# Patient Record
Sex: Male | Born: 1960
Health system: Southern US, Community
[De-identification: ages and names within clinical notes are randomized; demographics above are authoritative.]

## PROBLEM LIST (undated history)

## (undated) DIAGNOSIS — T883XXA Malignant hyperthermia due to anesthesia, initial encounter: Secondary | ICD-10-CM

## (undated) DIAGNOSIS — Z21 Asymptomatic human immunodeficiency virus [HIV] infection status: Secondary | ICD-10-CM

## (undated) DIAGNOSIS — F5104 Psychophysiologic insomnia: Secondary | ICD-10-CM

## (undated) DIAGNOSIS — I10 Essential (primary) hypertension: Secondary | ICD-10-CM

## (undated) DIAGNOSIS — B1920 Unspecified viral hepatitis C without hepatic coma: Secondary | ICD-10-CM

## (undated) DIAGNOSIS — Z5189 Encounter for other specified aftercare: Secondary | ICD-10-CM

## (undated) DIAGNOSIS — R569 Unspecified convulsions: Secondary | ICD-10-CM

## (undated) DIAGNOSIS — B20 Human immunodeficiency virus [HIV] disease: Secondary | ICD-10-CM

## (undated) DIAGNOSIS — N289 Disorder of kidney and ureter, unspecified: Secondary | ICD-10-CM

## (undated) HISTORY — DX: Unspecified convulsions: R56.9

## (undated) HISTORY — DX: Human immunodeficiency virus (HIV) disease: B20

## (undated) HISTORY — DX: Unspecified viral hepatitis C without hepatic coma: B19.20

## (undated) HISTORY — DX: Encounter for other specified aftercare: Z51.89

## (undated) HISTORY — DX: Psychophysiologic insomnia: F51.04

## (undated) HISTORY — PX: AV FISTULA PLACEMENT: SHX1204

## (undated) HISTORY — DX: Essential (primary) hypertension: I10

## (undated) HISTORY — PX: NEPHRECTOMY TRANSPLANTED ORGAN: SUR880

## (undated) HISTORY — DX: Asymptomatic human immunodeficiency virus (hiv) infection status: Z21

---

## 1998-02-06 ENCOUNTER — Emergency Department (HOSPITAL_COMMUNITY): Admission: EM | Admit: 1998-02-06 | Discharge: 1998-02-06 | Payer: Self-pay | Admitting: Emergency Medicine

## 1998-04-17 ENCOUNTER — Encounter: Payer: Self-pay | Admitting: Internal Medicine

## 1998-04-17 ENCOUNTER — Ambulatory Visit (HOSPITAL_COMMUNITY): Admission: RE | Admit: 1998-04-17 | Discharge: 1998-04-17 | Payer: Self-pay | Admitting: Internal Medicine

## 1999-05-10 DIAGNOSIS — B20 Human immunodeficiency virus [HIV] disease: Secondary | ICD-10-CM | POA: Insufficient documentation

## 1999-08-18 ENCOUNTER — Encounter: Admission: RE | Admit: 1999-08-18 | Discharge: 1999-08-18 | Payer: Self-pay | Admitting: Infectious Diseases

## 1999-08-18 ENCOUNTER — Ambulatory Visit (HOSPITAL_COMMUNITY): Admission: RE | Admit: 1999-08-18 | Discharge: 1999-08-18 | Payer: Self-pay | Admitting: Infectious Diseases

## 1999-08-18 ENCOUNTER — Encounter (INDEPENDENT_AMBULATORY_CARE_PROVIDER_SITE_OTHER): Payer: Self-pay | Admitting: *Deleted

## 1999-08-25 ENCOUNTER — Encounter: Admission: RE | Admit: 1999-08-25 | Discharge: 1999-08-25 | Payer: Self-pay | Admitting: Internal Medicine

## 1999-10-05 ENCOUNTER — Encounter: Admission: RE | Admit: 1999-10-05 | Discharge: 1999-10-05 | Payer: Self-pay | Admitting: Internal Medicine

## 1999-10-05 ENCOUNTER — Ambulatory Visit (HOSPITAL_COMMUNITY): Admission: RE | Admit: 1999-10-05 | Discharge: 1999-10-05 | Payer: Self-pay | Admitting: Internal Medicine

## 1999-10-08 DIAGNOSIS — I6789 Other cerebrovascular disease: Secondary | ICD-10-CM | POA: Insufficient documentation

## 1999-10-11 ENCOUNTER — Ambulatory Visit (HOSPITAL_COMMUNITY): Admission: RE | Admit: 1999-10-11 | Discharge: 1999-10-11 | Payer: Self-pay | Admitting: Vascular Surgery

## 1999-10-11 ENCOUNTER — Encounter: Payer: Self-pay | Admitting: Vascular Surgery

## 1999-10-14 ENCOUNTER — Encounter: Payer: Self-pay | Admitting: Internal Medicine

## 1999-10-14 ENCOUNTER — Inpatient Hospital Stay (HOSPITAL_COMMUNITY): Admission: EM | Admit: 1999-10-14 | Discharge: 1999-10-23 | Payer: Self-pay | Admitting: Emergency Medicine

## 1999-10-14 ENCOUNTER — Encounter: Payer: Self-pay | Admitting: Emergency Medicine

## 1999-10-15 ENCOUNTER — Encounter: Payer: Self-pay | Admitting: Internal Medicine

## 1999-10-16 ENCOUNTER — Encounter: Payer: Self-pay | Admitting: Internal Medicine

## 1999-10-19 ENCOUNTER — Encounter: Payer: Self-pay | Admitting: Internal Medicine

## 1999-10-19 ENCOUNTER — Encounter: Payer: Self-pay | Admitting: Thoracic Surgery

## 1999-10-21 ENCOUNTER — Encounter: Payer: Self-pay | Admitting: Internal Medicine

## 1999-10-25 ENCOUNTER — Encounter: Admission: RE | Admit: 1999-10-25 | Discharge: 1999-10-25 | Payer: Self-pay | Admitting: Internal Medicine

## 1999-10-25 ENCOUNTER — Ambulatory Visit (HOSPITAL_COMMUNITY): Admission: RE | Admit: 1999-10-25 | Discharge: 1999-10-25 | Payer: Self-pay | Admitting: Internal Medicine

## 1999-11-08 ENCOUNTER — Encounter: Admission: RE | Admit: 1999-11-08 | Discharge: 1999-11-08 | Payer: Self-pay | Admitting: Internal Medicine

## 1999-11-15 ENCOUNTER — Encounter: Admission: RE | Admit: 1999-11-15 | Discharge: 2000-02-13 | Payer: Self-pay | Admitting: Internal Medicine

## 1999-12-03 ENCOUNTER — Ambulatory Visit (HOSPITAL_COMMUNITY): Admission: RE | Admit: 1999-12-03 | Discharge: 1999-12-03 | Payer: Self-pay | Admitting: Vascular Surgery

## 1999-12-10 ENCOUNTER — Encounter: Admission: RE | Admit: 1999-12-10 | Discharge: 1999-12-10 | Payer: Self-pay | Admitting: Internal Medicine

## 1999-12-29 ENCOUNTER — Encounter: Admission: RE | Admit: 1999-12-29 | Discharge: 1999-12-29 | Payer: Self-pay | Admitting: Hematology and Oncology

## 2000-01-04 ENCOUNTER — Ambulatory Visit (HOSPITAL_COMMUNITY): Admission: RE | Admit: 2000-01-04 | Discharge: 2000-01-04 | Payer: Self-pay | Admitting: Infectious Diseases

## 2000-01-04 ENCOUNTER — Encounter: Admission: RE | Admit: 2000-01-04 | Discharge: 2000-01-04 | Payer: Self-pay | Admitting: Internal Medicine

## 2000-01-25 ENCOUNTER — Encounter: Admission: RE | Admit: 2000-01-25 | Discharge: 2000-01-25 | Payer: Self-pay | Admitting: Internal Medicine

## 2000-02-10 ENCOUNTER — Emergency Department (HOSPITAL_COMMUNITY): Admission: EM | Admit: 2000-02-10 | Discharge: 2000-02-10 | Payer: Self-pay | Admitting: Emergency Medicine

## 2000-03-17 ENCOUNTER — Encounter: Admission: RE | Admit: 2000-03-17 | Discharge: 2000-03-17 | Payer: Self-pay | Admitting: Internal Medicine

## 2000-07-24 ENCOUNTER — Encounter: Admission: RE | Admit: 2000-07-24 | Discharge: 2000-07-24 | Payer: Self-pay | Admitting: Internal Medicine

## 2000-07-24 ENCOUNTER — Ambulatory Visit (HOSPITAL_COMMUNITY): Admission: RE | Admit: 2000-07-24 | Discharge: 2000-07-24 | Payer: Self-pay | Admitting: Internal Medicine

## 2000-10-17 ENCOUNTER — Ambulatory Visit (HOSPITAL_COMMUNITY): Admission: RE | Admit: 2000-10-17 | Discharge: 2000-10-17 | Payer: Self-pay | Admitting: Internal Medicine

## 2000-11-08 ENCOUNTER — Encounter: Admission: RE | Admit: 2000-11-08 | Discharge: 2000-11-08 | Payer: Self-pay | Admitting: Internal Medicine

## 2001-05-30 ENCOUNTER — Ambulatory Visit (HOSPITAL_COMMUNITY): Admission: RE | Admit: 2001-05-30 | Discharge: 2001-05-30 | Payer: Self-pay | Admitting: Internal Medicine

## 2001-05-30 ENCOUNTER — Encounter: Admission: RE | Admit: 2001-05-30 | Discharge: 2001-05-30 | Payer: Self-pay | Admitting: Internal Medicine

## 2001-08-06 ENCOUNTER — Encounter: Admission: RE | Admit: 2001-08-06 | Discharge: 2001-08-06 | Payer: Self-pay | Admitting: Infectious Diseases

## 2001-08-06 ENCOUNTER — Ambulatory Visit (HOSPITAL_COMMUNITY): Admission: RE | Admit: 2001-08-06 | Discharge: 2001-08-06 | Payer: Self-pay | Admitting: Internal Medicine

## 2001-10-23 ENCOUNTER — Encounter: Admission: RE | Admit: 2001-10-23 | Discharge: 2001-10-23 | Payer: Self-pay | Admitting: Internal Medicine

## 2001-10-23 ENCOUNTER — Ambulatory Visit (HOSPITAL_COMMUNITY): Admission: RE | Admit: 2001-10-23 | Discharge: 2001-10-23 | Payer: Self-pay | Admitting: Internal Medicine

## 2002-04-16 ENCOUNTER — Ambulatory Visit (HOSPITAL_COMMUNITY): Admission: RE | Admit: 2002-04-16 | Discharge: 2002-04-16 | Payer: Self-pay | Admitting: Internal Medicine

## 2002-04-16 ENCOUNTER — Encounter: Admission: RE | Admit: 2002-04-16 | Discharge: 2002-04-16 | Payer: Self-pay | Admitting: Internal Medicine

## 2002-05-09 HISTORY — PX: COLONOSCOPY: SHX174

## 2002-05-22 ENCOUNTER — Ambulatory Visit (HOSPITAL_COMMUNITY): Admission: RE | Admit: 2002-05-22 | Discharge: 2002-05-22 | Payer: Self-pay | Admitting: Gastroenterology

## 2002-06-14 ENCOUNTER — Encounter (INDEPENDENT_AMBULATORY_CARE_PROVIDER_SITE_OTHER): Payer: Self-pay | Admitting: Specialist

## 2002-06-14 ENCOUNTER — Encounter: Payer: Self-pay | Admitting: Surgery

## 2002-06-14 ENCOUNTER — Inpatient Hospital Stay (HOSPITAL_COMMUNITY): Admission: RE | Admit: 2002-06-14 | Discharge: 2002-06-18 | Payer: Self-pay | Admitting: Surgery

## 2002-08-08 DIAGNOSIS — Z9089 Acquired absence of other organs: Secondary | ICD-10-CM | POA: Insufficient documentation

## 2002-11-27 ENCOUNTER — Encounter: Payer: Self-pay | Admitting: Internal Medicine

## 2002-11-27 ENCOUNTER — Ambulatory Visit (HOSPITAL_COMMUNITY): Admission: RE | Admit: 2002-11-27 | Discharge: 2002-11-27 | Payer: Self-pay | Admitting: Internal Medicine

## 2002-11-27 ENCOUNTER — Encounter: Admission: RE | Admit: 2002-11-27 | Discharge: 2002-11-27 | Payer: Self-pay | Admitting: Internal Medicine

## 2002-12-17 ENCOUNTER — Encounter: Admission: RE | Admit: 2002-12-17 | Discharge: 2002-12-17 | Payer: Self-pay | Admitting: Internal Medicine

## 2002-12-23 ENCOUNTER — Emergency Department (HOSPITAL_COMMUNITY): Admission: EM | Admit: 2002-12-23 | Discharge: 2002-12-23 | Payer: Self-pay | Admitting: Emergency Medicine

## 2002-12-31 ENCOUNTER — Encounter: Admission: RE | Admit: 2002-12-31 | Discharge: 2002-12-31 | Payer: Self-pay | Admitting: Nephrology

## 2002-12-31 ENCOUNTER — Encounter: Payer: Self-pay | Admitting: Nephrology

## 2003-01-03 ENCOUNTER — Inpatient Hospital Stay (HOSPITAL_COMMUNITY): Admission: EM | Admit: 2003-01-03 | Discharge: 2003-01-04 | Payer: Self-pay | Admitting: Emergency Medicine

## 2003-01-07 ENCOUNTER — Encounter: Admission: RE | Admit: 2003-01-07 | Discharge: 2003-01-07 | Payer: Self-pay | Admitting: Nephrology

## 2003-01-07 ENCOUNTER — Encounter: Payer: Self-pay | Admitting: Nephrology

## 2003-01-08 ENCOUNTER — Encounter: Payer: Self-pay | Admitting: Nephrology

## 2003-01-08 ENCOUNTER — Encounter: Admission: RE | Admit: 2003-01-08 | Discharge: 2003-01-08 | Payer: Self-pay | Admitting: Nephrology

## 2003-07-31 ENCOUNTER — Ambulatory Visit (HOSPITAL_COMMUNITY): Admission: RE | Admit: 2003-07-31 | Discharge: 2003-07-31 | Payer: Self-pay | Admitting: Internal Medicine

## 2003-07-31 ENCOUNTER — Encounter: Admission: RE | Admit: 2003-07-31 | Discharge: 2003-07-31 | Payer: Self-pay | Admitting: Internal Medicine

## 2003-08-29 ENCOUNTER — Ambulatory Visit (HOSPITAL_COMMUNITY): Admission: RE | Admit: 2003-08-29 | Discharge: 2003-08-29 | Payer: Self-pay | Admitting: Nephrology

## 2004-03-30 ENCOUNTER — Ambulatory Visit: Payer: Self-pay | Admitting: Internal Medicine

## 2004-03-30 ENCOUNTER — Ambulatory Visit (HOSPITAL_COMMUNITY): Admission: RE | Admit: 2004-03-30 | Discharge: 2004-03-30 | Payer: Self-pay | Admitting: Internal Medicine

## 2004-03-30 ENCOUNTER — Encounter (INDEPENDENT_AMBULATORY_CARE_PROVIDER_SITE_OTHER): Payer: Self-pay | Admitting: *Deleted

## 2004-12-09 ENCOUNTER — Encounter (HOSPITAL_COMMUNITY): Admission: RE | Admit: 2004-12-09 | Discharge: 2005-03-09 | Payer: Self-pay | Admitting: Nephrology

## 2005-03-22 ENCOUNTER — Ambulatory Visit: Payer: Self-pay | Admitting: Internal Medicine

## 2005-03-22 ENCOUNTER — Encounter (INDEPENDENT_AMBULATORY_CARE_PROVIDER_SITE_OTHER): Payer: Self-pay | Admitting: *Deleted

## 2005-03-22 ENCOUNTER — Ambulatory Visit (HOSPITAL_COMMUNITY): Admission: RE | Admit: 2005-03-22 | Discharge: 2005-03-22 | Payer: Self-pay | Admitting: Internal Medicine

## 2005-03-22 LAB — CONVERTED CEMR LAB: HIV 1 RNA Quant: 399 copies/mL

## 2005-03-28 ENCOUNTER — Ambulatory Visit (HOSPITAL_COMMUNITY): Admission: RE | Admit: 2005-03-28 | Discharge: 2005-03-28 | Payer: Self-pay | Admitting: Nephrology

## 2005-09-15 ENCOUNTER — Encounter (INDEPENDENT_AMBULATORY_CARE_PROVIDER_SITE_OTHER): Payer: Self-pay | Admitting: *Deleted

## 2005-09-15 LAB — CONVERTED CEMR LAB
CD4 Count: 450 microliters
HIV 1 RNA Quant: 49 copies/mL

## 2005-10-18 ENCOUNTER — Ambulatory Visit: Payer: Self-pay | Admitting: Internal Medicine

## 2006-03-15 DIAGNOSIS — I10 Essential (primary) hypertension: Secondary | ICD-10-CM | POA: Insufficient documentation

## 2006-03-15 DIAGNOSIS — B1920 Unspecified viral hepatitis C without hepatic coma: Secondary | ICD-10-CM | POA: Insufficient documentation

## 2006-03-15 DIAGNOSIS — Z8619 Personal history of other infectious and parasitic diseases: Secondary | ICD-10-CM | POA: Insufficient documentation

## 2006-03-15 DIAGNOSIS — F1021 Alcohol dependence, in remission: Secondary | ICD-10-CM | POA: Insufficient documentation

## 2006-03-15 DIAGNOSIS — Z87891 Personal history of nicotine dependence: Secondary | ICD-10-CM | POA: Insufficient documentation

## 2006-03-15 DIAGNOSIS — R569 Unspecified convulsions: Secondary | ICD-10-CM | POA: Insufficient documentation

## 2006-03-15 DIAGNOSIS — N186 End stage renal disease: Secondary | ICD-10-CM | POA: Insufficient documentation

## 2006-03-15 DIAGNOSIS — N433 Hydrocele, unspecified: Secondary | ICD-10-CM | POA: Insufficient documentation

## 2006-03-15 DIAGNOSIS — A539 Syphilis, unspecified: Secondary | ICD-10-CM | POA: Insufficient documentation

## 2006-03-15 HISTORY — DX: Unspecified convulsions: R56.9

## 2006-03-25 ENCOUNTER — Emergency Department (HOSPITAL_COMMUNITY): Admission: EM | Admit: 2006-03-25 | Discharge: 2006-03-25 | Payer: Self-pay | Admitting: Emergency Medicine

## 2006-04-27 ENCOUNTER — Emergency Department (HOSPITAL_COMMUNITY): Admission: EM | Admit: 2006-04-27 | Discharge: 2006-04-27 | Payer: Self-pay | Admitting: Emergency Medicine

## 2006-05-22 ENCOUNTER — Encounter (HOSPITAL_COMMUNITY): Admission: RE | Admit: 2006-05-22 | Discharge: 2006-07-19 | Payer: Self-pay | Admitting: Nephrology

## 2006-07-03 ENCOUNTER — Encounter (INDEPENDENT_AMBULATORY_CARE_PROVIDER_SITE_OTHER): Payer: Self-pay | Admitting: *Deleted

## 2006-07-03 LAB — CONVERTED CEMR LAB: HCV Quantitative: 258200 intl units/mL

## 2006-07-11 ENCOUNTER — Telehealth (INDEPENDENT_AMBULATORY_CARE_PROVIDER_SITE_OTHER): Payer: Self-pay | Admitting: *Deleted

## 2006-07-16 ENCOUNTER — Encounter (INDEPENDENT_AMBULATORY_CARE_PROVIDER_SITE_OTHER): Payer: Self-pay | Admitting: *Deleted

## 2006-08-22 ENCOUNTER — Ambulatory Visit: Payer: Self-pay | Admitting: Internal Medicine

## 2006-08-22 ENCOUNTER — Encounter: Admission: RE | Admit: 2006-08-22 | Discharge: 2006-08-22 | Payer: Self-pay | Admitting: Internal Medicine

## 2006-08-22 LAB — CONVERTED CEMR LAB
AST: 11 units/L (ref 0–37)
Alkaline Phosphatase: 91 units/L (ref 39–117)
BUN: 21 mg/dL (ref 6–23)
Basophils Relative: 0 % (ref 0–1)
Calcium: 9.7 mg/dL (ref 8.4–10.5)
Chloride: 101 meq/L (ref 96–112)
Creatinine, Ser: 6.62 mg/dL — ABNORMAL HIGH (ref 0.40–1.50)
Eosinophils Absolute: 0 10*3/uL (ref 0.0–0.7)
Eosinophils Relative: 1 % (ref 0–5)
HCT: 40.9 % (ref 39.0–52.0)
HDL: 67 mg/dL (ref >39)
Hemoglobin: 12.4 g/dL — ABNORMAL LOW (ref 13.0–17.0)
MCHC: 30.3 g/dL (ref 30.0–36.0)
MCV: 127 fL — ABNORMAL HIGH (ref 78.0–100.0)
Monocytes Absolute: 0.5 10*3/uL (ref 0.2–0.7)
Monocytes Relative: 11 % (ref 3–11)
RBC: 3.22 M/uL — ABNORMAL LOW (ref 4.22–5.81)
RDW: 14.6 % — ABNORMAL HIGH (ref 11.5–14.0)
Total Bilirubin: 0.4 mg/dL (ref 0.3–1.2)
Total CHOL/HDL Ratio: 2
VLDL: 24 mg/dL (ref 0–40)

## 2006-10-06 ENCOUNTER — Telehealth: Payer: Self-pay | Admitting: Internal Medicine

## 2006-11-06 ENCOUNTER — Telehealth: Payer: Self-pay | Admitting: Internal Medicine

## 2006-11-24 ENCOUNTER — Ambulatory Visit: Payer: Self-pay | Admitting: Vascular Surgery

## 2006-12-18 ENCOUNTER — Ambulatory Visit: Payer: Self-pay | Admitting: Infectious Diseases

## 2006-12-19 ENCOUNTER — Inpatient Hospital Stay (HOSPITAL_COMMUNITY): Admission: AD | Admit: 2006-12-19 | Discharge: 2006-12-25 | Payer: Self-pay | Admitting: Vascular Surgery

## 2006-12-19 ENCOUNTER — Encounter: Payer: Self-pay | Admitting: Vascular Surgery

## 2006-12-20 IMAGING — CR DG CHEST 2V
2 series · 2 of 2 positions shown · non-contrast
Comparison: [DATE].

CLINICAL DATA: End stage renal disease.  Question early pneumonia right lung base on prior film. 
 CHEST - 2 VIEW:

[w chest pa]
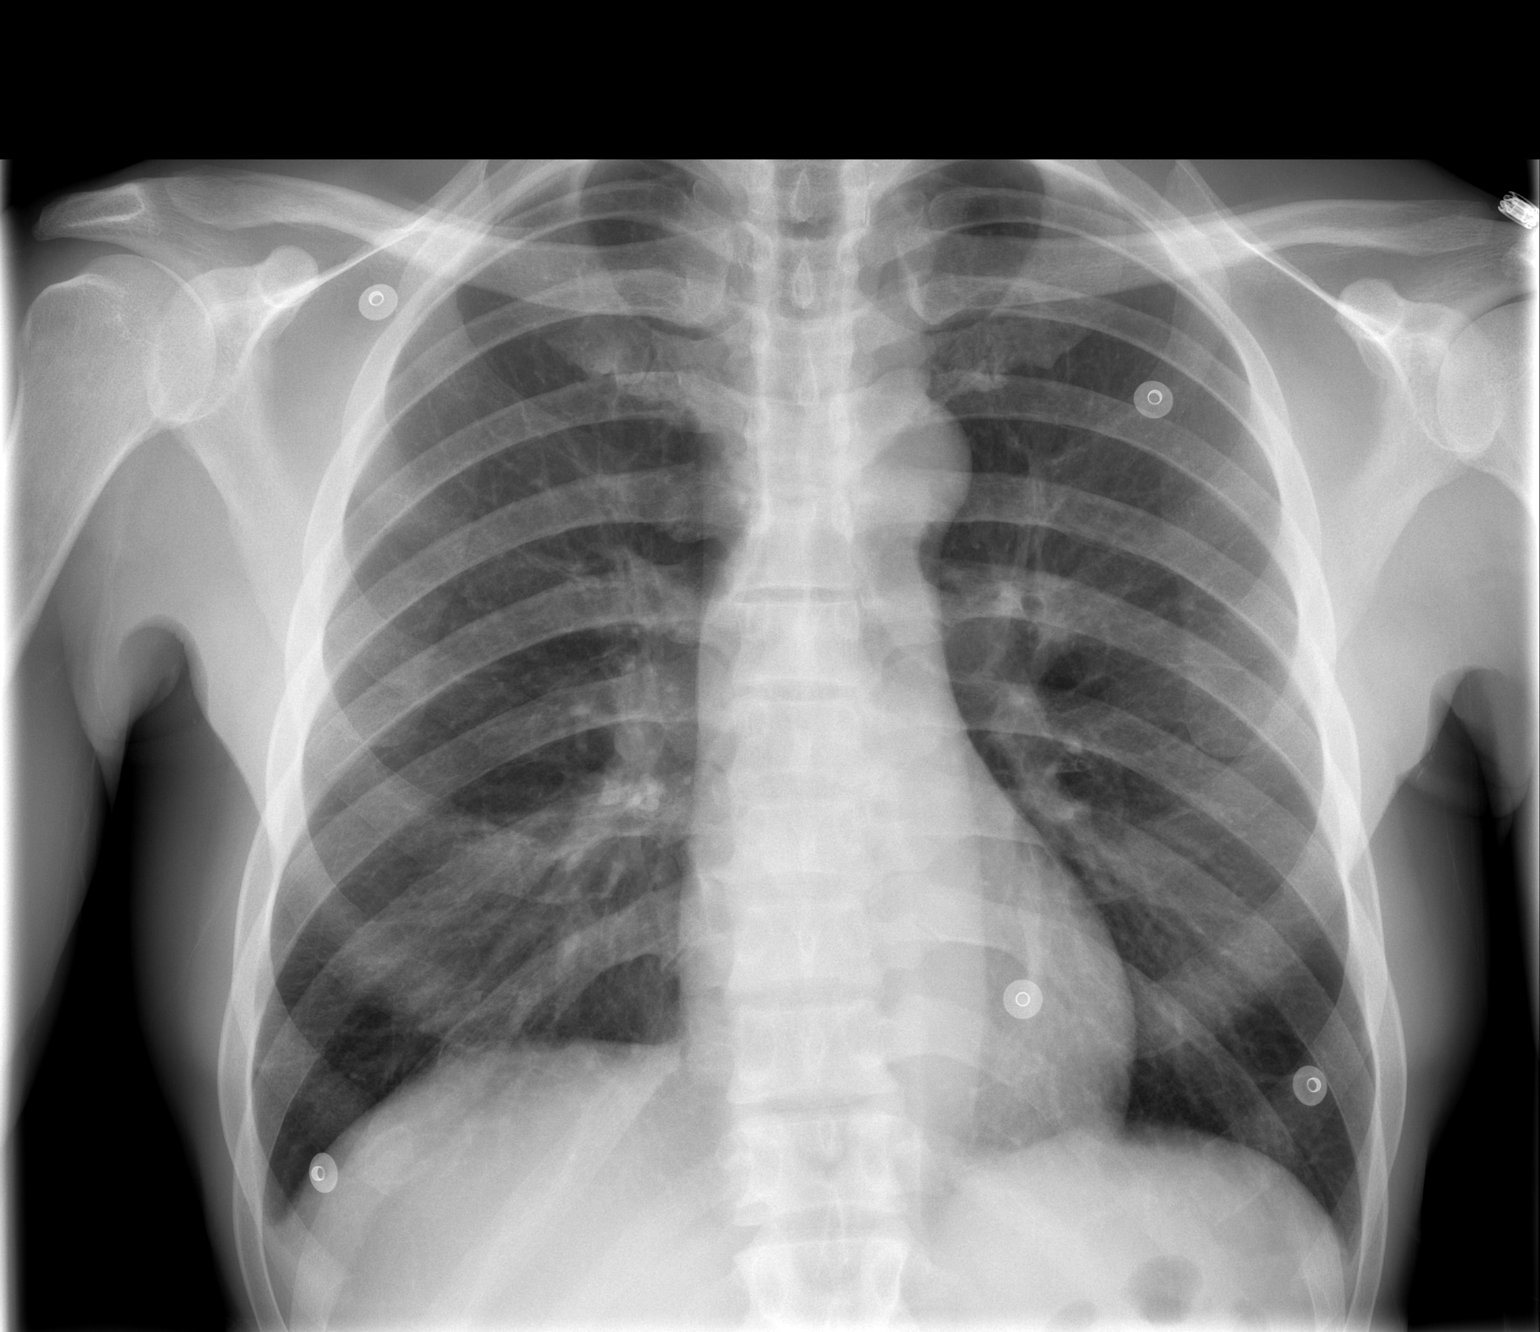

[w chest lat]
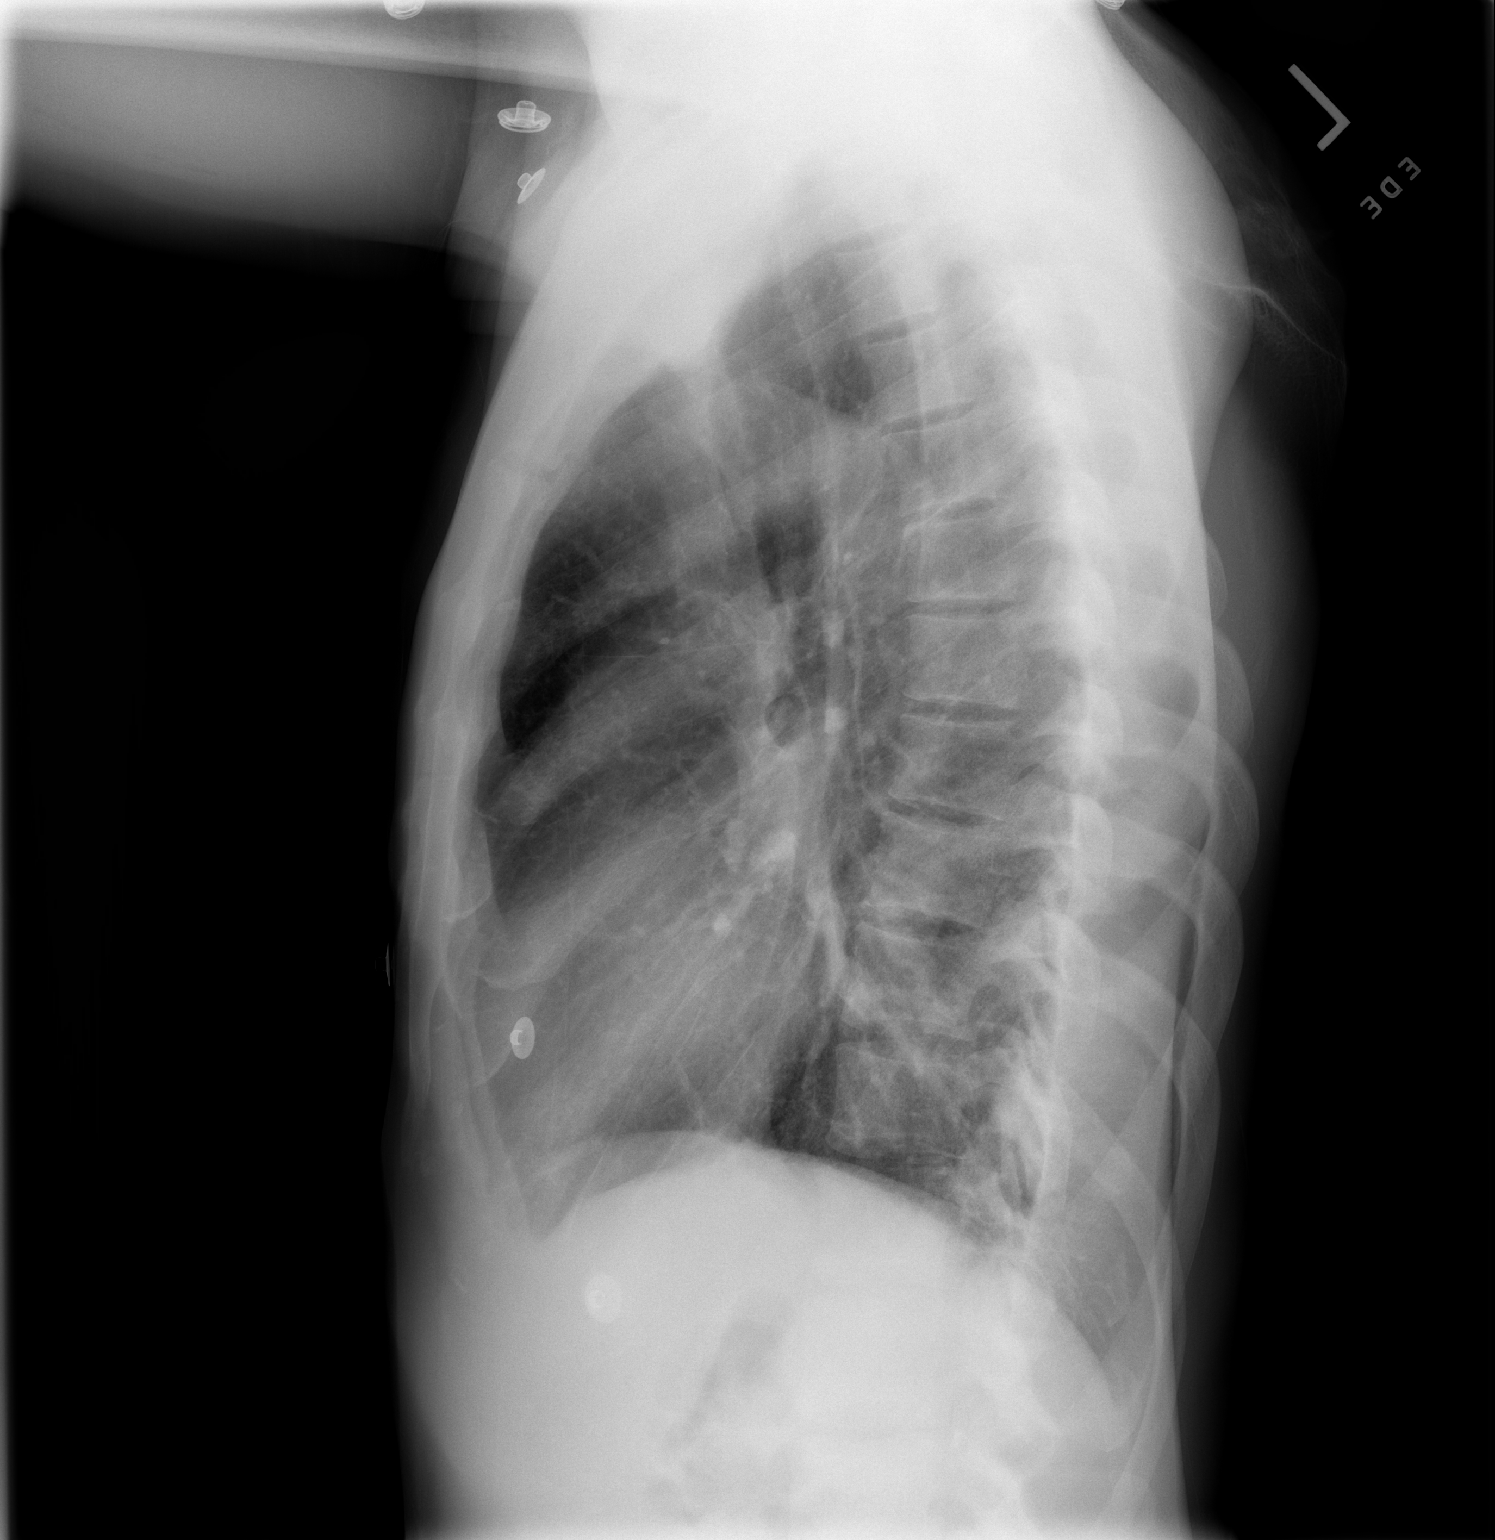

[2 of 2 positions shown; findings below may reference images not displayed]

FINDINGS: Two views of the chest show no definite pneumonia.  Aeration of the right base has improved.  No effusion is seen.
IMPRESSION: No definite pneumonia.

## 2006-12-29 ENCOUNTER — Encounter: Payer: Self-pay | Admitting: Internal Medicine

## 2007-01-09 ENCOUNTER — Encounter (HOSPITAL_COMMUNITY): Admission: RE | Admit: 2007-01-09 | Discharge: 2007-03-16 | Payer: Self-pay | Admitting: Nephrology

## 2007-01-17 ENCOUNTER — Ambulatory Visit: Payer: Self-pay | Admitting: Vascular Surgery

## 2007-02-12 ENCOUNTER — Ambulatory Visit: Payer: Self-pay | Admitting: Vascular Surgery

## 2007-02-12 ENCOUNTER — Inpatient Hospital Stay (HOSPITAL_COMMUNITY): Admission: RE | Admit: 2007-02-12 | Discharge: 2007-02-13 | Payer: Self-pay | Admitting: Vascular Surgery

## 2007-02-20 ENCOUNTER — Encounter: Payer: Self-pay | Admitting: Internal Medicine

## 2007-02-28 ENCOUNTER — Ambulatory Visit: Payer: Self-pay | Admitting: Vascular Surgery

## 2007-03-14 ENCOUNTER — Ambulatory Visit: Payer: Self-pay | Admitting: Vascular Surgery

## 2007-05-28 ENCOUNTER — Telehealth: Payer: Self-pay | Admitting: Internal Medicine

## 2007-06-18 ENCOUNTER — Ambulatory Visit: Payer: Self-pay | Admitting: Dentistry

## 2007-06-18 ENCOUNTER — Encounter: Payer: Self-pay | Admitting: Internal Medicine

## 2007-06-18 ENCOUNTER — Encounter: Admission: AD | Admit: 2007-06-18 | Discharge: 2007-06-18 | Payer: Self-pay | Admitting: Dentistry

## 2007-06-27 ENCOUNTER — Encounter: Admission: RE | Admit: 2007-06-27 | Discharge: 2007-06-27 | Payer: Self-pay | Admitting: Internal Medicine

## 2007-06-27 ENCOUNTER — Ambulatory Visit: Payer: Self-pay | Admitting: Internal Medicine

## 2007-06-27 LAB — CONVERTED CEMR LAB
Alkaline Phosphatase: 76 units/L (ref 39–117)
BUN: 44 mg/dL — ABNORMAL HIGH (ref 6–23)
Eosinophils Absolute: 0.1 10*3/uL (ref 0.0–0.7)
Eosinophils Relative: 3 % (ref 0–5)
Glucose, Bld: 88 mg/dL (ref 70–99)
HCT: 30.8 % — ABNORMAL LOW (ref 39.0–52.0)
HDL: 70 mg/dL (ref >39)
LDL Cholesterol: 47 mg/dL (ref 0–99)
Lymphs Abs: 0.9 10*3/uL (ref 0.7–4.0)
MCV: 117.1 fL — ABNORMAL HIGH (ref 78.0–100.0)
Monocytes Relative: 10 % (ref 3–12)
Platelets: 207 10*3/uL (ref 150–400)
RBC: 2.63 M/uL — ABNORMAL LOW (ref 4.22–5.81)
Total Bilirubin: 0.5 mg/dL (ref 0.3–1.2)
Total CHOL/HDL Ratio: 1.8
Triglycerides: 62 mg/dL (ref <150)
VLDL: 12 mg/dL (ref 0–40)
WBC: 2.8 10*3/uL — ABNORMAL LOW (ref 4.0–10.5)

## 2007-07-05 ENCOUNTER — Encounter (INDEPENDENT_AMBULATORY_CARE_PROVIDER_SITE_OTHER): Payer: Self-pay | Admitting: *Deleted

## 2007-07-11 ENCOUNTER — Telehealth: Payer: Self-pay | Admitting: Internal Medicine

## 2007-07-11 DIAGNOSIS — Z940 Kidney transplant status: Secondary | ICD-10-CM | POA: Insufficient documentation

## 2007-08-03 ENCOUNTER — Encounter: Payer: Self-pay | Admitting: Internal Medicine

## 2007-10-05 ENCOUNTER — Telehealth: Payer: Self-pay | Admitting: Licensed Clinical Social Worker

## 2007-10-08 ENCOUNTER — Encounter (INDEPENDENT_AMBULATORY_CARE_PROVIDER_SITE_OTHER): Payer: Self-pay | Admitting: *Deleted

## 2007-10-16 ENCOUNTER — Encounter (INDEPENDENT_AMBULATORY_CARE_PROVIDER_SITE_OTHER): Payer: Self-pay | Admitting: *Deleted

## 2007-11-04 ENCOUNTER — Emergency Department (HOSPITAL_COMMUNITY): Admission: EM | Admit: 2007-11-04 | Discharge: 2007-11-04 | Payer: Self-pay | Admitting: Emergency Medicine

## 2008-02-04 ENCOUNTER — Emergency Department (HOSPITAL_COMMUNITY): Admission: EM | Admit: 2008-02-04 | Discharge: 2008-02-04 | Payer: Self-pay | Admitting: *Deleted

## 2008-08-29 ENCOUNTER — Encounter (INDEPENDENT_AMBULATORY_CARE_PROVIDER_SITE_OTHER): Payer: Self-pay | Admitting: *Deleted

## 2008-09-18 ENCOUNTER — Encounter: Payer: Self-pay | Admitting: Internal Medicine

## 2009-02-23 ENCOUNTER — Encounter: Payer: Self-pay | Admitting: Internal Medicine

## 2009-03-10 ENCOUNTER — Ambulatory Visit: Payer: Self-pay | Admitting: Internal Medicine

## 2009-03-10 LAB — CONVERTED CEMR LAB
AST: 20 units/L (ref 0–37)
BUN: 22 mg/dL (ref 6–23)
Basophils Absolute: 0 10*3/uL (ref 0.0–0.1)
Calcium: 7.4 mg/dL — ABNORMAL LOW (ref 8.4–10.5)
Chloride: 106 meq/L (ref 96–112)
Creatinine, Ser: 1.49 mg/dL (ref 0.40–1.50)
Eosinophils Absolute: 0 10*3/uL (ref 0.0–0.7)
Eosinophils Relative: 1 % (ref 0–5)
HCT: 40.4 % (ref 39.0–52.0)
HIV 1 RNA Quant: 104 copies/mL — ABNORMAL HIGH (ref <48)
HIV-1 RNA Quant, Log: 2.02 — ABNORMAL HIGH (ref <1.68)
Lymphs Abs: 2.3 10*3/uL (ref 0.7–4.0)
MCV: 100.2 fL — ABNORMAL HIGH (ref >=78.0)
Platelets: 214 10*3/uL (ref 150–400)
RDW: 13.1 % (ref 11.5–15.5)

## 2009-03-24 ENCOUNTER — Ambulatory Visit: Payer: Self-pay | Admitting: Internal Medicine

## 2009-03-24 LAB — CONVERTED CEMR LAB
Cholesterol: 139 mg/dL (ref 0–200)
Total CHOL/HDL Ratio: 2.3

## 2009-04-07 ENCOUNTER — Encounter: Payer: Self-pay | Admitting: Internal Medicine

## 2009-06-22 ENCOUNTER — Encounter: Payer: Self-pay | Admitting: Internal Medicine

## 2009-07-22 ENCOUNTER — Encounter (INDEPENDENT_AMBULATORY_CARE_PROVIDER_SITE_OTHER): Payer: Self-pay | Admitting: *Deleted

## 2009-07-28 ENCOUNTER — Encounter (INDEPENDENT_AMBULATORY_CARE_PROVIDER_SITE_OTHER): Payer: Self-pay | Admitting: *Deleted

## 2009-08-17 ENCOUNTER — Ambulatory Visit: Payer: Self-pay | Admitting: Internal Medicine

## 2009-08-17 ENCOUNTER — Ambulatory Visit: Payer: Self-pay | Admitting: Dentistry

## 2009-08-17 ENCOUNTER — Encounter: Admission: RE | Admit: 2009-08-17 | Discharge: 2009-08-17 | Payer: Self-pay | Admitting: Dentistry

## 2009-08-17 LAB — CONVERTED CEMR LAB
HIV 1 RNA Quant: <48 copies/mL (ref <48)
HIV-1 RNA Quant, Log: <1.68 (ref <1.68)

## 2009-09-01 ENCOUNTER — Ambulatory Visit: Payer: Self-pay | Admitting: Internal Medicine

## 2009-09-01 LAB — CONVERTED CEMR LAB
ALT: 24 units/L (ref 0–53)
Albumin: 4 g/dL (ref 3.5–5.2)
CO2: 23 meq/L (ref 19–32)
Cholesterol: 173 mg/dL (ref 0–200)
Glucose, Bld: 86 mg/dL (ref 70–99)
Hemoglobin: 13.2 g/dL (ref 13.0–17.0)
LDL Cholesterol: 63 mg/dL (ref 0–99)
Lymphocytes Relative: 43 % (ref 12–46)
Lymphs Abs: 2 10*3/uL (ref 0.7–4.0)
Monocytes Absolute: 0.4 10*3/uL (ref 0.1–1.0)
Monocytes Relative: 9 % (ref 3–12)
Neutro Abs: 2.2 10*3/uL (ref 1.7–7.7)
Potassium: 4.7 meq/L (ref 3.5–5.3)
RBC: 4.05 M/uL — ABNORMAL LOW (ref 4.22–5.81)
Sodium: 138 meq/L (ref 135–145)
Total Protein: 7 g/dL (ref 6.0–8.3)
Triglycerides: 76 mg/dL (ref <150)
WBC: 4.7 10*3/uL (ref 4.0–10.5)

## 2009-09-21 ENCOUNTER — Emergency Department (HOSPITAL_COMMUNITY): Admission: EM | Admit: 2009-09-21 | Discharge: 2009-09-21 | Payer: Self-pay | Admitting: Emergency Medicine

## 2009-10-07 ENCOUNTER — Encounter: Payer: Self-pay | Admitting: Internal Medicine

## 2010-02-02 ENCOUNTER — Emergency Department (HOSPITAL_COMMUNITY): Admission: EM | Admit: 2010-02-02 | Discharge: 2010-02-02 | Payer: Self-pay | Admitting: Emergency Medicine

## 2010-02-19 ENCOUNTER — Emergency Department (HOSPITAL_COMMUNITY): Admission: EM | Admit: 2010-02-19 | Discharge: 2010-02-20 | Payer: Self-pay | Admitting: Emergency Medicine

## 2010-02-25 ENCOUNTER — Encounter: Payer: Self-pay | Admitting: Internal Medicine

## 2010-03-18 ENCOUNTER — Emergency Department (HOSPITAL_COMMUNITY): Admission: EM | Admit: 2010-03-18 | Discharge: 2010-03-18 | Payer: Self-pay | Admitting: Emergency Medicine

## 2010-03-20 ENCOUNTER — Emergency Department (HOSPITAL_COMMUNITY): Admission: EM | Admit: 2010-03-20 | Discharge: 2010-03-20 | Payer: Self-pay | Admitting: Emergency Medicine

## 2010-03-21 ENCOUNTER — Emergency Department (HOSPITAL_COMMUNITY): Admission: EM | Admit: 2010-03-21 | Discharge: 2010-03-21 | Payer: Self-pay | Admitting: Emergency Medicine

## 2010-04-21 ENCOUNTER — Encounter (INDEPENDENT_AMBULATORY_CARE_PROVIDER_SITE_OTHER): Payer: Self-pay | Admitting: *Deleted

## 2010-05-30 ENCOUNTER — Encounter: Payer: Self-pay | Admitting: Nephrology

## 2010-06-08 NOTE — Miscellaneous (Signed)
Summary: Pt denied ADAP for 2011.  Now has Medicaid  Clinical Lists Changes  Observations: Added new observation of [place]: Medicaid (07/28/2009 12:36) Added new observation of MEDICARINSUR: Medicare (07/28/2009 12:36) Added new observation of PAYOR: More than 1 (07/28/2009 12:36) Added new observation of AIDSDAP: No (07/28/2009 12:36)     spoke to patient to let him know he was denied for ADAP because he is Medicaid eligible.  Patient informed me that he did receive the Medicaid approval letter and will bring in a copy of his new card.  From there we will discuss where patient wants to obtain his medications. Luis Reed  BS,CPht II,MPH  July 28, 2009 12:38 PM

## 2010-06-08 NOTE — Consult Note (Signed)
Summary: [facility]   [facility]   Imported By: Florinda Marker 08/19/2009 16:03:00  _____________________________________________________________________  External Attachment:    Type:   Image     Comment:   External Document

## 2010-06-08 NOTE — Consult Note (Signed)
Summary: [facility]   [facility]   Imported By: Florinda Marker 10/28/2009 15:34:09  _____________________________________________________________________  External Attachment:    Type:   Image     Comment:   External Document

## 2010-06-08 NOTE — Miscellaneous (Signed)
Summary: clincial update/ryan white [place]AP app completed  Clinical Lists Changes  Observations: Added new observation of PCTFPL: 80.18  (07/22/2009 12:26) Added new observation of FAMILYSIZE: 2  (07/22/2009 12:26) Added new observation of HOUSEINCOME: 16109  (07/22/2009 12:26) Added new observation of FINASSESSDT: 07/06/2009  (07/22/2009 12:26)

## 2010-06-08 NOTE — Miscellaneous (Signed)
Summary: Orders Update - labs  Clinical Lists Changes  Orders: Added new Test order of T-CD4SP [facility]) (CD4SP) - Signed Added new Test order of T-HIV Viral Load [phone]) - Signed     Process Orders Check Orders Results:     [facility]: Check successful Order queued for requisitioning for [facility]: August 17, 2009 11:11 AM  Tests Sent for requisitioning (August 17, 2009 11:11 AM):     08/17/2009: [facility] -- T-HIV Viral Load [phone] (signed)

## 2010-06-08 NOTE — Consult Note (Signed)
Summary: [facility]  [facility]   Imported By: Florinda Marker 03/12/2010 11:52:42  _____________________________________________________________________  External Attachment:    Type:   Image     Comment:   External Document

## 2010-06-08 NOTE — Assessment & Plan Note (Signed)
Summary: F/U [MKJ]   CC:  follow-up visit.  History of Present Illness: Luis Reed is in for his first visit since November.  He was supposed to return quickly after that visit so I can readjust his HIV medications based on his renal function had improved after renal transplant.  He is still taking the same regimen as before but is incorrectly taking Isentress only once daily and Retrovir only once daily.  He denies missing any doses.  His doses of Prograf and prednisone have been readjusted.  He says that he's feeling extremely well and happy to be off hemodialysis.  Preventive Screening-Counseling & Management  Alcohol-Tobacco     Alcohol drinks/day: 0     Smoking Status: quit     Year Quit: 1985  Caffeine-Diet-Exercise     Caffeine use/day: yes     Does Patient Exercise: yes     Type of exercise: basketball, football, soccer     Exercise (avg: min/session): >60     Times/week: <3  Hep-HIV-STD-Contraception     HIV Risk: no risk noted  Safety-Violence-Falls     Seat Belt Use: yes  Comments: declined condoms      Sexual History:  currently monogamous.        Drug Use:  former.     Updated Prior Medication List: ISENTRESS 400 MG TABS (RALTEGRAVIR POTASSIUM) Take 1 tablet by mouth two times a day (has only been taking this once daily) RETROVIR 300 MG TABS (ZIDOVUDINE) Take 1 tablet by mouth at bedtime (has only been taking this once daily) SUSTIVA 600 MG TABS (EFAVIRENZ) Take 1 tablet by mouth at bedtime PROGRAF 5 MG CAPS (TACROLIMUS) Take 1 tablet by mouth two times a day PROGRAF 1 MG CAPS (TACROLIMUS) Take 3 tablets by mouth two times a day PREDNISONE 5 MG TABS (PREDNISONE) Take 1 tablet by mouth every M, W, and F SEPTRA DS 800-160 MG TABS (SULFAMETHOXAZOLE-TRIMETHOPRIM) Take 1 tablet by mouth every M, W, and F  Current Allergies (reviewed today): ! HYDROCODONE Social History: Drug Use:  former  Vital Signs:  Patient profile:   50 year old male Height:      68 inches  (172.72 cm) Weight:      153.75 pounds (69.89 kg) BMI:     23.46 Temp:     98.0 degrees F (36.67 degrees C) oral Pulse rate:   76 / minute BP sitting:   130 / 87  (right arm) Cuff size:   large  Vitals Entered By: Jennet Maduro RN (September 01, 2009 10:36 AM) CC: follow-up visit Is Patient Diabetic? No Pain Assessment Patient in pain? no      Nutritional Status BMI of 19 -24 = normal Nutritional Status Detail appetite "fine"  Have you ever been in a relationship where you felt threatened, hurt or afraid?No   Does patient need assistance? Functional Status Self care Ambulation Normal Comments no missed doses of rxes   Physical Exam  General:  alert and well-nourished.   Mouth:  pharynx pink and moist, no erythema, no exudates, and fair dentition.   Lungs:  normal breath sounds, no crackles, and no wheezes.   Heart:  normal rate, regular rhythm, and no murmur.      Impression & Recommendations:  Problem # 1:  HIV INFECTION (ICD-042) Since his renal function is back to normal I will readjust his medications.  I will switch Retrovir back to full dose Combivir, increase Isentress to twice daily dosing, and continue Sustiva.  Fortunately his viral  load is undetectable on the suboptimal regimen and his CD4 count remains normal.  I have corrected his regimen and sent [facility] of [facility]. His updated medication list for this problem includes:    Septra Ds 800-160 Mg Tabs (Sulfamethoxazole-trimethoprim) .Marland Kitchen... Take 1 tablet by mouth every m, w, and f  Diagnostics Reviewed:  CD4: 560 (08/17/2009)   WBC: 6.5 (03/10/2009)   Hgb: 13.3 (03/10/2009)   HCT: 40.4 (03/10/2009)   Platelets: 214 (03/10/2009) HIV-1 RNA: <48 copies/mL (08/17/2009)   HBSAg: No (07/03/2006)  Orders: T-Comprehensive Metabolic Panel ([id number]) T-RPR (Syphilis) ([id number]) T-Lipid Profile ([id number]) T-CBC w/Diff (62952-84132) Est. Patient Level III (99213)Future  Orders: T-CD4SP ([facility]) (CD4SP) ... 02/28/2010 T-HIV Viral Load [id number]) ... 02/28/2010 T-Basic Metabolic Panel [id number]) ... 02/28/2010  Medications Added to Medication List This Visit: 1)  Combivir 150-300 Mg Tabs (Lamivudine-zidovudine) .... Take 1 tablet by mouth two times a day 2)  Prograf 1 Mg Caps (Tacrolimus) .... Take 3 tablets by mouth two times a day 3)  Prednisone 5 Mg Tabs (Prednisone) .... Take 1 tablet by mouth every m, w, and f 4)  Septra Ds 800-160 Mg Tabs (Sulfamethoxazole-trimethoprim) .... Take 1 tablet by mouth every m, w, and f  Patient Instructions: 1)  Please schedule a follow-up appointment in 6 months.  Prescriptions: SEPTRA DS 800-160 MG TABS (SULFAMETHOXAZOLE-TRIMETHOPRIM) Take 1 tablet by mouth every M, W, and F  #10 x 11   Entered and Authorized by:   Cliffton Asters MD   Signed by:   Cliffton Asters MD on 09/01/2009   Method used:   [facility]        [facility] [phone][facility] (retail)       [address]       [place], [place]  [address]       Ph: [phone] or [phone]       Fax: [phone]   RxID:   [id number] SUSTIVA 600 MG TABS (EFAVIRENZ) Take 1 tablet by mouth at bedtime  #30 x 11   Entered and Authorized by:   Cliffton Asters MD   Signed by:   Cliffton Asters MD on 09/01/2009   Method used:   [facility]        [facility]  [address] [phone]* (retail)       [address]       [place], [place]  [address]       Ph: [phone] or [phone]       Fax: [phone]   RxID:   [id number] COMBIVIR 150-300 MG TABS (LAMIVUDINE-ZIDOVUDINE) Take 1 tablet by mouth two times a day  #60 x 11   Entered and Authorized by:   Cliffton Asters MD   Signed by:   Cliffton Asters MD on 09/01/2009   Method used:   [facility]        [facility]  [address] [phone]* (retail)       [address]       [place], [place]  [address]       Ph:  [phone] or [phone]       Fax: [phone]   RxID:   [id number] ISENTRESS 400 MG TABS (RALTEGRAVIR POTASSIUM) Take 1 tablet by mouth two times a day  #60 x 11   Entered and Authorized by:   [name]  Orvan Falconer MD   Signed by:   Cliffton Asters MD on 09/01/2009   Method used:   [facility]        [facility]  [address] [phone]* (retail)       [address]       [place], [place]  [address]       Ph: [phone] or [phone]       Fax: [phone]   RxID:   [id number]

## 2010-06-10 NOTE — Miscellaneous (Signed)
  Clinical Lists Changes  Observations: Added new observation of YEARAIDSPOS: 2006  (04/21/2010 11:16) Added new observation of HIV STATUS: CDC-defined AIDS  (04/21/2010 11:16)

## 2010-06-28 ENCOUNTER — Encounter (INDEPENDENT_AMBULATORY_CARE_PROVIDER_SITE_OTHER): Payer: Self-pay | Admitting: *Deleted

## 2010-07-05 ENCOUNTER — Encounter: Payer: Self-pay | Admitting: Internal Medicine

## 2010-07-06 NOTE — Miscellaneous (Signed)
  Clinical Lists Changes 

## 2010-07-15 NOTE — Miscellaneous (Signed)
  Clinical Lists Changes  Observations: Added new observation of FLU VAX: Historical (02/09/2010 8:49)      Immunization History:  Influenza Immunization History:    Influenza:  historical (02/09/2010)

## 2010-07-20 ENCOUNTER — Encounter: Payer: Self-pay | Admitting: Internal Medicine

## 2010-07-28 LAB — T-HELPER CELL (CD4) - (RCID CLINIC ONLY)
CD4 % Helper T Cell: 30 % — ABNORMAL LOW (ref 33–55)
CD4 T Cell Abs: 560 uL (ref 400–2700)

## 2010-08-03 ENCOUNTER — Other Ambulatory Visit: Payer: Medicare Other

## 2010-08-03 DIAGNOSIS — B20 Human immunodeficiency virus [HIV] disease: Secondary | ICD-10-CM

## 2010-08-04 LAB — BASIC METABOLIC PANEL WITH GFR
BUN: 18 mg/dL (ref 6–23)
Calcium: 8.1 mg/dL — ABNORMAL LOW (ref 8.4–10.5)
GFR, Est African American: 58 mL/min — ABNORMAL LOW (ref >60)
GFR, Est Non African American: 48 mL/min — ABNORMAL LOW (ref >60)
Glucose, Bld: 92 mg/dL (ref 70–99)
Potassium: 5.1 mEq/L (ref 3.5–5.3)
Sodium: 139 mEq/L (ref 135–145)

## 2010-08-05 LAB — HIV-1 RNA QUANT-NO REFLEX-BLD: HIV 1 RNA Quant: <20 copies/mL (ref <20)

## 2010-08-11 LAB — T-HELPER CELL (CD4) - (RCID CLINIC ONLY): CD4 T Cell Abs: 960 uL (ref 400–2700)

## 2010-08-17 ENCOUNTER — Ambulatory Visit (INDEPENDENT_AMBULATORY_CARE_PROVIDER_SITE_OTHER): Payer: Medicare Other | Admitting: Internal Medicine

## 2010-08-17 ENCOUNTER — Encounter: Payer: Self-pay | Admitting: Internal Medicine

## 2010-08-17 VITALS — BP 123/81 | HR 76 | Temp 97.7°F | Ht 68.0 in | Wt 150.0 lb

## 2010-08-17 DIAGNOSIS — Z940 Kidney transplant status: Secondary | ICD-10-CM

## 2010-08-17 DIAGNOSIS — B20 Human immunodeficiency virus [HIV] disease: Secondary | ICD-10-CM

## 2010-08-17 NOTE — Assessment & Plan Note (Signed)
Luis Reed has excellent adherence and his infection remains under very good control with an undetectable viral load of less than 20 and a CD4 count well up in the normal range at 880. I will continue his current regimen. I have encouraged him to followup at six-month intervals.

## 2010-08-17 NOTE — Progress Notes (Signed)
  Subjective:    Patient ID: Luis Reed, male    DOB: November 28, 1960, 50 y.o.   MRN: [id number]  HPI [name] is in for his first visit with me in one year. He states that he is doing very well and has not had any problems or concerns since his last visit. He denies missing a single dose of his HIV medications. He can describe taking them all correctly.    Review of Systems     Objective:   Physical Exam  Constitutional: He appears well-developed and well-nourished. No distress.  HENT:  Mouth/Throat: Oropharynx is clear and moist. No oropharyngeal exudate.  Cardiovascular: Normal rate and regular rhythm.   No murmur heard. Pulmonary/Chest: Breath sounds normal. He has no wheezes. He has no rales.  Psychiatric: He has a normal mood and affect.          Assessment & Plan:

## 2010-09-08 ENCOUNTER — Other Ambulatory Visit: Payer: Self-pay | Admitting: Internal Medicine

## 2010-09-08 DIAGNOSIS — B20 Human immunodeficiency virus [HIV] disease: Secondary | ICD-10-CM

## 2010-09-09 ENCOUNTER — Other Ambulatory Visit (INDEPENDENT_AMBULATORY_CARE_PROVIDER_SITE_OTHER): Payer: Medicare Other | Admitting: *Deleted

## 2010-09-09 DIAGNOSIS — B20 Human immunodeficiency virus [HIV] disease: Secondary | ICD-10-CM

## 2010-09-09 DIAGNOSIS — Z21 Asymptomatic human immunodeficiency virus [HIV] infection status: Secondary | ICD-10-CM

## 2010-09-09 MED ORDER — LAMIVUDINE-ZIDOVUDINE 150-300 MG PO TABS: 1.0000 | ORAL_TABLET | Freq: Two times a day (BID) | ORAL | Status: DC

## 2010-09-09 MED ORDER — RALTEGRAVIR POTASSIUM 400 MG PO TABS: 400.0000 mg | ORAL_TABLET | Freq: Two times a day (BID) | ORAL | Status: DC

## 2010-09-09 MED ORDER — EFAVIRENZ 600 MG PO TABS: 600.0000 mg | ORAL_TABLET | Freq: Every day | ORAL | Status: DC

## 2010-09-21 NOTE — Assessment & Plan Note (Signed)
OFFICE VISIT   Luis Reed, Luis Reed  DOB:  1960/10/19                                       01/17/2007  ZOXWR#:[id number]   Luis Reed returns today after having his left brachial cephalic  fistula placed on August 11th.  His hospital stay was quite complicated  with postop infections, primarily a pneumonia which was unrelated to his  access procedure.   He presents today with a well-healed incision.  There is an easily  palpable thrill all the way up to the level of the left shoulder.  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  He wishes to have a few more weeks to  recover prior to this.  We will schedule his procedure for February 12, 2007.  The risks, benefits, possible complications, and procedural  details were described to him today.   Janetta Hora. Fields, MD  Electronically Signed   CEF/MEDQ  D:  01/17/2007  T:  01/18/2007  Job:  [id number]   cc:   [facility]

## 2010-09-21 NOTE — Assessment & Plan Note (Signed)
OFFICE VISIT   HAIK, MAHONEY  DOB:  [birth year]                                       11/24/2006  MVHQI#:[id number]   Note for the chart:  The patient is a 50 year old male who previously  had a left radiocephalic AV fistula placed by Dr. Edilia Bo in 2001.  He  is referred today for aneurysmal dilatation of the fistula.   He has had no episodes of bleeding and no ulcerations on the fistula.  He has had no problems with flow.   PHYSICAL EXAMINATION:  Vital signs:  Blood pressure is 153/93, heart  rate is 70 and regular.  Vascular:  There is diffuse aneurysmal  dilatation of the left radiocephalic AV fistula.  There are a couple of  areas with slightly shiny skin but no frank ulcerations.   I believe the best option for the patient would be ligation of the  fistula and creation of a new left brachiocephalic fistula as the vein  above his elbow on the left side seems adequate for this.  We also might  be able to create a brachiocephalic fistula without completely  disconnecting his radiocephalic fistula and in the meanwhile he could  continue to use this until brachiocephalic was ready.  If it did become  disconnected, he would need a Diatek catheter temporarily.   I had a length discussion with the patient about these options today.  He is somewhat reluctant to have the fistula ligated at this point.  He  will discuss it with his family and get it scheduled within the next few  weeks if he desires to have the procedure done.   Janetta Hora. Fields, MD  Electronically Signed   CEF/MEDQ  D:  12/01/2006  T:  12/04/2006  Job:  [id number]

## 2010-09-21 NOTE — Assessment & Plan Note (Signed)
OFFICE VISIT   ANN, GROENEVELD  DOB:  1961-02-13                                       02/28/2007  ZOXWR#:[id number]   Patient returns today following ligation of his left radiocephalic  fistula in October.  Prior to that, he had a left brachiocephalic AV  fistula placed.  He has noticed some increase in swelling in the area of  his previous radiocephalic AV fistula.  He has had no fever or chills.  He has had no erythema around the wound.   PHYSICAL EXAMINATION:  On exam today, there is still some audible  Doppler flow in the fistula.  He has several large collateral branches  from the brachiocephalic AV fistula.  The Doppler signal completely  dissipates with compression of his new fistula.  There may be some flow  persisting in the fistula due to collateral branches.  The aneurysms are  quite firm in character, and I believe that they are currently filled  with a large amount of thrombus.   I do not believe that the aneurysms are at much risk to continue to  expand over time.  However, if he has persistent flow into the fistula,  he may require ligation of the side branches to completely shut off flow  into this area.  I would prefer to manage this conservatively, if  possible, due to the fact that every time patient has anesthesia he  spikes fevers and has had several episodes of infection.  I will see him  again in two weeks' time, at which time if the ultrasound still shows  persistent flow into the fistula, we may evaluate it further to find  precisely where the  flow is originating and then consider whether or not to ligate any other  side branches.  His upper arm fistula is doing well, and they are  successfully using this for dialysis.   Janetta Hora. Fields, MD  Electronically Signed   CEF/MEDQ  D:  03/01/2007  T:  03/01/2007  Job:  [id number]   cc:   [employer]

## 2010-09-21 NOTE — Op Note (Signed)
Luis, Reed NO.:  [id number]   MEDICAL RECORD NO.:  [id number]          PATIENT TYPE:  OIB   LOCATION:  2305                         FACILITY:  [facility]   PHYSICIAN:  Janetta Hora. Fields, MD  DATE OF BIRTH:  [birth year]   DATE OF PROCEDURE:  12/18/2006  DATE OF DISCHARGE:                               OPERATIVE REPORT   PROCEDURE:  Left brachiocephalic AV fistula.   PREOPERATIVE DIAGNOSIS:  End-stage renal disease.   POSTOPERATIVE DIAGNOSIS:  End-stage renal disease.   ANESTHESIA:  Local with IV sedation.   ASSISTANT:  Emilio Aspen, RNFA.   INDICATIONS:  The patient is a 50 year old male who has had a left  radiocephalic fistula for approximately 8 years.  The fistula has now  developed aneurysmal degeneration.  We are placing a left  brachiocephalic AV fistula today in hopes that this will be mature  enough in a few weeks that we can ligate the radiocephalic fistula to  prevent further aneurysmal degeneration and rupture.  The patient was  explained the risks, benefits, possible complications and procedure  details ahead of time.  I also explained to him that we would try to  keep the radiocephalic fistula in continuity until the upper arm fistula  is ready for use.  I also explained to him the possibility of still  having 2 fistulas in continuity and if that happened that we would  ligate the fistula  early and place a temporary catheter if necessary  for dialysis.   OPERATIVE FINDINGS:  1. 3-mm cephalic vein.  2. Left radiocephalic fistula patent at end of case.   DESCRIPTION OF PROCEDURE:  After obtaining informed consent, the patient  was taken to the operating room.  The patient was placed in supine  position on the operating table.  After adequate sedation, the patient's  entire left upper extremity was prepped and draped in the usual sterile  fashion.  Local anesthesia was infiltrated near the antecubital crease.  A transverse incision  was made in this location and carried down through  the subcutaneous tissues down to the level of cephalic vein.  This was  dissected free circumferentially.  Several large draining veins that  were in continuity with the fistula were left intact.  The remainder of  the cephalic vein branches were dissected free circumferentially,  ligated and divided between silk ties. The brachial artery was then  dissected free in the medial portion of the incision.  This was  controlled proximally and distally with Vesseloops.  The patient was  given 5000 units of intravenous heparin.  The distal cephalic vein was  ligated with 2-0 silk tie and transected.  The vein was then thoroughly  flushed with heparinized saline and marked for orientation.  It was then  swung over the level of brachial artery. A longitudinal arteriotomy was  made.  The vein was then sewn end of vein to side of artery using a  running 7-0 Prolene suture.  Just prior to completion of the anastomosis  this was fore bled, back bled and thoroughly flushed.  The anastomosis  was secured,  clamps were released, there was a palpable thrill in the  fistula immediately.  Hemostasis was obtained.  There was still also  flow in the radiocephalic fistula.  Next the subcutaneous tissues were  reapproximated using running 3-0 Vicryl suture.  The skin was closed  with 4-0 Vicryl subcuticular stitch.  Benzoin  and Steri-Strips were applied.  The patient tolerated the procedure well  and there were no complications. Instrument, sponge and needle counts  were correct at the end of the case.  The patient was taken to the  recovery room in stable condition.      Janetta Hora. Fields, MD  Electronically Signed     CEF/MEDQ  D:  12/18/2006  T:  12/19/2006  Job:  [id number]

## 2010-09-21 NOTE — Assessment & Plan Note (Signed)
OFFICE VISIT   ZOEY, GILKESON  DOB:  May 26, 1960                                       03/14/2007  HQION#:[id number]   Luis Reed, Luis Reed     MR #[id number]  DOB:  August 15, 1960   03/10/2004:  The patient returns for followup today.  He had ligation of  his left radiocephalic A-V fistula on 02/12/2007.  He has recovered well  from this.  He still had some pain, swelling, tenderness over the  forearm fistula when last seen 2 weeks ago.  He still has some  occasional pain in this area but the amount of swelling over the  aneurysms has greatly diminished.  On exam today there is no palpable  flow in either of the fistula aneurysms in the forearm.  There is some  audible minimal Doppler flow.  This is probably related to side branches  from his upper arm fistula.  I do not believe he is at risk of rupture  of the aneurysms.  He has some minimal discomfort from this.  I do not  believe this is infection.  He probably still has some mild  thrombophlebitis from thrombosis of the aneurysms.  I discussed with him  today the fact that we could resect these aneurysms and ligate the side  branches, however due to his previous problems with anesthesia if this  is causing him minimal symptoms I would not advise this currently.  He  will think about whether or not he wishes to have this done at some  point in the future.  It is certainly not an urgent matter at this point  and would be fairly elective.  His upper arm fistula is working well.  He will follow up on an as-needed basis.   Janetta Hora. Fields, MD  Electronically Signed   CEF/MEDQ  D:  03/14/2007  T:  03/15/2007  Job:  [id number]

## 2010-09-21 NOTE — H&P (Signed)
NAMEDAMARE, SERANO NO.:  [id number]   MEDICAL RECORD NO.:  [id number]          PATIENT TYPE:  OIB   LOCATION:  2305                         FACILITY:  [facility]   PHYSICIAN:  Rufina Falco, M.D.     DATE OF BIRTH:  July 26, 1960   DATE OF ADMISSION:  12/18/2006  DATE OF DISCHARGE:                              HISTORY & PHYSICAL   CHIEF COMPLAINT:  Fever/hypotension.   HISTORY OF PRESENT ILLNESS:  The patient is a 50 year old African  American man with past medical history significant for ESRD secondary to  hypertension who goes to [facility] for Tuesday, Thursday and  Saturday hemodialysis, HIV positive and follows up with Dr. Orvan Falconer,  hepatitis B, hepatitis C and hepatitis A (sees Dr. Leonard Schwartz at  [facility]), who presented to the emergency department to have a left  brachiocephalic AV fistula.  The patient tolerated the procedure  initially and then he began to have hypotension and fever with a T-max  of 105 degrees.  The patient has no other complaints.  He denies  headache, blurred vision, dizziness, sore throat, chest pain, nausea,  vomiting, diarrhea, constipation, abdominal pain, hematochezia, melena,  lower extremity pain.  The patient did complain of a cough with minimal  blood streaking after his procedure.  No other complaints voiced.   PAST MEDICAL HISTORY:  1. ESRD secondary to hypertension with hemodialysis initiated in June      2001.  2. HIV positive diagnosed January 2001.  3. Hypertension.  4. Hepatitis C.  5. Hepatitis A.  6. History of hepatitis B, remote.  7. Syphilis as a teenager.  8. Testicular swelling with surgery.  9. Heavy alcohol usage, quit in 1999.  10.Remote tobacco history, stopped in 1986.  11.Status post CVA secondary to hypotension, question cocaine abuse      October 14, 1999.  12.History of seizures secondary to #11.  13.History of Enterococcus faecalis sepsis in October 2001, possibly      catheter related.  14.Parathyroidectomy 2004 (Dr. Gerrit Friends).  15.FOBT positive followed by Dr. Russella Dar.  16.Mild degenerative changes, no HNP, no infection.   PAST SURGICAL HISTORY:  Parathyroidectomy 2004.   SOCIAL HISTORY:  Tobacco, quit 30 years ago with a 2 to 3 pack year  history.  Alcohol, heavy history, quit 15 to 20 years ago.  Denies  illicit drugs.  He is unemployed, lives in [place], is married, has 1  child.   FAMILY HISTORY:  Mother with hypertension, dad who is healthy.  He has 5  siblings who are reported healthy.  He has 1 daughter who is healthy and  2 step-daughters who are healthy.   MEDICATIONS:  1. Epivir.  2. Sustiva.  3. Nepro.  4. Ribavirin.  5. Pegasys interferon.  6. Norvasc.  7. Viagra.  8. Lisinopril.  9. Retrovir.   ALLERGIES:  Vicodin which causes nausea.   PHYSICAL EXAMINATION:  VITALS:  Temperature 104 which increased to 105,  pulse in the 120s, sinus tachycardia, blood pressure 80 systolic with  improvement in the 1-teens after giving normal saline bolus of 250.  O2  saturation  is 93% on room air.  GENERAL:  No acute distress.  Eyes are bloodshot.  CARDIOVASCULAR:  S1, S2.  Tachycardic.  Positive murmur in the tricuspid  region.  No JVD.  LUNGS:  CTA anteriorly, right abase with mild crackles.  ABDOMEN:  Soft, nontender, positive bowel sounds, no guarding, no  rebound.  NEURO:  Alert and oriented x3.  Cranial nerves II through XII are  grossly intact.  Motor 5/5, sensation intact, cerebella intact.  EXTREMITIES:  No edema.  No cyanosis.  Patient with left AV fistula.  SKIN:  No abnormal lesions, rashes or lacerations.   LABORATORY DATA:  I-STAT, hemoglobin equals 11.6, hematocrit equals  34.0, sodium equals 138, potassium equals 4.8, glucose equals 81.   ASSESSMENT AND PLAN:  1. Fever/hypotension.  Very worrisome for sepsis.  Will give      additional 250 normal saline bolus, admit to ICU.  Check blood      cultures x2.  Check UA.  Check urine C&S, chest  x-ray PA and      lateral.  Will give Tylenol as needed for fever, give cooling      blankets and ice packs.  Check CBC with diff, check a renal panel,      check LFTs, check sputum gram stain and C&S, will give vancomycin      and Fortaz, check serum cortisol level.  Will consider getting a      Pulm/CCM consult if the patient does not stay stable.  Have ordered      an ID consult with Dr. Orvan Falconer (appreciate ID assistance).  2. End stage renal disease.  Will get hemodialysis on December 19, 2006.      Will resume Nepro and Epogen.  EDW equals 58.50.  3. Human immunodeficiency virus positive.  Will resume patient's O42      medications which include ribavirin, Epivir, Retrovir, and Sustiva.      Infectious Disease is on board.  4. Hepatitis B/C.  Will continue his Pegasys interferon and ribavirin.  5. Hypertension.  Will hold blood pressure medications which include      lisinopril and Norvasc.  6. History of alcohol abuse.  The patient quit in 1999.  Will check      LFTs and monitor for withdrawal.  7. Status post left brachiocephalic arteriovenous fistula per Dr.      Darrick Penna.  8. Prophylaxis.  Protonix/SCDs.  9. History of tobacco abuse.  10.History of cerebrovascular accident.   ADDENDUM:  Addendum to the written H&P:  Reviewed that the patient had a  possible right lower lobe pneumonia on chest x-ray.  As a result, we  would discontinue Elita Quick and start the patient on Avelox and Zosyn as  well as continue the vancomycin for empiric coverage.  We will also  check an ABG secondary to the patient having  an episode of hypoxia.  Also the patient did not have a known history of  a murmur which is definitely audible in the tricuspid region.  We will  check a 2D echo to check for vegetations.  Would strongly recommend  advancing to a TEE based on the 2D echo findings.      Rufina Falco, M.D.  Electronically Signed     JY/MEDQ  D:  12/18/2006  T:  12/19/2006  Job:  [id number]

## 2010-09-21 NOTE — Op Note (Signed)
NAMEKAREEN, Luis Reed              ACCOUNT NO.:  [id number]   MEDICAL RECORD NO.:  [id number]          PATIENT TYPE:  AMB   LOCATION:  SDS                          FACILITY:  [facility]   PHYSICIAN:  Janetta Hora. Fields, MD  DATE OF BIRTH:  1960-07-05   DATE OF PROCEDURE:  02/12/2007  DATE OF DISCHARGE:                               OPERATIVE REPORT   PROCEDURE:  Ligation left radiocephalic arteriovenous fistula.   PREOPERATIVE DIAGNOSIS:  Aneurysmal arteriovenous fistula, left arm.   POSTOPERATIVE DIAGNOSIS:  Aneurysmal arteriovenous fistula, left arm.   ANESTHESIA:  Local with sedation.   OPERATIVE DETAIL:  After obtaining informed consent, the patient taken  to the operating room.  The patient was placed in supine position on the  operating table.  After adequate sedation, the patient's entire left  upper extremity prepped and draped in the usual sterile fashion.  Local  anesthesia was infiltrated at a preexisting longitudinal scar near the  wrist.  Incision was reopened, carried down through subcutaneous  tissues.  The cephalic vein fistula was dissected free  circumferentially.  This was carried down.  Dissection was carried down  to the level of the radial artery.  Radial artery was dissected free  proximal and distal to the fistula.  The fistula was then doubly ligated  with a zero silk ties.  This was then transected.  The redundant segment  of fistula was removed from the arterial anastomosis.  Enough cuff was  left to oversew this with a running 5-0 Prolene suture.  Just prior to  completion of anastomosis, this was fore bled, back bled, thoroughly  flushed.  Anastomosis was secured, clamps released.  There was good  pulse in the distal radial artery immediately.  Next hemostasis was  obtained.  Subcutaneous tissues reapproximated using running 3-0 Vicryl  suture.  Skin was closed with 4-0 Vicryl subcuticular stitch.  The  patient tolerated procedure well and there were no  complications.  Instrument, sponge, needle counts correct at the end of the case.  The  patient taken to recovery room in stable condition.      Janetta Hora. Fields, MD  Electronically Signed     CEF/MEDQ  D:  02/12/2007  T:  02/12/2007  Job:  [id number]

## 2010-09-21 NOTE — Discharge Summary (Signed)
NAMEBREES, HOUNSHELL NO.:  [id number]   MEDICAL RECORD NO.:  [id number]          PATIENT TYPE:  INP   LOCATION:  5505                         FACILITY:  [facility]   PHYSICIAN:  Janetta Hora. Fields, MD  DATE OF BIRTH:  12-25-1960   DATE OF ADMISSION:  12/18/2006  DATE OF DISCHARGE:  12/25/2006                               DISCHARGE SUMMARY   DISCHARGE DIAGNOSES:  1. End-stage renal disease.  2. Fever with hypotension secondary to right lower lobe pneumonia.  3. Thrombocytopenia.  4. Human immunodeficiency virus positive.  5. Weakness.  6. Hepatitis B and C.  7. Anemia.  8. Status post arteriovenous fistula done by Dr. Darrick Penna.  9. Hypocalcemia.  10.Hyperphosphatemia.   DISCHARGE MEDICATIONS:  1. Os-Cal 1000 mg p.o. t.i.d.  2. Aranesp 6.25 mcg IV every Tuesday with hemodialysis.  3. Sustiva 600 mg p.o. q.h.s.  4. Lamivudine 50 mg p.o. q.h.s.  5. Avelox 400 mg p.o. daily x7 days.  6. Nepro 237 mL p.o. daily.  7. Ribavirin one pill p.o. daily.  8. Retrovir 300 mg p.o. q.h.s.  9. Restoril 15 mg p.o. q.h.s. p.r.n. insomnia.  10.Norvasc 10 mg p.o. daily.  11.Lisinopril 40 mg p.o. daily.  12.The patient will be on Epogen 7500 units every hemodialysis.   DISPOSITION AND FOLLOWUP:  The patient is to resume his normal  hemodialysis schedule at [facility].   PROCEDURES PERFORMED:  The patient had a left brachiocephalic AV fistula  undertaken by Dr. Janetta Hora. Fields on December 18, 2006.  The patient  also had a chest x-ray on December 18, 2006, which showed increased  markings at the right base worrisome for early infiltrate.  The patient  also had a chest x-ray on December 20, 2006, which showed no definite  pneumonia.   There were no consultations made other than Dr. Darrick Penna consulting  nephrology to take over the patient.   BRIEF HISTORY AND PHYSICAL:   CHIEF COMPLAINT:  Fever/hypotension.   HISTORY OF PRESENT ILLNESS:  The patient is a  50 year old African  American man with past medical history significant for ESRD secondary to  hypertensive nephropathy who goes to [facility] for Tuesday,  Thursday and Saturday hemodialysis who follows with Dr. Orvan Falconer,  hepatitis B, hepatitis C, and hepatitis A whom he sees Dr. Evalina Field at [facility] for.  The patient presents to the emergency department  to have a left brachiocephalic AV fistula.  The patient tolerated the  procedure initially and then he began to have hypotension and fever with  a Tmax of 105 degrees Fahrenheit.  The patient has no other complaints.  He denies headache, blurred vision, dizziness, sore throat, chest pain,  nausea, vomiting, diarrhea, constipation, abdominal pain, hematochezia,  melena, and lower extremity pain. The patient did complain of a cough  with minimal blood streaking after his procedure.  No other complaints  were voiced.   PAST MEDICAL HISTORY:  1. ESRD secondary to hypertension.  2. HIV positive, diagnosed January 2001.  3. Hypertension.  4. Hepatitis C.  5. Hepatitis A.  6. History of hepatitis  B, remote.  7. Syphilis as a teenager.  8. Testicular swelling.  9. Heavy alcohol usage.  The patient quit in 1999.  10.Remote tobacco history, stopped in 1986.  11.Status post CVA secondary to hypotension with a question of cocaine      abuse in 2001.  12.History of seizure secondary to #11.  13.History of Enterococcus faecalis sepsis in October 2001, possibly      catheter related.  14.Parathyroidectomy in 2004.  15.FOBT positive, followed by Dr. Russella Dar.  16.Mild degenerative changes.  No H&P, no infection.   PAST SURGICAL HISTORY:  Parathyroidectomy 2004.   SOCIAL HISTORY:  The patient is a former smoker who quit 30 years ago  with a two to three pack-year history.  Heavy alcohol, quit 15-20 years  ago.  Denies illicit drugs.  He is unemployed, lives in [place], is  married and has one healthy child.   FAMILY  HISTORY:  Mother with hypertension.  Dad is healthy.  He has five  healthy siblings.  He has one daughter who is healthy and two step-  daughters who are healthy.   HOME MEDICATIONS:  Include Epivir, Sustiva, Nepro, ribavirin, Pegasys  interferon, Norvasc, Viagra, lisinopril, and Retrovir.   ALLERGIES:  Include VICODIN, which causes nausea.   PHYSICAL EXAMINATION:  VITAL SIGNS:  Temperature was 104, which  increased to 105; pulse in the 120s in sinus tachycardia; blood pressure  systolic in the 80s; O2 saturations 93% on room air.  GENERAL:  No acute distress.  Eyes were bloodshot.  CARDIOVASCULAR:  S1, S2, tachycardia.  Positive murmur in the tricuspid  region.  No JVD.  LUNGS:  CTA anteriorly, right base with mild crackles posteriorly.  ABDOMEN:  Soft, nontender, positive bowel sounds.  No guarding, no  rebound.  NEUROLOGIC:  Alert and oriented x3.  Cranial nerves II-XII grossly  intact.  Motor intact, sensation intact, cerebellar function intact.  EXTREMITIES:  No edema, no cyanosis.  The patient with left AV fistula  which is patent.  SKIN:  No abnormal lesions, rashes or lacerations.   LABORATORY DATA:  At time of admission includes an i-STAT hemoglobin =  11.6, hematocrit = 34.0, sodium = 138, potassium = 4.8, and glucose =  81.   HOSPITAL COURSE BY PROBLEM:  #1 - FEVER/HYPOTENSION SECONDARY TO  PNEUMONIA.  The patient was admitted to an ICU bed given his unstable  state.  Blood cultures x2 were checked and were subsequently negative.  Chest x-ray revealed a right lower lobe pneumonia which is the likely  source of the patient's fever and hypotension.  Urinalysis was checked  with no signs of infection.  The patient was given Tylenol for his fever  and cooling blankets as well as ice packs.  The patient was initiated on  vancomycin and Elita Quick initially, and Avelox was later added after  consulting Dr. Orvan Falconer with infectious disease.  A serum cortisol level  was also  checked and was normal.  The patient remained feverish over the  next day-and-a-half.  By the second or third day of the patient's  hospital stay he was afebrile.  The patient will be discharged on Avelox  alone.  He will follow up with Dr. Orvan Falconer for further management of  his HIV.   #2 - END-STAGE RENAL DISEASE.  The patient was maintained on his normal  hemodialysis schedule.  He was resumed on his Nepro and Epogen.   #3 - THROMBOCYTOPENIA.  The patient's platelets were noted to start  trending  down during his hospital stay.  He never had any active signs  of bleeding.  The patient did receive heparin during hemodialysis.  However, platelets began trending up prior to the patient's discharge  and his thrombocytopenia was thought to be likely secondary to his  sepsis.  These will need to be followed up in the outpatient setting to  make sure they have not trended back down.  The patient does have a  history of hepatitis B and C which need to be watched closely.   #4 - HIV POSITIVE.  The patient continued on her HAART treatment which  includes Sustiva with lamivudine and Retrovir.  He will continue to  follow up with Dr. Orvan Falconer with ID.   #5 - WEAKNESS.  The patient is noted to have notable nonfocal weakness,  likely secondary to the patient's HIV status and his chronic illnesses,  as well as his sepsis that he has undergone.  The patient underwent  PT/OT consult which recommended giving the patient a rolling walker at  time of discharge.   #6 - HEPATITIS B AND C.  The patient was continued on his ribavirin and  interferon.   #7 - ANEMIA.  The patient will be continued on Aranesp and EPO.  His  hemoglobin remained fairly stable throughout hospital stay.   #8 - STATUS POST AV FISTULA DONE BY DR  FIELDS.  The patient tolerated  the procedure well and left with a patent graft.   #9 - HYPOCALCEMIA.  The patient was started on Os-Cal during hospital  stay.   #10 -  HYPERPHOSPHATEMIA.  The patient was initiated on PhosLo during  hospital stay as well.   DISCHARGE LABORATORIES:  Sodium 141, potassium 4.4, chloride 103, bicarb  30, glucose 120, BUN 71, creatinine 11.68, albumin 2.1, calcium 8.0,  phosphorus 4.8.  WBC 3.6, hemoglobin 8.4, hematocrit 25.0, MCV 111.6,  RDW 14.4, platelets 76.  Blood cultures were negative x2.  The patient  had hepatitis B surface antigen which is negative.  TSH was 0.451.  Cortisol was 25.2.   DISCHARGE VITAL SIGNS:  Temperature 97.6, pulse 77, respiratory rate 18,  blood pressure 118/79, O2 saturations 95% on room air.      Rufina Falco, M.D.  Electronically Signed      Janetta Hora. Fields, MD  Electronically Signed    JY/MEDQ  D:  12/28/2006  T:  12/28/2006  Job:  [id number]   cc:   Janetta Hora. Darrick Penna, MD

## 2010-09-24 ENCOUNTER — Encounter: Payer: Self-pay | Admitting: Internal Medicine

## 2010-09-24 ENCOUNTER — Emergency Department (HOSPITAL_COMMUNITY)
Admission: EM | Admit: 2010-09-24 | Discharge: 2010-09-25 | Disposition: A | Discharge status: Home / Self Care | Payer: Medicare Other | Attending: Emergency Medicine | Admitting: Emergency Medicine

## 2010-09-24 DIAGNOSIS — K089 Disorder of teeth and supporting structures, unspecified: Secondary | ICD-10-CM | POA: Insufficient documentation

## 2010-09-24 DIAGNOSIS — K047 Periapical abscess without sinus: Secondary | ICD-10-CM | POA: Insufficient documentation

## 2010-09-24 DIAGNOSIS — R6883 Chills (without fever): Secondary | ICD-10-CM | POA: Insufficient documentation

## 2010-09-24 NOTE — Consult Note (Signed)
NAME:  Luis Reed, Luis Reed                        ACCOUNT NO.:  [id number]   MEDICAL RECORD NO.:  [id number]                   PATIENT TYPE:  INP   LOCATION:  5504                                 FACILITY:  [facility]   PHYSICIAN:  Mindi Slicker. Lowell Guitar, M.D.               DATE OF BIRTH:  05/23/1960   DATE OF CONSULTATION:  06/14/2002  DATE OF DISCHARGE:                                   CONSULTATION   HISTORY OF PRESENT ILLNESS:  The patient is a 50 year old black male with  HIV and end-stage renal disease secondary to hypertension, as well as  hyperparathyroidism status post parathyroidectomy today. The patient  normally gets dialysis on Tuesdays, Thursdays, and Saturdays at [facility], and was last dialyzed on June 13, 2002 through  his left forearm AV fistula.  Dr. Arrie Aran referred the patient for  parathyroidectomy secondary to elevation of calcium times phosphorus  products and a biointact PPH level of 480.  The patient tolerated the  operation well; however, his blood pressure was elevated postoperatively.   PAST MEDICAL HISTORY:  1. HIV diagnosed January 2001.  2. End-stage renal disease secondary to hypertension, on hemodialysis since     June 2001.  3. Hepatitis C positive.  4. History of remote hepatitis B.  5. History of syphilis.  6. History of testicular mass status post surgery.  7. History of alcohol abuse, none since 1999.  8. Remote tobacco abuse.  9. Status post CVA secondary to hypertension versus cocaine induced.  10.      Seizures secondary to #9.  11.      History of catheter sepsis with Enterobacter fecalis.   ALLERGIES:  No known drug allergies.   MEDICATIONS:  1. Lotensin 10 mg p.o. daily.  2. OS-Cal 500 mg three with snacks, five with meals.  3. Epivir 10 mg p.o. daily.  4. Retrovir 300 mg p.o. daily.  5. Sustiva 200 mg p.o. daily.  6. Zestril 10 mg p.o. q.h.s.  7. Benadryl 25 mg one to two q.8h. p.r.n.  8. Nephro-Vite one p.o.  daily.  9. Phenobarbital 200 mg p.o. q.h.s.   FAMILY HISTORY:  The patient's mother was diabetic and had hypertension.   SOCIAL HISTORY:  The patient is disabled.  He is married.  No current  alcohol, tobacco, or drug use.   REVIEW OF SYSTEMS:  The patient denies any bone pain prior to the operation  today.  He does have a sore neck postoperatively with pain upon swallowing.  He denies any hoarseness.  Otherwise, review of systems negative.   ADMISSION LABORATORY DATA:  PT 13.1, INR 0.9.  Sodium 138, potassium 5.5,  chloride 97, bicarb 31, BUN 37, creatinine 10.8, glucose 88, alkaline  phosphatase 115, SGOT 11, SGPT 16, total protein 7.1, albumin 3.4, calcium  9.3.  A CBC showed a white blood cell count of 4.6, hemoglobin 15.7,  hematocrit 47.2, platelets  171, MCV 113.3.  Last CD4 count was 500.   PHYSICAL EXAMINATION:  VITAL SIGNS:  Blood pressure 208/110, pulse 78,  respirations 18, temperature 97.9.  GENERAL:  Drowsy, no apparent distress.  HEENT:  Normocephalic, atraumatic.  PERRL.  EOMI.  Oropharynx is clear.  There is a dressing anteriorly on his neck that is clean, dry, and intact.  CHEST:  Lungs are clear to auscultation bilaterally with good air movement.  HEART:  Regular rate and rhythm.  Normal S1, S2.  There is a slight murmur  which is likely coming from his AV fistula.  ABDOMEN:  Soft, nontender, nondistended, with normal active bowel sounds.  EXTREMITIES:  No clubbing, edema, or cyanosis.  There is an AV fistula in  the left forearm with a good thrill and bruit.  He has a pressure dressing  intact to the right forearm.   ASSESSMENT AND PLAN:  The patient is a 50 year old male with end-stage renal  disease currently postoperative day #1 from parathyroidectomy.   1. End-stage renal disease:  The patient is at increased risk for     electrolyte disturbances from his parathyroidectomy. Given this, we will     check his potassium, phosphorus, and calcium q.12h. for at  least 48     hours.  Specifically, we will be looking for hyperkalemia and     hypocalcemia, as well as hyperphosphatemia.  We will put him back on his     regular dialysis schedule.  He will be dialyzed tomorrow with a goal of a     3 kg weight loss.  He will get Zemplar 20 mcg three times a week prior to     his dialysis treatments.  2. Anemia:  The patient will continue on Epogen 7000 units three times per     week.  He is actually not anemic according to our parameters at present.     He does have an elevated MCV which is likely secondary to his     antiretroviral therapy.  3. Hypertension:  This may be secondary to pain postoperatively.     Additionally, he was running blood pressures with the systolics in the     150s to 160s prior to his operation so he will likely need medication     adjustment.  I will start him on some clonidine 0.1 mg q.1h. p.r.n.     systolic blood pressure greater than 180 and diastolic blood pressure     greater than 105 temporarily.  Once his pain is well controlled, if he     continues to have elevated blood pressures, we will likely need to alter     his antihypertensive regimen.   Thank you for this consult.  We will follow him with you.     Hillery Aldo, M.D.                      Mindi Slicker. Lowell Guitar, M.D.    CR/MEDQ  D:  06/14/2002  T:  06/15/2002  Job:  [id number]

## 2010-09-24 NOTE — Discharge Summary (Signed)
   Luis Reed, Luis Reed                        ACCOUNT NO.:  [id number]   MEDICAL RECORD NO.:  [id number]                   PATIENT TYPE:  INP   LOCATION:  6799                                 FACILITY:  [facility]   PHYSICIAN:  Maree Krabbe, M.D.             DATE OF BIRTH:  05/13/1960   DATE OF ADMISSION:  01/03/2003  DATE OF DISCHARGE:  01/04/2003                                 DISCHARGE SUMMARY   ADMISSION DIAGNOSES:  1. Hyperkalemia.  2. End-stage renal disease on chronic hemodialysis.  3. Human immunodeficiency virus positive.  4. Back pain.   DISCHARGE DIAGNOSES:  1. Hyperkalemia corrected with emergency dialysis.  2. End-stage renal disease on chronic hemodialysis.  3. Human immunodeficiency virus positive.  4. Back pain, suspect musculoskeletal.   BRIEF HISTORY:  50 year old black male with end-stage renal disease  secondary to HIV nephropathy who also is hepatitis C positive and has  uncontrolled high blood pressure, was sent to the emergency room after  dialysis for persistent mid back pain.  He has been taking Flexeril and  Vicodin.  Plain films that were done a week ago were negative.  In the  emergency room he was found to have a potassium of 7.1 and we admitted him  for emergency dialysis.   LABS ON ADMISSION:  White count 7700, hemoglobin 16.2, platelets 166,000.  Sodium 137, potassium 7.1, chloride 95, CO2 28, BUN 74, creatinine 16.1.   HOSPITAL COURSE:  The patient underwent emergency dialysis that night,  dialyzing for three hours.  Two liters of ultrafiltrate were removed.  He  dialyzed on a zero potassium bath for two hours.  He was discharged the  following morning to return for dialysis at the [facility] every Tuesday, Thursday, Saturday.  His medications are unchanged.  [facility] dialysate bath at [facility] remains a 2.0 potassium bath.      Zenovia Jordan, P.A.                      Maree Krabbe, M.D.    RRK/MEDQ  D:   02/16/2003  T:  02/16/2003  Job:  [id number]

## 2010-09-24 NOTE — Discharge Summary (Signed)
   NAMEBIRCH, Luis Reed                        ACCOUNT NO.:  [id number]   MEDICAL RECORD NO.:  [id number]                   PATIENT TYPE:  INP   LOCATION:  5504                                 FACILITY:  [facility]   PHYSICIAN:  Velora Heckler, M.D.                DATE OF BIRTH:  01/14/1961   DATE OF ADMISSION:  06/14/2002  DATE OF DISCHARGE:  06/18/2002                                 DISCHARGE SUMMARY   REASON FOR ADMISSION:  Secondary hyperparathyroidism, end-stage renal  disease.   BRIEF HISTORY:  The patient is a 50 year old black male presenting at the  request of Dr. Terrial Rhodes for evaluation of secondary  hyperparathyroidism as a consequence of end-stage renal disease.  The  patient has been on hemodialysis for 2-1/2 years.  The patient has developed  abnormalities in calcium and phosphorus metabolism.  Bio-Intact PTH levels  are elevated at 480.  The patient now comes to surgery for neck exploration  and parathyroidectomy.   HOSPITAL COURSE:  The patient was admitted on 06/14/2002.  He underwent a  total parathyroidectomy with autotransplantation of parathyroid tissue to  the forearm.  Postoperatively, the patient was followed closely by renal  medicine and surgery.  He received calcium supplementation.  He received  hemodialysis during his inpatient stay.  Calcium levels fell significantly  and then stabilized.  The patient was prepared for discharged on the fourth  postoperative day.   DISCHARGE PLANNING:  The patient is discharged home 06/18/2002 in stable  condition.  No significant postoperative complications were encountered.  The patient will be followed closely by the renal medicine service during  his 3-day-a-week dialysis sessions.  The patient will be seen back at my  office at [facility] in 2-3 weeks for a wound check.   FINAL DIAGNOSIS:  Secondary hyperparathyroidism, end-stage renal disease.   CONDITION AT DISCHARGE:  Good.                                        Velora Heckler, M.D.    TMG/MEDQ  D:  08/13/2002  T:  08/14/2002  Job:  [id number]   cc:   Terrial Rhodes, M.D.  [address]  [place]  [place] [address]  Fax: [phone]

## 2010-09-24 NOTE — Discharge Summary (Signed)
[place]  Patient:    Luis Reed, Luis Reed                     MRN: [id number] Adm. Date:  [id number] Disc. Date: [id number] Attending:  Anise Salvo Dictator:   Myles Rosenthal, M.D. CC:         Outpatient Clinic             Duke Salvia. Eliott Nine, M.D.             Dewayne Shorter, M.D.                           Discharge Summary  DISCHARGE DIAGNOSES: 1. Status post cerebrovascular accident secondary to hypotension, question    cocaine induced. 2. Seizure secondary to #1. 3. Human immunodeficiency virus diagnosed January, 2001.  CD4 count 310.  Not    currently on heart therapy. 4. Hypertension diagnosed in 1998. 5. End-stage renal disease, started hemodialysis October 13, 1999, status post Ash    catheter placement, October 11, 1999.  Hemodialysis Tuesday, Thursday,    Saturday, at [facility]. 6. Anemia, on Epogen and iron. 7. Polysubstance abuse.  DISCHARGE MEDICATIONS: 1. Phenobarbital 200 mg p.o. q.h.s. 2. Nephro-Vite one tablet q.d. 3. Calcium carbonate 500 mg one p.o. with meals. 4. Epogen 5000 units three times a week after hemodialysis. 5. InFeD 1 mg IV after hemodialysis for nine doses, then q. week. 6. Calcijex 1.0 mcg IV with hemodialysis.  FOLLOW-UP:  Patient will follow up with Dr. Cliffton Asters, infectious disease, as scheduled for Monday, October 25, 1999.  Patient is to continue with dialysis at [facility] Tuesday, Thursday, Saturday, at 7:00 a.m.  Follow up with [facility] as previously arranged.  Patient will likely need to continue with phenobarbital indefinitely given his abnormal CT scan which will continue to be seizure foci.  PROCEDURES:  Patient had an echocardiogram performed on October 15, 1999, transthoracic, with severe concentric left ventricular hypertrophy.  There was no evidence of SBE, vegetation, thrombus, or source of embolus.  Ejection fraction was mildly reduced at 45 to  55%, and there was mild diffuse hypokinesis in the left ventricle.  Patient had CT on October 14, 1999, which showed bilateral hemispheric lesions with the most significant changes ion the left parieto-occipital region.  Differential included opportunistic infection, multifocal intracranial neoplasm versus multiple infarcts.  MR was obtained thereafter which was limited due to extreme patient agitation which was suggestive of multiple acute embolic infarcts bilaterally.  MRA of the brain was performed on October 21, 1999, which was negative for lesions in the intracranial or extracranial circulation.  No vasculitis.  A CT with contrast media was performed on October 15, 1999, which revealed stable-appearing multifocal cortical and subcortical low-density lesions without hemorrhage or enhancement suggestive of infarcts.  CONSULTANTS:  Cynthia B. Eliott Nine, M.D., nephrology, and Dewayne Shorter, M.D., infectious disease.  HISTORY OF PRESENT ILLNESS:  This is a 50 year old black male with recently diagnosed HIV and end-stage renal disease doing relatively well until his hemodialysis the day prior to admission.  Since then, he had loss of balance, disequilibrium, and confusion.  Today, he ate dinner and, at 10:00 p.m., began to have generalized seizure per his girlfriend.  He had minimal foaming of mouth and moved all four extremities.  He was incontinent of stool.  EMS was called and patient was confused and combative.  In  the emergency department, patient was able to state his name but had general tonic-clonic seizure witnessed in the emergency department.  Seizure was controlled with a total of 16 mg of Ativan.  PHYSICAL EXAMINATION:  VITAL SIGNS:  Temperature 98.9, pulse 98, blood pressure 192/108, respirations 18, O2 saturations 97% on room air.  GENERAL:  He is sleeping soundly, opens eyes to stimulation, follows commands occasionally.  HEENT:  Pupils are 2 to 3 mm, reactive.  NECK:   Supple.  Negative bruits.  No nuchal rigidity.  LUNGS:  Clear to auscultation bilaterally.  CARDIAC:  Tachycardic with a 2/6 systolic murmur at the left sternal border. No murmurs, rubs, or gallops.  ABDOMEN:  Soft, nondistended with positive bowel sounds.  NEUROLOGIC:  Moves all four extremities, but unable to assess neurologic totally secondary to postictal versus sedation.  ADMISSION LABORATORIES:  White count 10.0 with an ______ of 5.2, hemoglobin 9.4, MCV of 98, platelets 141.  BMET is sodium 144, potassium 5.3, chloride 109, CO2 17, BUN 57, creatinine 10.1, glucose 116.  AST is 26, ALT 20, alkaline phosphatase 77, bilirubin 0.3, total protein 7.0, and albumin 2.7.  HOSPITAL COURSE: #1 - SEIZURE:  Patient was admitted and started on Dilantin and admitted on to the ICU with seizure precautions.  Patient had no further seizure activity after his initial presentation to the emergency room.  Patients anticonvulsant therapy was changed from Dilantin to phenobarbital secondary to concerns about possible Dilantin fever and patient did well on phenobarbital 200 mg p.o. q.h.s. and this is what he was discharged on.  Patient will likely need this indefinitely secondary to his abnormal CT scan.  #2 - STATUS POST CEREBROVASCULAR ACCIDENT:  Cause of patients decreased mental status and seizures not immediately known.  Differential diagnosis included possible septic emboli versus coronary emboli, carotid emboli.  Also included were possible infection such as TB, ______, also lymphoma.  Patient was unable to hold still for MRI/MRA despite significant amounts of sedation; however, CT with contrast was performed on hospital day #2 which revealed no ring-enhancing lesions.  It was felt that his imaging was most consistent with watershed infarct.  MRA was performed to rule out vasculitis which was negative.  Family members did suggest that patient had been using cocaine as  recently as three to  four days prior to admission and it was thought that cocaine-induced hypotension may have been the etiology for his strokes.  The patient did remarkably well and has made a full recovery, has no focal neurologic deficits at the time of his discharge.  #3 - INFECTIOUS DISEASE:  Patient had a significant fever during his hospitalization with a T max of 104.  At this point, patient was treated with Zosyn.  All blood cultures were negative.  Only culture which grew an organism was urine culture which was positive for greater than 100,000 colonies of pansensitive enterococcus.  At this point, his antibiotic coverage was narrowed from Zosyn to ampicillin and patient continued to do well.  Patients white blood cell count dropped and his fever curve decreased while on ampicillin.  Infectious disease was consulted and consideration was given to the fact that his fever may have purely been central in origin secondary to his recent CVA.  Fever could have also been secondary to his UTI but this was thought that, perhaps, this only represented colonization rather than truly infection.  Other etiology for the fever was Dilantin therapy, which was changed to phenobarbital at roughly the same time as  his downward trend of his fever.  Patient was afebrile for greater than 48 hours prior to discharge.  He had no signs or symptoms of active infection.  #4 - END-STAGE RENAL DISEASE:  Patient was followed by Dr. Eliott Nine from [facility] as well as Dr. Darrick Penna, and patient received hemodialysis Tuesday, Thursday, Saturday, and as needed during his hospitalization.  Patient will continue Tuesday, Thursday, Saturday hemodialysis after his discharge. DD:  10/23/99 TD:  10/26/99 Job: [id number]

## 2010-09-24 NOTE — Op Note (Signed)
NAME:  Luis Reed, Luis Reed                        ACCOUNT NO.:  [id number]   MEDICAL RECORD NO.:  [id number]                   PATIENT TYPE:  INP   LOCATION:  2550                                 FACILITY:  [facility]   PHYSICIAN:  Velora Heckler, M.D.                DATE OF BIRTH:  August 27, 1960   DATE OF PROCEDURE:  06/14/2002  DATE OF DISCHARGE:                                 OPERATIVE REPORT   PREOPERATIVE DIAGNOSES:  1. Secondary hyperparathyroidism.  2. End-stage renal disease.   POSTOPERATIVE DIAGNOSES:  1. Secondary hyperparathyroidism.  2. End-stage renal disease.   PROCEDURES:  1. Total parathyroidectomy.  2. Autotransplantation to right forearm.   SURGEON:  Velora Heckler, M.D.   ASSISTANT:  Angelia Mould. Derrell Lolling, M.D.   ANESTHESIA:  General.   ESTIMATED BLOOD LOSS:  Minimal.   PREPARATION:  Betadine.   COMPLICATIONS:  None.   INDICATIONS:  The patient is a 50 year old black male who presents at the  request of Terrial Rhodes, M.D., for management of secondary  hyperparathyroidism.  The patient was on hemodialysis for 2-1/2 years.  End-  stage renal disease is due to hypertension.  The patient has had elevated  calcium times phosphorus products and a bio-intact PTH level of 480.  The  patient now comes to surgery for exploration.   DESCRIPTION OF PROCEDURE:  The procedure was done in OR #1 at the [facility].  The patient is brought to the operating room and  placed in a supine position on the operating room table.  Following the  administration of general anesthesia, the patient is prepped and draped in  the usual strict aseptic fashion.  After ascertaining that an adequate level  of anesthesia had been obtained, a Kocher incision is made with a #10 blade.  Dissection is carried down through the platysma.  Skin flaps are elevated  cephalad and caudad from the thyroid notch to the sternal notch.  Hemostasis  is obtained with the electrocautery.   Two external jugular veins are ligated  in continuity and divided.  A Mahorner self-retaining retractor is placed  for exposure.  Strap muscles are incised in the midline.  Dissection is  begun on the left side.  Strap muscles are reflected laterally.  The left  thyroid lobe is dissected out.  It appears normal.  Dissection is carried  around to the carotid sheath and to the tracheoesophageal groove and  anterior fascia of the cervical spine.  Thorough exploration is performed.  A left inferior parathyroid gland is initially identified.  It is dissected  out carefully, taking care to preserve surrounding structures.  Vascular  pedicle is divided between small Lligaclips.  The gland is excised  completely and measured on the back table.  It measures 1.0 x 0.8 x 0.6 cm  in size.  Frozen section biopsy by Dr. Laureen Ochs confirms parathyroid tissue.  Further dissection  in the left neck reveals a superior parathyroid gland  adherent to the posterior aspect of the left upper lobe of the thyroid.  This is carefully dissected off.  Again vascular pedicles divided between  small ligaclips, and the gland is completely excised.  It measures 1.2 x 1.0  x 0.4 cm in size.  Again frozen section biopsy confirms the presence of  parathyroid tissue.  A dry pack is placed in the left neck.  Good hemostasis  is noted.  We then turned our attention to the right side.  Again strap  muscles are reflected laterally.  Right lobe of the thyroid gland is rolled  anteriorly.  Small venous tributaries are divided between small ligaclips.  Dissection in the tracheoesophageal groove again reveals a small parathyroid  gland adherent to the esophagus.  This is carefully dissected out, taking  care to preserve the recurrent laryngeal nerve.  The vascular pedicles  divided between small ligaclips, and the gland is excised.  This is a right  inferior parathyroid gland measuring 1.1 x 0.6 x 0.4 cm.  Frozen section  biopsy again  confirms parathyroid tissue.  Finally we explored the upper  right tracheoesophageal groove and right upper lobe of the thyroid gland for  a superior gland.  A superior parathyroid gland was identified, again on the  posterior aspect of the upper pole of the right lobe of the thyroid.  It was  carefully dissected off.  Vascular pedicle was divided between small  ligaclips and the gland was completely excised.  It measures 1.2 x 1.0 x 0.3  cm in size.  Again frozen section biopsy by Dr. Laureen Ochs confirms parathyroid  tissue.  The neck is irrigated.  Good hemostasis is noted.  Small pieces of  Surgicel are placed on both sides of the neck over the area of the recurrent  nerve and the location of the previous parathyroid glands.  Strap muscles  are reapproximated in the midline with interrupted 3-0 Vicryl sutures.  Platysma is reapproximated with interrupted 3-0 Vicryl suture.  Skin edges  are reapproximated with widely-spaced stainless steel staples and  interspaced half-inch Steri-Strips.   Next the field was taken down.  The right forearm was placed on an arm board  and the right upper extremity was prepped and draped in the usual strict  aseptic fashion.  After ascertaining that adequate anesthesia had been  maintained, a 5 cm incision is made over the right brachioradialis muscle.  Skin flaps are developed and a Weitlaner retractor is placed for exposure.  The left inferior parathyroid gland is placed on the table in iced saline.  It is sectioned into small 1 mm fragments.  Ten of these fragments are then  implanted into the right brachioradialis muscle by incising the fascia,  splitting the underlying muscle, inserting the fragmented parathyroid  tissue, and closing the fascia with 4-0 Prolene sutures.  This exercise is  repeated 10 times.  Good hemostasis is noted.  No complications are encountered.  Subcutaneous tissues are closed with interrupted 3-0 Vicryl  sutures.  The skin is closed  with a running 4-0 Vicryl subcuticular suture.  The wound is washed and dried and Steri-Strips are applied.  Sterile gauze  dressings are applied.  Kerlix is applied.  Ace wrap is applied.  Sterile  gauze dressings are applied to the neck.  The patient is awakened from  anesthesia and brought to the recovery room in stable condition.  The  patient tolerated the procedure well.  Velora Heckler, M.D.    TMG/MEDQ  D:  06/14/2002  T:  06/14/2002  Job:  [id number]   cc:   Terrial Rhodes, M.D.  [address]  [place]  [place] [address]  Fax: [phone]

## 2010-09-24 NOTE — Op Note (Signed)
[facility]  Patient:    Luis Reed, Luis Reed                     MRN: [id number] Proc. Date: 12/03/99 Adm. Date:  [id number] Disc. Date: [id number] Attending:  Bennye Alm CC:         Di Kindle. Edilia Bo, M.D.                           Operative Report  PREOPERATIVE DIAGNOSIS:  Chronic renal failure.  POSTOPERATIVE DIAGNOSIS:  Chronic renal failure.  OPERATION PERFORMED:  Placement of left forearm arteriovenous fistula.  SURGEON:  Di Kindle. Edilia Bo, M.D.  ASSISTANT:  Sherrie George, P.A.  ANESTHESIA:  Local with sedation.  DESCRIPTION OF PROCEDURE:  The patient was taken to the operating room and sedated by anesthesia.  The entire left upper extremity was prepped and draped in the usual sterile fashion.  After the skin was anesthetized with 1% lidocaine, a longitudinaly incision was made over the cephalic vein and the vein was dissected free.  It was ligated distally and distended up with heparinized saline.  It was a large vein.  Through a separate longitudinal incision after the skin was anesthetized, the radial artery was dissected free and the patient was then heparinized with 5000 units of IV heparin.  A tunnel was created between the two incisions.  The vein was mobilized over and sewn end-to-side to the radial artery as follows.  The artery was clamped proximally and distally and a longitudinal arteriotomy was made.  The vein was cut to the appropriate length and spatulated and sewn end-to-side to the radial artery using continuous 6-0 Prolene suture.  At the completion there was a good thrill in the fistula.  Hemostasis was obtained in the wound and the wound was closed in a deep layer of 3-0 Vicryl and the skin closed with 4-0 Vicryl.  Sterile dressing was applied.  The patient tolerated the procedure well and was transferred to the recovery room in satisfactory condition.  All sponge and needle counts were  correct. DD:  12/03/99 TD:  12/06/99 Job: [id number]

## 2010-09-24 NOTE — Op Note (Signed)
[place]  Patient:    ARLYNN, MCDERMID                     MRN: [id number] Proc. Date: 10/11/99 Adm. Date:  [id number] Attending:  Anise Salvo                           Operative Report  PREOPERATIVE DIAGNOSIS:  End-stage renal disease.  POSTOPERATIVE DIAGNOSIS:  End-stage renal disease.  PROCEDURE:  Placement of right IJ Ash catheter.  SURGEON:  Larina Earthly, M.D.  ASSISTANT:  Nurse.  ANESTHESIA:  One percent lidocaine local and IV sedation.  COMPLICATIONS:  None.  DISPOSITION:  To recovery room stable.  DESCRIPTION OF PROCEDURE:  The patient was taken to the operating room and placed on the operating table where the area of the right and left neck, and chest were prepped and draped in the usual sterile fashion.  The patient was placed in the Trendelenburg position.  Using local anesthesia and the finder needle, the right internal jugular vein was identified.  Next, using the Seldinger technique, the guidewire was passed down to the level of the right atrium.  This was confirmed with fluoroscopy.  _____ incision as made over the supraclavicular area and a tunnel was created from this site to the guidewire entry site.  A 24 cm Ash catheter was brought through the tunnel.  The dilator and peel-away sheath was passed over the guidewire.  The dilator and guidewire were removed.  The catheter was passed down the peel-away sheath and the sheath was removed.  Both lumens flushed and aspirated easily ________.  The catheter was secured to the skin with 3-0 nylon stitch and ________ was closed with 4-0 subcuticular Vicryl stitch.  A sterile dressing was applied and the patient was taken to the recovery room in stable condition. DD:  10/11/99 TD:  10/14/99 Job: [id number]

## 2010-09-24 NOTE — Consult Note (Signed)
[facility]  Patient:    Luis Reed, Luis Reed                    MRN: [id number] Proc. Date: 10/14/99 Attending:  Duke Salvia. Eliott Nine, M.D. Dictator:   Duke Salvia. Eliott Nine, M.D.                          Consultation Report  HISTORY OF PRESENT ILLNESS:  The patient is a 50 year old black male with a history of severe hypertension not well controlled for at least the past 2-3 years secondary to noncompliance with medications.  Newly diagnosed HIV, not yet requiring therapy, and progressive renal insufficiency.  He had a creatinine of 4.2 in 1999, 10 and 11 cm kidneys bilaterally, a polyclonal increase in globulins, 4+ protein on urinalysis.  A biopsy was contemplated, but there was no follow up.  He was referred back to our practice again this year after Dr. Orvan Falconer saw him for his HIV, and noted his creatinine was elevated at 9.  We saw him in the office on May 30, at which time his BUN was 96, creatinine 12, and hemoglobin 8.6.  Repeat ultrasound in comparison to the previous study showed that kidneys were now both smaller in size, measuring about 9 cm each.  ANA was negative.  He had a persistent polyclonal increase in globulin.  Hepatitis B surface antigen was negative, and antibody positive. Hepatitis A was positive, and C pending.  He was not symptomatic to any great degree at that time, and specifically denied headaches, visual disturbance, anorexia, nausea, vomiting, chest pain, shortness of breath, abdominal pain, edema, hematuria, dysuria, or change in voiding habits.  He underwent placement of an Ash catheter on June 4, and on June 5, underwent his first dialysis using an F70 dialyser four hours at a low blood flow rate of 200.  There were no problems noted during the treatment other than persistent elevation in blood pressure.  According to his sister, he was his usual self after dialysis on June 5 and all day on June 6, until his girlfriend  noted that he was acting funny and calling out to her that "I must of eaten something I shouldnt have", then he had the grandmal seizure.  The nursing staff, however, says that his mother, whom I have not been able to talk to, says he was "not quite right" since his first dialysis.  Therefore, information is conflicting and the patient is not able to give his own history.  PAST MEDICAL HISTORY: 1. Chronic renal failure progressed to end-stage renal disease secondary to    hypertension versus FSGF as above. 2. Severe hypertension. 3. History of hepatitis B. 4. Recent diagnosis of HIV with CD4 count of over 300, and low viral load. 5. Hepatitis A positive. 6. Prior history of ethanol, but none since 1999. 7. History of tobacco abuse, but none for 15 years. 8. History of some sort of testicular surgery at age 41.  FAMILY HISTORY:  Noncontributory.  SOCIAL HISTORY:  The patient lives with his girlfriend who is also HIV positive.  Has a 15 year old daughter who is in good health.  He works full-time 16 hours a day at the [employer].  REVIEW OF SYSTEMS:  Unobtainable due to agitation.  PHYSICAL EXAMINATION:  GENERAL:  He is agitated and restrained.  VITAL SIGNS:  Blood pressure 180/105, heart rate is 130 and regular.  He has intermittently Cheyne-Stokes  breathing, and then become very agitated and fights at his restraints.  His right-sided Ash catheter has a lose dressing with some blood at the exit site.  He has blood in his mouth secondary to biting his tongue.  LUNGS:  Rhonchi anteriorly, but no definite rales.  CARDIAC:  He is tachycardic, 120 to 130, S1, S2, positive S4, 1/6 murmur left sternal border, no diastolic murmur, no pericardial rub.  ABDOMEN:  Bowel sounds are present, but due to straining and agitation further examination is somewhat limited.  EXTREMITIES:  No edema.  LABORATORY DATA:  Sodium 144, potassium 5.3, chloride 109, CO2 17, BUN 57, creatinine  10.1.  Liver function tests are normal.  White count around 10,000, hemoglobin 9.4.  CT of the brain shows multiple bilateral subcortical and cortical lesions especially in the parietal occipital region.  MRI is pending.  IMPRESSION:  A young gentleman with ESR edema, most likely on the basis of hypertension, who is also HIV positive, now with new onset grandmal seizures with a very abnormal brain CT.  Workup is proceeding for tumor infection, infarct, etc.  "Dialysis disequilibrium" has been raised as a differential diagnosis.  This would be the more appropriate sequence would be perhaps a lowered seizure threshold secondary to his renal failure and perhaps the affects of the first dialysis.  Although his treatment was long and slow, and according to at least one family member, these symptoms did not develop until over 24 hours later.  PLAN:  We will continue with gentle regular hemodialysis today after his MRI. Agree with controlling his hypertension initially with intravenous and p.o. labetalol versus whatever medications it takes to achieve a relatively normal systolic and diastolic blood pressure.  We will continue his EPO.  Start multivitamins and Os-Cal when he can take p.o., and follow closely with you.DD: 10/14/99 TD:  10/14/99 Job: [id number]

## 2011-01-28 LAB — T-HELPER CELL (CD4) - (RCID CLINIC ONLY): CD4 T Cell Abs: 340 — ABNORMAL LOW

## 2011-01-29 ENCOUNTER — Inpatient Hospital Stay (HOSPITAL_COMMUNITY)
Admission: EM | Admit: 2011-01-29 | Discharge: 2011-02-01 | DRG: 391 | Disposition: A | Discharge status: Home / Self Care | Payer: Medicare Other | Attending: Internal Medicine | Admitting: Internal Medicine

## 2011-01-29 ENCOUNTER — Encounter: Payer: Self-pay | Admitting: Internal Medicine

## 2011-01-29 ENCOUNTER — Emergency Department (HOSPITAL_COMMUNITY): Payer: Medicare Other

## 2011-01-29 DIAGNOSIS — Z833 Family history of diabetes mellitus: Secondary | ICD-10-CM

## 2011-01-29 DIAGNOSIS — N390 Urinary tract infection, site not specified: Secondary | ICD-10-CM | POA: Diagnosis present

## 2011-01-29 DIAGNOSIS — A088 Other specified intestinal infections: Principal | ICD-10-CM | POA: Diagnosis present

## 2011-01-29 DIAGNOSIS — Z8249 Family history of ischemic heart disease and other diseases of the circulatory system: Secondary | ICD-10-CM

## 2011-01-29 DIAGNOSIS — IMO0002 Reserved for concepts with insufficient information to code with codable children: Secondary | ICD-10-CM

## 2011-01-29 DIAGNOSIS — E860 Dehydration: Secondary | ICD-10-CM | POA: Diagnosis present

## 2011-01-29 DIAGNOSIS — Z940 Kidney transplant status: Secondary | ICD-10-CM

## 2011-01-29 DIAGNOSIS — N179 Acute kidney failure, unspecified: Secondary | ICD-10-CM | POA: Diagnosis present

## 2011-01-29 DIAGNOSIS — J189 Pneumonia, unspecified organism: Secondary | ICD-10-CM | POA: Diagnosis present

## 2011-01-29 DIAGNOSIS — Z79899 Other long term (current) drug therapy: Secondary | ICD-10-CM

## 2011-01-29 DIAGNOSIS — I129 Hypertensive chronic kidney disease with stage 1 through stage 4 chronic kidney disease, or unspecified chronic kidney disease: Secondary | ICD-10-CM | POA: Diagnosis present

## 2011-01-29 DIAGNOSIS — L80 Vitiligo: Secondary | ICD-10-CM | POA: Diagnosis present

## 2011-01-29 DIAGNOSIS — N189 Chronic kidney disease, unspecified: Secondary | ICD-10-CM | POA: Diagnosis present

## 2011-01-29 DIAGNOSIS — Z21 Asymptomatic human immunodeficiency virus [HIV] infection status: Secondary | ICD-10-CM | POA: Diagnosis present

## 2011-01-29 DIAGNOSIS — E119 Type 2 diabetes mellitus without complications: Secondary | ICD-10-CM | POA: Diagnosis present

## 2011-01-29 LAB — DIFFERENTIAL
Basophils Relative: 0 % (ref 0–1)
Lymphs Abs: 0.9 10*3/uL (ref 0.7–4.0)
Monocytes Absolute: 1.5 10*3/uL — ABNORMAL HIGH (ref 0.1–1.0)
Monocytes Relative: 9 % (ref 3–12)
Neutro Abs: 14.4 10*3/uL — ABNORMAL HIGH (ref 1.7–7.7)
Neutrophils Relative %: 86 % — ABNORMAL HIGH (ref 43–77)

## 2011-01-29 LAB — COMPREHENSIVE METABOLIC PANEL
AST: 23 U/L (ref 0–37)
Albumin: 3.2 g/dL — ABNORMAL LOW (ref 3.5–5.2)
Alkaline Phosphatase: 74 U/L (ref 39–117)
Chloride: 98 mEq/L (ref 96–112)
Creatinine, Ser: 2.59 mg/dL — ABNORMAL HIGH (ref 0.50–1.35)
Potassium: 4 mEq/L (ref 3.5–5.1)
Sodium: 134 mEq/L — ABNORMAL LOW (ref 135–145)
Total Bilirubin: 0.9 mg/dL (ref 0.3–1.2)

## 2011-01-29 LAB — URINALYSIS, ROUTINE W REFLEX MICROSCOPIC
Glucose, UA: NEGATIVE mg/dL
Hgb urine dipstick: NEGATIVE
Ketones, ur: 15 mg/dL — AB
Protein, ur: 100 mg/dL — AB
Urobilinogen, UA: 0.2 mg/dL (ref 0.0–1.0)

## 2011-01-29 LAB — RAPID URINE DRUG SCREEN, HOSP PERFORMED
Barbiturates: NOT DETECTED
Benzodiazepines: NOT DETECTED
Cocaine: NOT DETECTED

## 2011-01-29 LAB — CBC
Platelets: 131 10*3/uL — ABNORMAL LOW (ref 150–400)
RDW: 13.1 % (ref 11.5–15.5)
WBC: 16.8 10*3/uL — ABNORMAL HIGH (ref 4.0–10.5)

## 2011-01-29 LAB — BASIC METABOLIC PANEL
CO2: 23 mEq/L (ref 19–32)
Calcium: 6.5 mg/dL — ABNORMAL LOW (ref 8.4–10.5)
Glucose, Bld: 120 mg/dL — ABNORMAL HIGH (ref 70–99)
Sodium: 134 mEq/L — ABNORMAL LOW (ref 135–145)

## 2011-01-29 LAB — URINE MICROSCOPIC-ADD ON

## 2011-01-29 LAB — PROCALCITONIN: Procalcitonin: 101.15 ng/mL

## 2011-01-29 NOTE — H&P (Signed)
Admission Date: 01/29/2011 Attending Pysician: Dr. Earley Brooke  First Contact: Dr Bosie Clos pager [phone] Second contact: Dr Loistine Chance pager [phone]  Weekends, [place], or after 5pm Weekdays:  1st Contact: [phone] 2nd Contact: [phone]  Chief Complaint: Diarrhea  HPI:   This is a 50 year old male with PMH significant for Renal Transplant in 2004, HIV, Hep C who presented to the ED with 2 day episode of Diarrhea. He noted that he has been having watery stool and is not able to keep up with any po intake since everything is going through. He denise any Nausea, Vomiting, abdominal pain but reports about a Temperature of 102 F yesterday. He mentioned that he took some antibiotic ( left over tablets from prior therapy) for possible UTI since he noticed that his urin was getting darker. He did not had any burning sensation with urination or increased frequency. He does not recall the name of the antibiotic. He denies any recent travel, sick contact, changes in diet and drinks bottle water only. He has been not hospitalized recently.   Allergies: Vicodin - causes Nausea and Vomiting  Past medical History: 1. HIV on therapy. Last CD 4 count in 08/2010 is 340, absolute CD 4 880, CD 4 % 32 , Viral load <20 (07/2010) 2. History of ESRD due to HTN and HIV , S/p renal transplant in 05/2007 at [facility]. Followed by Dr Arrie Aran 3. Hep C, Viral load 1120000 (03/2009) 4. HTN 5. Hx of CVA 6. Hx of Hep B, nonactive  7. Remote history of Syphilis, treated a teenager ?   Past surgical History: S/p renal transplant at [facility] in 05/2007    No current facility-administered medications on file as of 01/29/2011.   Medications Prior to Admission  Medication Sig Dispense Refill  . efavirenz (SUSTIVA) 600 MG tablet Take 1 tablet (600 mg total) by mouth daily.  30 tablet  9  . lamiVUDine-zidovudine (COMBIVIR) 150-300 MG per tablet Take 1 tablet by mouth 2 (two) times daily.  60 tablet  8  .  predniSONE (DELTASONE) 1 MG tablet Take 1 mg by mouth daily. Take 2 tablets by mouth every Monday, Wednesday and Friday.       . predniSONE (DELTASONE) 5 MG tablet Take 5 mg by mouth. One by mouth every mon, wed, fri       . raltegravir (ISENTRESS) 400 MG tablet Take 1 tablet (400 mg total) by mouth 2 (two) times daily.  60 tablet  8  . tacrolimus (PROGRAF) 1 MG capsule Take 1 mg by mouth. Take 3 tabs by mouth three times a day       . tacrolimus (PROGRAF) 5 MG capsule Take 5 mg by mouth 2 (two) times daily.          Family History: Mother has DM and HTN.   Social History. He lives in [place] with his wife. Family members are present in the ED ( Sister and daughter with grand children). He does not smoke. He was a heavy drinker in the past but quit in 2000. Denies any illicit drug use.    Results for orders placed during the hospital encounter of 01/29/11 (from the past 48 hour(s))  URINALYSIS, ROUTINE W REFLEX MICROSCOPIC     Status: Abnormal   Collection Time   01/29/11 11:40 AM      Component Value Range Comment   Color, Urine AMBER (*) YELLOW  BIOCHEMICALS MAY BE AFFECTED BY COLOR   Appearance CLOUDY (*) CLEAR  Specific Gravity, Urine 1.027  1.005 - 1.030     pH 5.5  5.0 - 8.0     Glucose, UA NEGATIVE  NEGATIVE (mg/dL)    Hgb urine dipstick NEGATIVE  NEGATIVE     Bilirubin Urine SMALL (*) NEGATIVE     Ketones, ur 15 (*) NEGATIVE (mg/dL)    Protein, ur 161 (*) NEGATIVE (mg/dL)    Urobilinogen, UA 0.2  0.0 - 1.0 (mg/dL)    Nitrite NEGATIVE  NEGATIVE     Leukocytes, UA MODERATE (*) NEGATIVE    URINE MICROSCOPIC-ADD ON     Status: Abnormal   Collection Time   01/29/11 11:40 AM      Component Value Range Comment   Squamous Epithelial / LPF FEW (*) RARE     WBC, UA 11-20  <3 (WBC/hpf)    Bacteria, UA FEW (*) RARE    DIFFERENTIAL     Status: Abnormal   Collection Time   01/29/11 11:40 AM      Component Value Range Comment   Neutrophils Relative 86 (*) 43 - 77 (%)    Neutro  Abs 14.4 (*) 1.7 - 7.7 (K/uL)    Lymphocytes Relative 5 (*) 12 - 46 (%)    Lymphs Abs 0.9  0.7 - 4.0 (K/uL)    Monocytes Relative 9  3 - 12 (%)    Monocytes Absolute 1.5 (*) 0.1 - 1.0 (K/uL)    Eosinophils Relative 0  0 - 5 (%)    Eosinophils Absolute 0.0  0.0 - 0.7 (K/uL)    Basophils Relative 0  0 - 1 (%)    Basophils Absolute 0.0  0.0 - 0.1 (K/uL)   CBC     Status: Abnormal   Collection Time   01/29/11 11:40 AM      Component Value Range Comment   WBC 16.8 (*) 4.0 - 10.5 (K/uL)    RBC 3.49 (*) 4.22 - 5.81 (MIL/uL)    Hemoglobin 13.5  13.0 - 17.0 (g/dL)    HCT 09.6 (*) 04.5 - 52.0 (%)    MCV 108.6 (*) 78.0 - 100.0 (fL)    MCH 38.7 (*) 26.0 - 34.0 (pg)    MCHC 35.6  30.0 - 36.0 (g/dL)    RDW 40.9  81.1 - 91.4 (%)    Platelets 131 (*) 150 - 400 (K/uL)   COMPREHENSIVE METABOLIC PANEL     Status: Abnormal   Collection Time   01/29/11 11:40 AM      Component Value Range Comment   Sodium 134 (*) 135 - 145 (mEq/L)    Potassium 4.0  3.5 - 5.1 (mEq/L)    Chloride 98  96 - 112 (mEq/L)    CO2 24  19 - 32 (mEq/L)    Glucose, Bld 111 (*) 70 - 99 (mg/dL)    BUN 34 (*) 6 - 23 (mg/dL)    Creatinine, Ser 7.82 (*) 0.50 - 1.35 (mg/dL)    Calcium 7.4 (*) 8.4 - 10.5 (mg/dL)    Total Protein 7.0  6.0 - 8.3 (g/dL)    Albumin 3.2 (*) 3.5 - 5.2 (g/dL)    AST 23  0 - 37 (U/L)    ALT 24  0 - 53 (U/L)    Alkaline Phosphatase 74  39 - 117 (U/L)    Total Bilirubin 0.9  0.3 - 1.2 (mg/dL)    GFR calc non Af Amer 26 (*) >60 (mL/min)    GFR calc Af Amer 32 (*) >60 (mL/min)  LACTIC ACID, PLASMA     Status: Normal   Collection Time   01/29/11 12:41 PM      Component Value Range Comment   Lactic Acid, Venous 1.2  0.5 - 2.2 (mmol/L)   PROCALCITONIN     Status: Normal   Collection Time   01/29/11 12:41 PM      Component Value Range Comment   Procalcitonin 101.15      Dg Chest 2 View  01/29/2011  *RADIOLOGY REPORT*  Clinical Data: Fever.  Diarrhea.  Right renal transplant in 2009.  CHEST - 2 VIEW  01/29/2011:  Comparison: Two-view chest x-ray 12/20/2006 and 04/27/2006 [facility].  Findings: Cardiac silhouette normal in size, unchanged.  Thoracic aorta tortuous, unchanged.  Hilar and mediastinal contours otherwise unremarkable.  Focal opacity localizing to the right middle lobe.  Lungs otherwise clear.  No pleural effusions. Pulmonary vascularity normal.  Visualized bony thorax intact.  IMPRESSION: Focal pneumonia suspected in the right middle lobe.  Original Report Authenticated By: Arnell Sieving, M.D.    Review of Systems  Constitutional: Positive for fever and malaise/fatigue.  HENT: Negative for neck pain.   Respiratory: Negative for cough, hemoptysis and shortness of breath.   Cardiovascular: Negative for chest pain and palpitations.  Gastrointestinal: Positive for diarrhea. Negative for nausea, vomiting, abdominal pain, constipation and blood in stool.  Genitourinary: Positive for urgency. Negative for dysuria and frequency.  Musculoskeletal: Negative for myalgias.  Skin: Negative for rash.  Neurological: Negative for dizziness and headaches.    There were no vitals taken for this visit. Physical Exam  Constitutional: He is oriented to person, place, and time. He appears well-developed. No distress.  HENT:  Head: Normocephalic and atraumatic.  Eyes: Pupils are equal, round, and reactive to light.  Neck: Neck supple.  Cardiovascular: Normal rate, regular rhythm and normal heart sounds.   Respiratory: Effort normal and breath sounds normal. No respiratory distress. He has no wheezes.  GI: Soft. Bowel sounds are normal. He exhibits no distension. There is no tenderness. There is no rebound.  Musculoskeletal: Normal range of motion.  Neurological: He is alert and oriented to person, place, and time. No cranial nerve deficit.  Skin: Skin is warm and dry. No rash noted. He is not diaphoretic.     Assessment/Plan  1. Diarrhea likely due to gastroenteritis unclear if  viral, bacterial or parasite induced especially in the setting of HIV although patient has an undetectable viral load and absolute CD4 count of 880 in 08/2010. Other DD include C.diff colitis especially in the setting of recent antibiotic therapy. Unlikely HIV medication induced since it is more acute and again unlikely Lactose intolerance, celiac disease or other chronic diarrhea since patient noted that this is his first episode.  PLAN: - Will admit to regular floor - Will get Stool evaluation including C.diff PCR, WBC, gram stain and culture, Ova & Parasite, Cryptoccocal and Giardia   - Will hydrate and start on regular diet   2. Acute on chronic Renal insufficiency baseline Creatinine between 1.3-1.5 likely due to volume depletion in the setting of diarrhea . Other DD include ATN due to Hypoperfusion unlikely or even rejection of transplant kidney which highly unlikely. No muddy cast seen on U/A.  Patient had normal kidney function 1 month ago when seen by Dr Arrie Aran.  PLAN - will get Urine Na, urine creatinine   - Will hydrate aggressively - Will continue Prograf and prednisone  - Renal consulted in the ED - Will continue  to monitor closely  3. Urine tract infection PLAN Will start Rocephin. Await urine culture for possible changes in management.  4. HIV Last CD 4 count in 08/2010 is 340, absolute CD 4 880, CD 4 % 32 , Viral load <20 (07/2010) PLAN - Will check CD 4 count - Will continue home medication including Combivir, Sustiva and Isentress  5. Hypertension : Patient on no home medication. Blood pressure on the soft side.  PLAN - Will get orthostatic  BP  - Will hydrate aggressively  6. DVTppx: Lovenox   Kristie Cowman, MD [phone]   Almyra Deforest, MD [phone]   Attending Physician:  I have seen and examined the patient. I reviewed the resident note and agree with the findings and plan of care as documented. My additions and revisions are included.     Signature:___________________________________________   Almyra Deforest 01/29/2011, 3:18 PM

## 2011-01-30 DIAGNOSIS — N289 Disorder of kidney and ureter, unspecified: Secondary | ICD-10-CM

## 2011-01-30 DIAGNOSIS — B20 Human immunodeficiency virus [HIV] disease: Secondary | ICD-10-CM

## 2011-01-30 DIAGNOSIS — R197 Diarrhea, unspecified: Secondary | ICD-10-CM

## 2011-01-30 LAB — MAGNESIUM: Magnesium: 1.5 mg/dL (ref 1.5–2.5)

## 2011-01-30 LAB — URINE CULTURE: Culture  Setup Time: 201209221850

## 2011-01-30 LAB — BASIC METABOLIC PANEL
BUN: 31 mg/dL — ABNORMAL HIGH (ref 6–23)
BUN: 31 mg/dL — ABNORMAL HIGH (ref 6–23)
CO2: 21 mEq/L (ref 19–32)
CO2: 23 mEq/L (ref 19–32)
Calcium: 6.1 mg/dL — CL (ref 8.4–10.5)
Calcium: 6.3 mg/dL — CL (ref 8.4–10.5)
Creatinine, Ser: 2.37 mg/dL — ABNORMAL HIGH (ref 0.50–1.35)
Glucose, Bld: 109 mg/dL — ABNORMAL HIGH (ref 70–99)
Glucose, Bld: 131 mg/dL — ABNORMAL HIGH (ref 70–99)
Sodium: 139 mEq/L (ref 135–145)

## 2011-01-30 LAB — CBC
HCT: 31.7 % — ABNORMAL LOW (ref 39.0–52.0)
Hemoglobin: 11.1 g/dL — ABNORMAL LOW (ref 13.0–17.0)
MCH: 38 pg — ABNORMAL HIGH (ref 26.0–34.0)
MCV: 108.6 fL — ABNORMAL HIGH (ref 78.0–100.0)
RBC: 2.92 MIL/uL — ABNORMAL LOW (ref 4.22–5.81)

## 2011-01-30 LAB — FECAL LACTOFERRIN, QUANT

## 2011-01-30 NOTE — Consult Note (Signed)
Luis Reed, Luis Reed NO.:  [id number]  MEDICAL RECORD NO.:  [id number]  LOCATION:  5030                         FACILITY:  [facility]  PHYSICIAN:  Wilber Bihari. Caryn Section, M.D.   DATE OF BIRTH:  1961/01/18  DATE OF CONSULTATION:  01/30/2011 DATE OF DISCHARGE:                                CONSULTATION   Luis Reed is a 50 year old black man admitted with fever, dysuria, and diarrhea.  He has a positive HIV and renal transplant.  Creatinine was 2.59 on admission.  Creatinine usually runs about 1.3 in the office according to him.  He sees Luis Reed of [facility]. Renal consult was requested by Luis Reed.  PAST MEDICAL HISTORY: 1. ESRD (started dialysis in 2002). 2. Positive HIV. 3. Renal transplant in 2009 [facility]). 4. Four-gland parathyroidectomy with implanted remnant gland in the     right brachioradialis muscle.  MEDS PRIOR TO ADMISSION: 1. Sustiva 200 daily. 2. Combivir 150/300 b.i.d. 3. Isentress 400 b.i.d. 4. Prograf 8 mg b.i.d. 5. Prednisone 5 mg on Monday, Wednesday, and Friday.  CURRENT MEDICATIONS: 1. Sustiva 200 daily. 2. Combivir 150/300 b.i.d. 3. Isentress 400 b.i.d. 4. Prograf 8 mg b.i.d. 5. Prednisone 8 mg per day. 6. Maxipine 2 g IV daily. 7. Metronidazole 500 p.o. t.i.d. 8. Vancomycin IV.  REVIEW OF SYSTEMS:  Diarrhea, dysuria, fever.  No angina.  No claudication.  No melena.  No hematochezia.  No gross hematuria.  No renal colic.  No DOE.  No orthopnea.  No purulent sputum.  No hemoptysis.  No weight gain or weight loss.  SOCIAL HISTORY:  He grew up in [place].  He graduated from high school [employer]).  He also attended [employer].  He worked as a Surveyor, minerals, currently on disability.  He lives with his wife (married 11 years).  No cigarettes.  No alcohol.  No illegal drugs.  FAMILY HISTORY:  Father age 60 is well.  Mother age 86 is well.  Three brothers, two sisters are well.  Two daughters aged 16  and 24 are well.  PHYSICAL EXAMINATION:  GENERAL:  He is awake, alert, friendly, feels better than on admission. VITAL SIGNS:  Temperature 100, pulse 92, respirations 16, and blood pressure 110/70. NECK:  No JVD.  Scar from parathyroidectomy noted. CHEST:  Clear. HEART:  No rub.  Bruit from AV fistula heard over precordium. ABDOMEN:  Vitiligo in R lower quadrant (present since transplant). Transplant also noted in right lower quadrant, nontender. GU:  Uncircumcised penis.  Testes descended bilaterally. EXTREMITIES:  AV fistula in the left upper arm,  PTx remnant in the right brachioradialis muscle. NEURO:  Right handed.  Strength equal.  Sensation intact.  LABORATORY DATA:  Hemoglobin 11, WBC 15,200 (16,800 yesterday), and platelets 122,000.  Labs from January 29, 2011, showed BUN 31, creatinine 2.2 (34/2.6 on admission), potassium 3.9.  Today's BUN/Cr  pending.   Urinalysis on admission showed 11-20 wbc's and few bacteria. C. diff PCR negative.  Urine sodium 34.  FENa less than 1.   Chest x-ray shows minimal right middle lobe infiltrate.  IMPRESSION: 1. Fever with dysuria, diarrhea, and (?) RML infiltrate. 2. Renal transplant on immunosuppression. 3. Positive  HIV with immunosuppression. 4. Prerenal azotemia with elevated BUN and creatinine over baseline. 5. (?) right middle lobe infiltrate. 6.  Hypocalcemia  RECOMMENDATIONS: 1. Studies are pending (urine culture, stool O&P, Giardia, stool     culture, C. diff, blood cultures).  We will await results.     Currently on Maxipine, Flagyl, and vancomycin. 2. He says he takes prednisone 5 mg on Monday, Wednesday, and Friday,     currently on 8 mg a day.  I will check office records for dose.  We     will also need to check Prograf level (metronidazole may increase Prograf     level).  If it is not needed for C. diff, we would then discontinue. 3. Continue meds. 4. Continue IV fluids. 5. Recheck chest x-ray tomorrow.  May need  to change antibiotics. 6.  Check PTH from L hand or arm (remnant gland in R arm). PO CaCO3 1250 TID     Wilber Bihari. Caryn Section, M.D.     RFF/MEDQ  D:  01/30/2011  T:  01/30/2011  Job:  [id number]  Electronically Signed by Marina Gravel M.D. on 01/30/2011 06:44:02 PM

## 2011-01-31 ENCOUNTER — Inpatient Hospital Stay (HOSPITAL_COMMUNITY): Payer: Medicare Other

## 2011-01-31 LAB — RENAL FUNCTION PANEL
Albumin: 2.6 g/dL — ABNORMAL LOW (ref 3.5–5.2)
GFR calc Af Amer: 43 mL/min — ABNORMAL LOW (ref >60)
Glucose, Bld: 102 mg/dL — ABNORMAL HIGH (ref 70–99)
Phosphorus: 2.7 mg/dL (ref 2.3–4.6)
Potassium: 3.9 mEq/L (ref 3.5–5.1)
Sodium: 141 mEq/L (ref 135–145)

## 2011-01-31 LAB — CBC
Hemoglobin: 10.3 g/dL — ABNORMAL LOW (ref 13.0–17.0)
MCHC: 34.9 g/dL (ref 30.0–36.0)
RDW: 13.5 % (ref 11.5–15.5)
WBC: 9.5 10*3/uL (ref 4.0–10.5)

## 2011-01-31 LAB — ROTAVIRUS ANTIGEN, STOOL: Rotavirus: NEGATIVE

## 2011-01-31 LAB — GIARDIA/CRYPTOSPORIDIUM SCREEN(EIA): Giardia Screen - EIA: NEGATIVE

## 2011-02-01 LAB — PTH, INTACT AND CALCIUM
Calcium, Total (PTH): 6.2 mg/dL — CL (ref 8.4–10.5)
PTH: 28.6 pg/mL (ref 14.0–72.0)

## 2011-02-01 LAB — EHEC TOXIN BY EIA, STOOL: EHEC Toxin by EIA: NEGATIVE

## 2011-02-02 LAB — PTH, INTACT AND CALCIUM: PTH: 38.8 pg/mL (ref 14.0–72.0)

## 2011-02-02 LAB — HIV-1 RNA ULTRAQUANT REFLEX TO GENTYP+
HIV 1 RNA Quant: <20 copies/mL (ref <20)
HIV-1 RNA Quant, Log: <1.3 {Log} (ref <1.30)

## 2011-02-02 LAB — STOOL CULTURE

## 2011-02-03 LAB — DIFFERENTIAL
Basophils Absolute: 0
Basophils Relative: 0
Eosinophils Absolute: 0
Monocytes Absolute: 0.3
Monocytes Relative: 9
Neutrophils Relative %: 64

## 2011-02-03 LAB — BASIC METABOLIC PANEL
CO2: 21
Calcium: 7.1 — ABNORMAL LOW
Chloride: 108
Creatinine, Ser: 1.46
GFR calc Af Amer: >60
Glucose, Bld: 97

## 2011-02-03 LAB — URINALYSIS, ROUTINE W REFLEX MICROSCOPIC
Bilirubin Urine: NEGATIVE
Glucose, UA: NEGATIVE
Hgb urine dipstick: NEGATIVE
Specific Gravity, Urine: 1.025
pH: 6

## 2011-02-03 LAB — CBC
Hemoglobin: 10.9 — ABNORMAL LOW
MCHC: 34.2
MCV: 111.1 — ABNORMAL HIGH
RBC: 2.88 — ABNORMAL LOW
RDW: 14.9

## 2011-02-03 LAB — CULTURE, BLOOD (ROUTINE X 2)

## 2011-02-04 LAB — CULTURE, BLOOD (ROUTINE X 2)

## 2011-02-07 LAB — URINE MICROSCOPIC-ADD ON

## 2011-02-07 LAB — BASIC METABOLIC PANEL
BUN: 14
CO2: 24
Chloride: 108
Potassium: 4.2

## 2011-02-07 LAB — DIFFERENTIAL
Eosinophils Absolute: 0
Eosinophils Relative: 1
Lymphs Abs: 0.7
Monocytes Relative: 3

## 2011-02-07 LAB — URINE CULTURE

## 2011-02-07 LAB — URINALYSIS, ROUTINE W REFLEX MICROSCOPIC
Glucose, UA: NEGATIVE
Leukocytes, UA: NEGATIVE
Protein, ur: 30 — AB
Specific Gravity, Urine: 1.027
pH: 6

## 2011-02-07 LAB — POCT I-STAT, CHEM 8
BUN: 15
Calcium, Ion: 0.79 — ABNORMAL LOW
Creatinine, Ser: 1.7 — ABNORMAL HIGH
Glucose, Bld: 96
TCO2: 22

## 2011-02-07 LAB — CBC
HCT: 39.5
MCV: 107.3 — ABNORMAL HIGH
Platelets: 169
WBC: 3.9 — ABNORMAL LOW

## 2011-02-07 LAB — HEPATIC FUNCTION PANEL
AST: 17
Albumin: 3.7
Alkaline Phosphatase: 329 — ABNORMAL HIGH
Total Bilirubin: 0.6

## 2011-02-07 LAB — PROTIME-INR: Prothrombin Time: 14.9

## 2011-02-10 ENCOUNTER — Ambulatory Visit (INDEPENDENT_AMBULATORY_CARE_PROVIDER_SITE_OTHER): Payer: Medicare Other | Admitting: Internal Medicine

## 2011-02-10 ENCOUNTER — Encounter: Payer: Self-pay | Admitting: Internal Medicine

## 2011-02-10 VITALS — BP 134/73 | HR 90 | Temp 98.2°F | Ht 68.0 in | Wt 141.2 lb

## 2011-02-10 DIAGNOSIS — Z79899 Other long term (current) drug therapy: Secondary | ICD-10-CM

## 2011-02-10 DIAGNOSIS — Z23 Encounter for immunization: Secondary | ICD-10-CM

## 2011-02-10 DIAGNOSIS — Z113 Encounter for screening for infections with a predominantly sexual mode of transmission: Secondary | ICD-10-CM

## 2011-02-10 DIAGNOSIS — Z940 Kidney transplant status: Secondary | ICD-10-CM

## 2011-02-10 DIAGNOSIS — B20 Human immunodeficiency virus [HIV] disease: Secondary | ICD-10-CM

## 2011-02-10 NOTE — Progress Notes (Signed)
  Subjective:    Patient ID: Luis Reed, male    DOB: 1960/11/28, 50 y.o.   MRN: [id number]  HPI Denson is in for his hospital followup visit. He was admitted to the hospital last month with acute gastroenteritis and a slight bump in his creatinine over baseline. His creatinine on admission was 2.59 but came down to 2.01 on discharge. He was hydrated and treated with empiric Levaquin. Blood cultures and all stool studies were negative. After discharge he discussed the situation with one of his renal doctors, Dr. Arlean Hopping, and on September 30 he was placed on Augmentin for a possible urinary tract infection because he had pyuria on his admission urinalysis. Urine culture had been negative. He will complete Augmentin today. He is feeling much better. He has not had any further diarrhea and his appetite has returned to normal.  As usual he has not missed a single dose of his antiretroviral medications.    Review of Systems     Objective:   Physical Exam  Constitutional: He appears well-developed and well-nourished. No distress.  HENT:  Mouth/Throat: Oropharynx is clear and moist. No oropharyngeal exudate.  Eyes: Conjunctivae are normal.  Cardiovascular: Normal rate, regular rhythm and normal heart sounds.   No murmur heard. Pulmonary/Chest: Breath sounds normal. He has no wheezes. He has no rales.  Abdominal: Soft. Bowel sounds are normal. There is no tenderness.  Skin: No rash noted.  Psychiatric: He has a normal mood and affect.          Assessment & Plan:

## 2011-02-10 NOTE — Assessment & Plan Note (Signed)
I will recheck a metabolic panel today to see if his creatinine has returned to his normal baseline.

## 2011-02-10 NOTE — Assessment & Plan Note (Signed)
His infection remains under excellent control. His CD4 count is 570 his viral load remains undetectable at less than 20. I will repeat complete metabolic panel today. If his creatinine remains elevated he may need to have his Combivir broken down into his component parts of Retrovir and Epivir renally dosed. He received his influenza vaccination today.

## 2011-02-11 LAB — COMPREHENSIVE METABOLIC PANEL
ALT: 22 U/L (ref 0–53)
BUN: 19 mg/dL (ref 6–23)
CO2: 23 mEq/L (ref 19–32)
Calcium: 8.6 mg/dL (ref 8.4–10.5)
Chloride: 104 mEq/L (ref 96–112)
Creat: 1.44 mg/dL — ABNORMAL HIGH (ref 0.50–1.35)
Glucose, Bld: 103 mg/dL — ABNORMAL HIGH (ref 70–99)

## 2011-02-16 NOTE — Discharge Summary (Signed)
[name], [name] NO.:  [id number]  MEDICAL RECORD NO.:  [id number]  LOCATION:  5030                         FACILITY:  [facility]  PHYSICIAN:  Doneen Poisson, MD     DATE OF BIRTH:  Apr 19, 1961  DATE OF ADMISSION:  01/29/2011 DATE OF DISCHARGE:  02/01/2011                              DISCHARGE SUMMARY   DISCHARGE DIAGNOSES: 1. Gastroenteritis, likely viral. 2. Acute on chronic renal insufficiency secondary to dehydration. 3. History of end-stage renal disease with dialysis started in 2002,     now status post renal transplant since 2009. 4. Urinary tract infection. 5. Human immunodeficiency virus positive. 6. Hypertension. 7. Status post parathyroidectomy with remnant implantation.  DISCHARGE MEDICATIONS: 1. Tacrolimus 8 mg by mouth b.i.d. 2. Combivir 1 capsule p.o. b.i.d. 3. Isentress 400 mg p.o. b.i.d. 4. Prednisone 5 mg p.o. every Monday, Wednesday, Friday. 5. Sustiva  600 mg p.o. daily.  DISPOSITION AND FOLLOWUP:  Luis Reed was discharged in stable and improved condition with followup with Dr. Cliffton Asters, February 10, 2011 at 2:00 p.m.  Follow up with Dr. Arrie Aran from the Renal Service on  February 28, 2011 at 2:30 with prior renal labs to be drawn on February 07, 2011.  PROCEDURES: 1. January 29, 2011, chest x-ray which showed focal     pneumonia suspected in the right middle lobe.  2. January 31, 2011, repeat chest x-ray which showed     streaky atelectasis without definite focal infiltrates.  CONSULTATION: 1. Renal Service, Drs. Maple Mirza from [facility] 2. Infectious Disease, Dr. Ninetta Lights  BRIEF ADMITTING HISTORY AND PHYSICAL:  Mr. Luis Reed is a 50 year old man with a past medical history significant for HIV positive with a CD-4 count of 880 prior to admission.  history also significant for renal transplant in 2009 secondary to end-stage renal disease from HIV and hypertension.  He presented to the ED  with complaints of fever of 102 and watery diarrhea for the past 2 days.  He denied nausea, vomiting, or abdominal pain but reported dysuria and taking an unknown antibiotic left over from a prior treatment for a suspected UTI.  PHYSICAL EXAMINATION: VITAL SIGNS:  Temperature 99.2, blood pressure 109/70, pulse 70,  respirations 16, oxygen saturation 100% on room air. GENERAL:  Oriented to person, place, time, well-developed, no distress. HEAD AND NECK:  Normocephalic, atraumatic.  Pupils equal, round, and  reactive to light. NECK:  Supple. CARDIOVASCULAR:  Normal rate and rhythm.  No murmurs appreciated. RESPIRATORY:  Normal respiratory effort.  Breath sounds normal.  No  wheezes. GI:  Soft.  Bowel sounds normal.  No distention, tenderness or rebound. MUSCULOSKELETAL:  Normal range of motion. NEUROLOGIC:  Alert and oriented x 3.  No cranial nerve deficits. SKIN:  Warm and dry.  No rashes.  ADMISSION LABORATORY DATA:  Urinalysis showed a specific gravity of 1.027, small bilirubin, 15 ketones, 100 proteins, moderate leukocytes. Microscopy of the urine showed 11-20 white blood cells, few bacteria, and few squamous epithelial cells.  White blood cell count 16.8, hemoglobin 13.5, hematocrit 37.9, platelets 131, MCV 108.6, 86%  neutrophils.  Sodium 134, potassium 4.0, chloride 98, bicarb 24,  glucose 111,  BUN 34, creatinine 2.59, calcium 7.4, total protein 7.0,  albumin 3.2, AST 23, ALT 24, alkaline phosphatase 74, total bilirubin  0.9, eGFR 32  HOSPITAL COURSE: 1. Luis Reed was admitted for suspected gastroenteritis of unclear     etiology and given IV fluids for dehydration.  Given his reports of     recent antibiotic use and HIV status, C-difficile and     Cryptosporidium were high on our differential disgnoses. Stool     cultures were sent and he was started on Flagyl, Ciprofloxacin, and     Vancomycin. His CD-4 count was reassessed.  Diarrhea improved.  C.      difficile was  negative with positive lactoferrin and negative      Cryptosporidium and Giardia.  Infectious Disease was consulted     and ordered HIV-1 RNA quant which showed less than 20 copies/mL.       All antibiotics were stopped and additional stool studies showed      negative rotavirus and EHEC toxin for Shiga toxins. By discharge,      he was afebrile with complete resolution of his diarrhea.  2. Acute on chronic renal insufficiency.  Given his fractional excretion      of sodium less than 0.4%, he was felt to be pre-renal.  He was      treated with aggressive IV fluids and monitored.  Renal was consulted     secondary to his kidney transplant status.  They recommended     continuing his outpatient steroid regimen     along with IV fluids and checking the Prograf/tacrolimus level     which returned at 2.7.  By discharge, his creatinine had improved from      2.59 on admission to 2.01.  His PTH was 38.8.  This will be followed      up by Dr. Arrie Aran at his post-hospitalization appointment on February 28, 2011.  3. Luis Reed was initially felt to have a urinary tract     infection and started on Rocephin IV. Urine culture demonstrated      low growth of organisms, the Rocephin was discontinued and his symptoms      resolved by time of discharge.  Luis Reed was discharged with a temperature of 99.7, pulse 65, respirations 18, blood pressure 143/80, and oxygen saturation 95% on room air.  Sodium 141, potassium 3.9, chloride 113, bicarbonate 20, glucose  102, BUN 30, creatinine 2.01, eGFR 43, albumin 2.6, calcium 6.5, phosphorus  2.7.  White blood cell count 9.5, hemoglobin 10.3, hematocrit 29.5, MCV 108.5,  platelets 125.   ______________________________ Kristie Cowman, MD   ______________________________ Doneen Poisson, MD   KS/MEDQ  D:  02/09/2011  T:  02/09/2011  Job:  [id number]  cc:   Terrial Rhodes, M.D. Cliffton Asters, M.D.  Electronically Signed by Kristie Cowman MD  on 02/10/2011 02:44:32 PM Electronically Signed by Doneen Poisson  on 02/16/2011 11:24:00 AM

## 2011-02-17 LAB — POCT I-STAT 4, (NA,K, GLUC, HGB,HCT)
Glucose, Bld: 85
Operator id: 181601
Potassium: 4.9
Sodium: 138

## 2011-02-17 LAB — CULTURE, BLOOD (ROUTINE X 2): Culture: NO GROWTH

## 2011-02-17 LAB — DIFFERENTIAL
Basophils Absolute: 0
Basophils Relative: 0
Eosinophils Absolute: 0
Monocytes Absolute: 0.2
Neutro Abs: 17 — ABNORMAL HIGH
Neutrophils Relative %: 98 — ABNORMAL HIGH

## 2011-02-17 LAB — CBC
HCT: 40.6
Platelets: 140 — ABNORMAL LOW
RBC: 3.45 — ABNORMAL LOW
RDW: 23.3 — ABNORMAL HIGH
WBC: 13.2 — ABNORMAL HIGH

## 2011-02-17 LAB — RENAL FUNCTION PANEL
CO2: 21
Chloride: 106
GFR calc Af Amer: 4 — ABNORMAL LOW
GFR calc non Af Amer: 4 — ABNORMAL LOW
Potassium: 6.7
Sodium: 138

## 2011-02-17 LAB — CK TOTAL AND CKMB (NOT AT ARMC)
CK, MB: 2.3
Relative Index: INVALID

## 2011-02-18 LAB — RENAL FUNCTION PANEL
Albumin: 2.1 — ABNORMAL LOW
Albumin: 2.2 — ABNORMAL LOW
CO2: 24
Calcium: 6.2 — CL
Calcium: 7.5 — ABNORMAL LOW
Chloride: 102
Creatinine, Ser: 8.7 — ABNORMAL HIGH
GFR calc Af Amer: 5 — ABNORMAL LOW
GFR calc Af Amer: 6 — ABNORMAL LOW
GFR calc Af Amer: 8 — ABNORMAL LOW
GFR calc non Af Amer: 4 — ABNORMAL LOW
GFR calc non Af Amer: 7 — ABNORMAL LOW
Phosphorus: 3.9
Phosphorus: 4.8 — ABNORMAL HIGH
Potassium: 4.4
Sodium: 134 — ABNORMAL LOW
Sodium: 141

## 2011-02-18 LAB — CBC
Hemoglobin: 8.8 — ABNORMAL LOW
MCHC: 33.2
Platelets: 52 — ABNORMAL LOW
Platelets: 76 — ABNORMAL LOW
RBC: 2.24 — ABNORMAL LOW
RBC: 2.61 — ABNORMAL LOW
RDW: 14.3 — ABNORMAL HIGH
WBC: 3.6 — ABNORMAL LOW
WBC: 7.4

## 2011-02-18 LAB — CROSSMATCH
ABO/RH(D): O POS
Antibody Screen: NEGATIVE

## 2011-02-21 LAB — CBC
HCT: 31.5 — ABNORMAL LOW
MCHC: 32.7
MCV: 114.9 — ABNORMAL HIGH
Platelets: 100 — ABNORMAL LOW
Platelets: 85 — ABNORMAL LOW
RBC: 2.8 — ABNORMAL LOW
RDW: 14.8 — ABNORMAL HIGH
RDW: 14.9 — ABNORMAL HIGH
WBC: 4.7
WBC: 8.4

## 2011-02-21 LAB — PROTIME-INR: Prothrombin Time: 14.7

## 2011-02-21 LAB — EXPECTORATED SPUTUM ASSESSMENT W GRAM STAIN, RFLX TO RESP C

## 2011-02-21 LAB — CLOSTRIDIUM DIFFICILE EIA: C difficile Toxins A+B, EIA: NEGATIVE

## 2011-02-21 LAB — DIFFERENTIAL
Band Neutrophils: 10
Basophils Absolute: 0
Eosinophils Absolute: 0
Lymphs Abs: 0.1 — ABNORMAL LOW
Monocytes Absolute: 0 — ABNORMAL LOW
Monocytes Relative: 1 — ABNORMAL LOW
Neutro Abs: 4.1

## 2011-02-21 LAB — POCT I-STAT 3, ART BLOOD GAS (G3+)
Acid-base deficit: 2
Operator id: 270211
Patient temperature: 103
TCO2: 23
pH, Arterial: 7.385

## 2011-02-21 LAB — RENAL FUNCTION PANEL
CO2: 24
CO2: 24
Calcium: 5.3 — CL
Calcium: 7.4 — ABNORMAL LOW
Chloride: 105
Chloride: 106
GFR calc Af Amer: 5 — ABNORMAL LOW
GFR calc Af Amer: 5 — ABNORMAL LOW
GFR calc non Af Amer: 4 — ABNORMAL LOW
GFR calc non Af Amer: 4 — ABNORMAL LOW
Glucose, Bld: 116 — ABNORMAL HIGH
Potassium: 5.1
Potassium: 5.6 — ABNORMAL HIGH
Sodium: 139
Sodium: 140

## 2011-02-21 LAB — TSH: TSH: 0.451

## 2011-02-21 LAB — BASIC METABOLIC PANEL
CO2: 21
Calcium: 6.9 — ABNORMAL LOW
Creatinine, Ser: 14.75 — ABNORMAL HIGH
Glucose, Bld: 103 — ABNORMAL HIGH

## 2011-02-21 LAB — URINE CULTURE
Culture: NO GROWTH
Special Requests: NEGATIVE

## 2011-02-21 LAB — URINALYSIS, ROUTINE W REFLEX MICROSCOPIC
Bilirubin Urine: NEGATIVE
Ketones, ur: NEGATIVE
Specific Gravity, Urine: 1.014
Urobilinogen, UA: 0.2

## 2011-02-21 LAB — HEPATIC FUNCTION PANEL
Albumin: 2.7 — ABNORMAL LOW
Alkaline Phosphatase: 81
Bilirubin, Direct: 0.1
Indirect Bilirubin: 0.6
Total Bilirubin: 0.7

## 2011-02-21 LAB — POCT I-STAT 4, (NA,K, GLUC, HGB,HCT)
Glucose, Bld: 81
HCT: 34 — ABNORMAL LOW
Operator id: 274481
Sodium: 138

## 2011-02-21 LAB — CULTURE, BLOOD (ROUTINE X 2)
Culture: NO GROWTH
Culture: NO GROWTH

## 2011-02-21 LAB — URINE MICROSCOPIC-ADD ON

## 2011-03-14 LAB — AFB CULTURE WITH SMEAR (NOT AT ARMC): Acid Fast Smear: NONE SEEN

## 2011-06-06 DIAGNOSIS — B348 Other viral infections of unspecified site: Secondary | ICD-10-CM | POA: Insufficient documentation

## 2011-06-06 DIAGNOSIS — I10 Essential (primary) hypertension: Secondary | ICD-10-CM | POA: Insufficient documentation

## 2011-06-06 DIAGNOSIS — B182 Chronic viral hepatitis C: Secondary | ICD-10-CM | POA: Insufficient documentation

## 2011-06-06 DIAGNOSIS — Z21 Asymptomatic human immunodeficiency virus [HIV] infection status: Secondary | ICD-10-CM | POA: Insufficient documentation

## 2011-06-06 DIAGNOSIS — Z940 Kidney transplant status: Secondary | ICD-10-CM | POA: Insufficient documentation

## 2011-06-06 DIAGNOSIS — Z87898 Personal history of other specified conditions: Secondary | ICD-10-CM | POA: Insufficient documentation

## 2011-09-15 ENCOUNTER — Other Ambulatory Visit: Payer: Self-pay | Admitting: Internal Medicine

## 2011-09-15 ENCOUNTER — Other Ambulatory Visit: Payer: Self-pay | Admitting: Infectious Disease

## 2011-09-15 DIAGNOSIS — B20 Human immunodeficiency virus [HIV] disease: Secondary | ICD-10-CM

## 2011-09-15 MED ORDER — EFAVIRENZ 600 MG PO TABS: 600.0000 mg | ORAL_TABLET | Freq: Every day | ORAL | Status: DC

## 2011-09-15 MED ORDER — RALTEGRAVIR POTASSIUM 400 MG PO TABS: 400.0000 mg | ORAL_TABLET | Freq: Two times a day (BID) | ORAL | Status: DC

## 2011-10-25 ENCOUNTER — Telehealth: Payer: Self-pay | Admitting: *Deleted

## 2011-10-25 NOTE — Telephone Encounter (Signed)
I called & spoke to him about getting another lab & md appt made. He agreed. Transferred to front to make them

## 2011-11-08 ENCOUNTER — Other Ambulatory Visit: Payer: Medicare Other

## 2011-11-08 DIAGNOSIS — Z79899 Other long term (current) drug therapy: Secondary | ICD-10-CM

## 2011-11-08 DIAGNOSIS — B20 Human immunodeficiency virus [HIV] disease: Secondary | ICD-10-CM

## 2011-11-08 DIAGNOSIS — Z113 Encounter for screening for infections with a predominantly sexual mode of transmission: Secondary | ICD-10-CM

## 2011-11-08 LAB — COMPLETE METABOLIC PANEL WITH GFR
ALT: 22 U/L (ref 0–53)
CO2: 26 mEq/L (ref 19–32)
Creat: 1.64 mg/dL — ABNORMAL HIGH (ref 0.50–1.35)
GFR, Est African American: 55 mL/min — ABNORMAL LOW
GFR, Est Non African American: 47 mL/min — ABNORMAL LOW
Total Bilirubin: 0.5 mg/dL (ref 0.3–1.2)

## 2011-11-08 LAB — LIPID PANEL
Cholesterol: 167 mg/dL (ref 0–200)
HDL: 70 mg/dL (ref >39)
Triglycerides: 142 mg/dL (ref <150)

## 2011-11-08 LAB — CBC
MCH: 38.6 pg — ABNORMAL HIGH (ref 26.0–34.0)
MCV: 109.7 fL — ABNORMAL HIGH (ref 78.0–100.0)
Platelets: 170 10*3/uL (ref 150–400)
RBC: 3.81 MIL/uL — ABNORMAL LOW (ref 4.22–5.81)

## 2011-11-22 ENCOUNTER — Ambulatory Visit (INDEPENDENT_AMBULATORY_CARE_PROVIDER_SITE_OTHER): Payer: Medicare Other | Admitting: Internal Medicine

## 2011-11-22 ENCOUNTER — Encounter: Payer: Self-pay | Admitting: Internal Medicine

## 2011-11-22 VITALS — BP 118/68 | HR 85 | Temp 98.2°F | Ht 68.0 in | Wt 147.5 lb

## 2011-11-22 DIAGNOSIS — B20 Human immunodeficiency virus [HIV] disease: Secondary | ICD-10-CM

## 2011-11-22 DIAGNOSIS — Z79899 Other long term (current) drug therapy: Secondary | ICD-10-CM

## 2011-11-22 NOTE — Progress Notes (Signed)
Patient ID: Luis Reed, male   DOB: 12/18/1960, 51 y.o.   MRN: [id number]     [facility] for Infectious Disease  Patient Active Problem List  Diagnosis  . HIV INFECTION  . HEPATITIS C  . SYPHILIS  . HYPERTENSION  . CEREBROVASCULAR ACCIDENT, ACUTE  . RENAL DISEASE, END STAGE  . HYDROCELE NOS  . SEIZURE DISORDER  . ALCOHOL ABUSE, HX OF  . HEPATITIS B, HX OF  . TOBACCO USE, QUIT  . KIDNEY TRANSPLANTATION  . PARATHYROIDECTOMY    Patient's Medications  New Prescriptions   No medications on file  Previous Medications   EFAVIRENZ (SUSTIVA) 600 MG TABLET    Take 1 tablet (600 mg total) by mouth daily.   LAMIVUDINE-ZIDOVUDINE (COMBIVIR) 150-300 MG PER TABLET    Take 1 tablet by mouth 2 (two) times daily.   RALTEGRAVIR (ISENTRESS) 400 MG TABLET    Take 1 tablet (400 mg total) by mouth 2 (two) times daily.   TACROLIMUS (PROGRAF) 1 MG CAPSULE    Take 1 mg by mouth 2 (two) times daily. Take 2 tabs by mouth twice a day   TACROLIMUS (PROGRAF) 5 MG CAPSULE    Take 5 mg by mouth 2 (two) times daily.    Modified Medications   No medications on file  Discontinued Medications   LAMIVUDINE-ZIDOVUDINE (COMBIVIR) 150-300 MG PER TABLET    TAKE 1 TABLET BY MOUTH TWICE A DAY   PREDNISONE (DELTASONE) 1 MG TABLET    Take 1 mg by mouth daily. Take 3 tablets by mouth every Monday, Wednesday and Friday.   PREDNISONE (DELTASONE) 5 MG TABLET    Take 5 mg by mouth. One by mouth every mon, wed, fri     Subjective: Luis Reed is in for his routine visit. As usual he never misses a single dose of his HIV medications. He is doing well without any complaints.  Objective: Temp: 98.2 F (36.8 C) (07/16 1015) Temp src: Oral (07/16 1015) BP: 118/68 mmHg (07/16 1015) Pulse Rate: 85  (07/16 1015)  General: He is in good spirits Skin: No rash Lungs: Clear Cor: Regular S1 and S2 no murmurs  Lab Results HIV 1 RNA Quant (copies/mL)  Date Value  11/08/2011 <20   01/30/2011 <20   08/03/2010 <20      CD4  T Cell Abs (cmm)  Date Value  11/08/2011 760   01/29/2011 570   08/03/2010 880      Assessment: HIs HIV infection remains under excellent control.  Plan: 1. Continue Combivir, Isentress and Sustiva 2. Followup after lab work in 6 months 3. Encouraged to establish primary care   Cliffton Asters, MD [facility] for Infectious Disease [facility] [phone] pager   [phone] cell 11/22/2011, 10:41 AM

## 2011-12-11 ENCOUNTER — Emergency Department (HOSPITAL_COMMUNITY)
Admission: EM | Admit: 2011-12-11 | Discharge: 2011-12-11 | Disposition: A | Discharge status: Home / Self Care | Payer: Medicare Other | Attending: Emergency Medicine | Admitting: Emergency Medicine

## 2011-12-11 ENCOUNTER — Encounter (HOSPITAL_COMMUNITY): Payer: Self-pay | Admitting: Emergency Medicine

## 2011-12-11 DIAGNOSIS — Z940 Kidney transplant status: Secondary | ICD-10-CM | POA: Insufficient documentation

## 2011-12-11 DIAGNOSIS — Y998 Other external cause status: Secondary | ICD-10-CM | POA: Insufficient documentation

## 2011-12-11 DIAGNOSIS — IMO0002 Reserved for concepts with insufficient information to code with codable children: Secondary | ICD-10-CM | POA: Insufficient documentation

## 2011-12-11 DIAGNOSIS — Z87891 Personal history of nicotine dependence: Secondary | ICD-10-CM | POA: Insufficient documentation

## 2011-12-11 DIAGNOSIS — S39011A Strain of muscle, fascia and tendon of abdomen, initial encounter: Secondary | ICD-10-CM

## 2011-12-11 DIAGNOSIS — Y9389 Activity, other specified: Secondary | ICD-10-CM | POA: Insufficient documentation

## 2011-12-11 DIAGNOSIS — X500XXA Overexertion from strenuous movement or load, initial encounter: Secondary | ICD-10-CM | POA: Insufficient documentation

## 2011-12-11 DIAGNOSIS — Z21 Asymptomatic human immunodeficiency virus [HIV] infection status: Secondary | ICD-10-CM | POA: Insufficient documentation

## 2011-12-11 HISTORY — DX: Disorder of kidney and ureter, unspecified: N28.9

## 2011-12-11 LAB — URINALYSIS, ROUTINE W REFLEX MICROSCOPIC
Glucose, UA: NEGATIVE mg/dL
Hgb urine dipstick: NEGATIVE
Ketones, ur: NEGATIVE mg/dL
Protein, ur: NEGATIVE mg/dL
pH: 6 (ref 5.0–8.0)

## 2011-12-11 LAB — CBC WITH DIFFERENTIAL/PLATELET
Eosinophils Relative: 1 % (ref 0–5)
HCT: 40.3 % (ref 39.0–52.0)
Hemoglobin: 14 g/dL (ref 13.0–17.0)
Lymphocytes Relative: 56 % — ABNORMAL HIGH (ref 12–46)
Lymphs Abs: 2.6 10*3/uL (ref 0.7–4.0)
MCV: 111.3 fL — ABNORMAL HIGH (ref 78.0–100.0)
Monocytes Absolute: 0.6 10*3/uL (ref 0.1–1.0)
RBC: 3.62 MIL/uL — ABNORMAL LOW (ref 4.22–5.81)
WBC: 4.7 10*3/uL (ref 4.0–10.5)

## 2011-12-11 LAB — BASIC METABOLIC PANEL
CO2: 26 mEq/L (ref 19–32)
Calcium: 7 mg/dL — ABNORMAL LOW (ref 8.4–10.5)
Creatinine, Ser: 1.6 mg/dL — ABNORMAL HIGH (ref 0.50–1.35)
Glucose, Bld: 69 mg/dL — ABNORMAL LOW (ref 70–99)

## 2011-12-11 NOTE — ED Provider Notes (Signed)
History     CSN: [id number]  Arrival date & time 12/11/11  1214   First MD Initiated Contact with Patient 12/11/11 1533      Chief Complaint  Patient presents with  . Abdominal Pain    Right lower    (Consider location/radiation/quality/duration/timing/severity/associated sxs/prior treatment) Patient is a 51 y.o. male presenting with abdominal pain.  Abdominal Pain The primary symptoms of the illness do not include abdominal pain, fatigue, nausea, vomiting, diarrhea or dysuria.  Symptoms associated with the illness do not include constipation, urgency or frequency.    52 year old HIV-positive status post renal transplant male in no acute distress complaining of odd sensation to right groin intermittently for the last 24 hours. Patient denies all pain, change in bowel or bladder function, fever. Patient cannot describe the sensation a light needle pinch. The sensation lasts for 5 seconds and comes roughly every 45 min.  He cannot identify any alleviating or exacerbating factors. Pt was lifting 80 lb bags of concrete 24 hours before the start of this odd sensation.   Past Medical History  Diagnosis Date  . Renal disorder renal failure    Past Surgical History  Procedure Date  . Nephrectomy transplanted organ     No family history on file.  History  Substance Use Topics  . Smoking status: Former Smoker -- 0.3 packs/day for 20 years  . Smokeless tobacco: Never Used  . Alcohol Use: No      Review of Systems  Constitutional: Negative for fatigue.  Gastrointestinal: Negative for nausea, vomiting, abdominal pain, diarrhea, constipation and rectal pain.  Genitourinary: Negative for dysuria, urgency, frequency, discharge, penile pain and testicular pain.  All other systems reviewed and are negative.    Allergies  Hydrocodone  Home Medications   Current Outpatient Rx  Name Route Sig Dispense Refill  . EFAVIRENZ 600 MG PO TABS Oral Take 1 tablet (600 mg total) by mouth  daily. 30 tablet 8  . LAMIVUDINE-ZIDOVUDINE 150-300 MG PO TABS Oral Take 1 tablet by mouth 2 (two) times daily. 60 tablet 8  . RALTEGRAVIR POTASSIUM 400 MG PO TABS Oral Take 1 tablet (400 mg total) by mouth 2 (two) times daily. 60 tablet 8  . TACROLIMUS 1 MG PO CAPS Oral Take 1 mg by mouth 2 (two) times daily. Take 2 tabs by mouth twice a day    . TACROLIMUS 5 MG PO CAPS Oral Take 5 mg by mouth 2 (two) times daily.        BP 108/75  Pulse 65  Temp 98.2 F (36.8 C) (Oral)  Resp 16  SpO2 97%  Physical Exam  Vitals reviewed. Constitutional: He is oriented to person, place, and time. He appears well-developed and well-nourished. No distress.  HENT:  Head: Normocephalic and atraumatic.  Eyes: Conjunctivae and EOM are normal. Pupils are equal, round, and reactive to light.  Cardiovascular: Normal rate.   Pulmonary/Chest: Effort normal.  Abdominal: Soft. Bowel sounds are normal. He exhibits no distension. There is no tenderness. There is no rebound and no guarding. Hernia confirmed negative in the right inguinal area and confirmed negative in the left inguinal area.  Genitourinary: Testes normal and penis normal.    Right testis shows no mass, no swelling and no tenderness. Right testis is descended. Cremasteric reflex is not absent on the right side. Left testis shows no mass, no swelling and no tenderness. Left testis is descended. Cremasteric reflex is not absent on the left side. Uncircumcised. No penile tenderness. No discharge  found.       No tenderness to palpation, no erythema, no hernias, no LAD to inguinal chains.  Musculoskeletal: Normal range of motion.  Lymphadenopathy:       Right: No inguinal adenopathy present.       Left: No inguinal adenopathy present.  Neurological: He is alert and oriented to person, place, and time.  Skin: Skin is warm and dry. He is not diaphoretic. No erythema.  Psychiatric: He has a normal mood and affect.    ED Course  Procedures (including  critical care time)  Labs Reviewed  CBC WITH DIFFERENTIAL - Abnormal; Notable for the following:    RBC 3.62 (*)     MCV 111.3 (*)     MCH 38.7 (*)     Neutrophils Relative 30 (*)     Neutro Abs 1.4 (*)     Lymphocytes Relative 56 (*)     Monocytes Relative 13 (*)     All other components within normal limits  BASIC METABOLIC PANEL - Abnormal; Notable for the following:    Glucose, Bld 69 (*)     Creatinine, Ser 1.60 (*)     Calcium 7.0 (*)     GFR calc non Af Amer 48 (*)     GFR calc Af Amer 56 (*)     All other components within normal limits  URINALYSIS, ROUTINE W REFLEX MICROSCOPIC   No results found.   1. Ilio-inguinal strain       MDM  No concerns for acute process, abdominal exam benign, VSS, physical unremarkable. I suspect muscle strain. Shared visit with attending Dr. Ranae Palms who agrees with stability to d/c with close primary care follow up.  Pt verbalized understanding and agrees with care plan. Outpatient follow-up and return precautions given.          Wynetta Emery, PA-C 12/11/11 1734

## 2011-12-11 NOTE — ED Notes (Signed)
Pt d/c home NAD. Pt voiced understanding of d/c instructions and follow up care. Pt able to ambulate with quick steady gait.

## 2011-12-11 NOTE — ED Notes (Signed)
Patient denies problems voiding, He advises that he had a right kidney transplant in 2007.

## 2011-12-11 NOTE — ED Notes (Signed)
Resting quietly voices no complaints  

## 2011-12-11 NOTE — ED Notes (Signed)
Pt c/o right lower quadrant abdominal pain. Pt reports had kidney transplant in 2007. Pt denies urinary problems at present. Reports "pain is not that bad."

## 2011-12-11 NOTE — ED Notes (Signed)
Patient advises that this discomfort is less than a 1 on the pain scale and it is intermittent and last just moments.He states" I would not even describe it as pain I just know it is there."

## 2011-12-14 NOTE — ED Provider Notes (Signed)
Medical screening examination/treatment/procedure(s) were conducted as a shared visit with non-physician practitioner(s) and myself.  I personally evaluated the patient during the encounter   Loren Racer, MD 12/14/11 2248

## 2012-06-07 ENCOUNTER — Other Ambulatory Visit (INDEPENDENT_AMBULATORY_CARE_PROVIDER_SITE_OTHER): Payer: Medicare Other

## 2012-06-07 DIAGNOSIS — Z79899 Other long term (current) drug therapy: Secondary | ICD-10-CM

## 2012-06-07 DIAGNOSIS — B20 Human immunodeficiency virus [HIV] disease: Secondary | ICD-10-CM

## 2012-06-07 LAB — CBC
Hemoglobin: 14.4 g/dL (ref 13.0–17.0)
MCH: 38.9 pg — ABNORMAL HIGH (ref 26.0–34.0)
Platelets: 196 10*3/uL (ref 150–400)
RBC: 3.7 MIL/uL — ABNORMAL LOW (ref 4.22–5.81)
WBC: 5.6 10*3/uL (ref 4.0–10.5)

## 2012-06-07 LAB — LIPID PANEL
Cholesterol: 161 mg/dL (ref 0–200)
Triglycerides: 79 mg/dL (ref <150)
VLDL: 16 mg/dL (ref 0–40)

## 2012-06-07 LAB — COMPREHENSIVE METABOLIC PANEL
ALT: 19 U/L (ref 0–53)
Albumin: 4.2 g/dL (ref 3.5–5.2)
CO2: 28 mEq/L (ref 19–32)
Calcium: 7.7 mg/dL — ABNORMAL LOW (ref 8.4–10.5)
Chloride: 105 mEq/L (ref 96–112)
Glucose, Bld: 107 mg/dL — ABNORMAL HIGH (ref 70–99)
Potassium: 4.6 mEq/L (ref 3.5–5.3)
Sodium: 141 mEq/L (ref 135–145)
Total Protein: 7.2 g/dL (ref 6.0–8.3)

## 2012-06-08 LAB — T-HELPER CELL (CD4) - (RCID CLINIC ONLY)
CD4 % Helper T Cell: 31 % — ABNORMAL LOW (ref 33–55)
CD4 T Cell Abs: 490 uL (ref 400–2700)

## 2012-06-08 LAB — HIV-1 RNA QUANT-NO REFLEX-BLD: HIV 1 RNA Quant: 51 copies/mL — ABNORMAL HIGH (ref <20)

## 2012-06-26 ENCOUNTER — Ambulatory Visit: Payer: Medicare Other | Admitting: Internal Medicine

## 2012-06-26 ENCOUNTER — Telehealth: Payer: Self-pay | Admitting: *Deleted

## 2012-06-26 NOTE — Telephone Encounter (Signed)
Made new appt. 

## 2012-07-16 ENCOUNTER — Ambulatory Visit (INDEPENDENT_AMBULATORY_CARE_PROVIDER_SITE_OTHER): Payer: Medicare Other | Admitting: Internal Medicine

## 2012-07-16 ENCOUNTER — Encounter: Payer: Self-pay | Admitting: Internal Medicine

## 2012-07-16 VITALS — BP 127/82 | HR 84 | Temp 98.2°F | Ht 68.0 in | Wt 151.2 lb

## 2012-07-16 DIAGNOSIS — B1710 Acute hepatitis C without hepatic coma: Secondary | ICD-10-CM

## 2012-07-16 NOTE — Progress Notes (Signed)
Patient ID: Luis Reed, male   DOB: [birth year], 52 y.o.   MRN: [id number]          [facility] for Infectious Disease  Patient Active Problem List  Diagnosis  . HIV INFECTION  . HEPATITIS C  . SYPHILIS  . HYPERTENSION  . CEREBROVASCULAR ACCIDENT, ACUTE  . RENAL DISEASE, END STAGE  . HYDROCELE NOS  . SEIZURE DISORDER  . ALCOHOL ABUSE, HX OF  . HEPATITIS B, HX OF  . TOBACCO USE, QUIT  . KIDNEY TRANSPLANTATION  . PARATHYROIDECTOMY    Patient's Medications  New Prescriptions   No medications on file  Previous Medications   EFAVIRENZ (SUSTIVA) 600 MG TABLET    Take 1 tablet (600 mg total) by mouth daily.   LAMIVUDINE-ZIDOVUDINE (COMBIVIR) 150-300 MG PER TABLET    Take 1 tablet by mouth 2 (two) times daily.   RALTEGRAVIR (ISENTRESS) 400 MG TABLET    Take 1 tablet (400 mg total) by mouth 2 (two) times daily.   TACROLIMUS (PROGRAF) 1 MG CAPSULE    Take 1 mg by mouth daily. Take 2 tabs by mouth twice a day   TACROLIMUS (PROGRAF) 5 MG CAPSULE    Take 5 mg by mouth every evening.   Modified Medications   No medications on file  Discontinued Medications   No medications on file    Subjective: Luis Reed is in for his routine visit. As usual he never misses a single dose of his medications. He is feeling well without any difficulties. Years ago he did receive therapy for hepatitis C but he has not had any followup for that since he completed therapy at [facility]. He has a hard he received his influenza vaccine this season.  Objective: Temp: 98.2 F (36.8 C) (03/10 1444) Temp src: Oral (03/10 1444) BP: 127/82 mmHg (03/10 1444) Pulse Rate: 84 (03/10 1444)  General: he is in good spirits as usual Skin: no rash Oral: No thrush or other oral pharyngeal lesion Lungs: clear Cor: regular S1 and S2 no murmurs Abdomen: nontender  Lab Results HIV 1 RNA Quant (copies/mL)  Date Value  06/07/2012 51*  11/08/2011 <20   01/30/2011 <20      CD4 T Cell Abs (cmm)  Date  Value  06/07/2012 490   11/08/2011 760   01/29/2011 570      Assessment: He's had a slight elevation of his viral load which is probably insignificant. I will continue his current regimen. His adherence is excellent.  I will recheck hepatitis C viral load.  His blood pressure and lipids are at goal.  Plan: 1. Continue current medications 2. Check hepatitis C viral load 3. Followup after lab work in 6 months   Cliffton Asters, MD [facility] for Infectious Disease [facility] [phone] pager   [phone] cell 07/16/2012, 2:56 PM

## 2012-07-17 LAB — HEPATITIS C RNA QUANTITATIVE
HCV Quantitative Log: 6.84 {Log} — ABNORMAL HIGH (ref <1.18)
HCV Quantitative: 6948252 IU/mL — ABNORMAL HIGH (ref <15)

## 2012-07-24 NOTE — Addendum Note (Signed)
Addended by: Jennet Maduro D on: 07/24/2012 02:58 PM   Modules accepted: Orders

## 2012-08-08 ENCOUNTER — Encounter: Payer: Self-pay | Admitting: *Deleted

## 2012-08-08 NOTE — Progress Notes (Signed)
Patient ID: Luis Reed, male   DOB: April 28, 1961, 52 y.o.   MRN: [id number]  Pt has a referral for Gastroenterologist. Appt was made for 08/22/12 at 1:30pm with [facility] [phone]). They have notified patient of upcoming appointment. Tacey Heap RN

## 2012-08-27 ENCOUNTER — Other Ambulatory Visit: Payer: Self-pay | Admitting: Internal Medicine

## 2012-08-27 DIAGNOSIS — B1920 Unspecified viral hepatitis C without hepatic coma: Secondary | ICD-10-CM

## 2012-08-31 ENCOUNTER — Ambulatory Visit
Admission: RE | Admit: 2012-08-31 | Discharge: 2012-08-31 | Disposition: A | Discharge status: Home / Self Care | Payer: Medicare Other | Source: Ambulatory Visit | Attending: Internal Medicine | Admitting: Internal Medicine

## 2012-08-31 ENCOUNTER — Other Ambulatory Visit: Payer: Self-pay | Admitting: *Deleted

## 2012-08-31 DIAGNOSIS — B20 Human immunodeficiency virus [HIV] disease: Secondary | ICD-10-CM

## 2012-08-31 DIAGNOSIS — B1920 Unspecified viral hepatitis C without hepatic coma: Secondary | ICD-10-CM

## 2012-08-31 MED ORDER — EFAVIRENZ 600 MG PO TABS: 600.0000 mg | ORAL_TABLET | Freq: Every day | ORAL | Status: DC

## 2012-08-31 MED ORDER — RALTEGRAVIR POTASSIUM 400 MG PO TABS: 400.0000 mg | ORAL_TABLET | Freq: Two times a day (BID) | ORAL | Status: DC

## 2012-08-31 MED ORDER — LAMIVUDINE-ZIDOVUDINE 150-300 MG PO TABS: 1.0000 | ORAL_TABLET | Freq: Two times a day (BID) | ORAL | Status: DC

## 2012-12-27 ENCOUNTER — Other Ambulatory Visit: Payer: Medicare Other

## 2012-12-27 DIAGNOSIS — B20 Human immunodeficiency virus [HIV] disease: Secondary | ICD-10-CM

## 2012-12-27 LAB — COMPREHENSIVE METABOLIC PANEL
ALT: 23 U/L (ref 0–53)
AST: 23 U/L (ref 0–37)
Calcium: 7.8 mg/dL — ABNORMAL LOW (ref 8.4–10.5)
Chloride: 106 mEq/L (ref 96–112)
Creat: 1.45 mg/dL — ABNORMAL HIGH (ref 0.50–1.35)
Potassium: 4.1 mEq/L (ref 3.5–5.3)

## 2012-12-27 LAB — CBC WITH DIFFERENTIAL/PLATELET
Basophils Relative: 0 % (ref 0–1)
Hemoglobin: 13.1 g/dL (ref 13.0–17.0)
Lymphs Abs: 1.8 10*3/uL (ref 0.7–4.0)
Monocytes Relative: 12 % (ref 3–12)
Neutro Abs: 1.5 10*3/uL — ABNORMAL LOW (ref 1.7–7.7)
Neutrophils Relative %: 39 % — ABNORMAL LOW (ref 43–77)
RBC: 3.36 MIL/uL — ABNORMAL LOW (ref 4.22–5.81)
WBC: 3.8 10*3/uL — ABNORMAL LOW (ref 4.0–10.5)

## 2012-12-27 NOTE — Addendum Note (Signed)
Addended bySteva Colder on: 12/27/2012 09:52 AM   Modules accepted: Orders

## 2012-12-28 LAB — HIV-1 RNA QUANT-NO REFLEX-BLD
HIV 1 RNA Quant: <20 copies/mL (ref <20)
HIV-1 RNA Quant, Log: <1.3 {Log} (ref <1.30)

## 2013-01-17 ENCOUNTER — Ambulatory Visit (INDEPENDENT_AMBULATORY_CARE_PROVIDER_SITE_OTHER): Payer: Medicare Other | Admitting: Internal Medicine

## 2013-01-17 VITALS — Temp 98.1°F | Ht 68.0 in | Wt 152.2 lb

## 2013-01-17 DIAGNOSIS — B1710 Acute hepatitis C without hepatic coma: Secondary | ICD-10-CM

## 2013-01-17 DIAGNOSIS — Z79899 Other long term (current) drug therapy: Secondary | ICD-10-CM

## 2013-01-17 DIAGNOSIS — B20 Human immunodeficiency virus [HIV] disease: Secondary | ICD-10-CM

## 2013-01-17 DIAGNOSIS — Z113 Encounter for screening for infections with a predominantly sexual mode of transmission: Secondary | ICD-10-CM

## 2013-01-17 DIAGNOSIS — Z23 Encounter for immunization: Secondary | ICD-10-CM

## 2013-01-17 NOTE — Progress Notes (Signed)
Patient ID: Luis Reed, male   DOB: 1960/11/23, 52 y.o.   MRN: [id number]          [facility] for Infectious Disease  Patient Active Problem List   Diagnosis Date Noted  . KIDNEY TRANSPLANTATION 07/11/2007  . HEPATITIS C 03/15/2006  . SYPHILIS 03/15/2006  . HYPERTENSION 03/15/2006  . RENAL DISEASE, END STAGE 03/15/2006  . HYDROCELE NOS 03/15/2006  . SEIZURE DISORDER 03/15/2006  . ALCOHOL ABUSE, HX OF 03/15/2006  . HEPATITIS B, HX OF 03/15/2006  . TOBACCO USE, QUIT 03/15/2006  . PARATHYROIDECTOMY 08/08/2002  . CEREBROVASCULAR ACCIDENT, ACUTE 10/08/1999  . HIV INFECTION 05/10/1999    Patient's Medications  New Prescriptions   No medications on file  Previous Medications   EFAVIRENZ (SUSTIVA) 600 MG TABLET    Take 1 tablet (600 mg total) by mouth daily.   LAMIVUDINE-ZIDOVUDINE (COMBIVIR) 150-300 MG PER TABLET    Take 1 tablet by mouth 2 (two) times daily.   PREDNISONE (STERAPRED UNI-PAK) 5 MG TABS TABLET    Take 5 mg by mouth.   RALTEGRAVIR (ISENTRESS) 400 MG TABLET    Take 1 tablet (400 mg total) by mouth 2 (two) times daily.   TACROLIMUS (PROGRAF) 1 MG CAPSULE    Take 2 mg by mouth 2 (two) times daily.    TACROLIMUS (PROGRAF) 5 MG CAPSULE    Take 5 mg by mouth 2 (two) times daily.   Modified Medications   No medications on file  Discontinued Medications   No medications on file    Subjective: Luis Reed is in for his routine followup visit. As usual he never misses a single dose of his HIV medications. He is doing well without any complaints.  Review of Systems: Pertinent items are noted in HPI.  Past Medical History  Diagnosis Date  . Renal disorder renal failure    History  Substance Use Topics  . Smoking status: Former Smoker -- 0.30 packs/day for 20 years  . Smokeless tobacco: Never Used  . Alcohol Use: No    No family history on file.  Allergies  Allergen Reactions  . Hydrocodone     Unknown    Objective: Temp: 98.1 F (36.7 C) (09/11  1052) Temp src: Oral (09/11 1052)  General: He is in good spirits Oral: No oropharyngeal lesions Skin: No rash Lungs: Clear Cor:  Regular S1 and S2 no murmurs Mood and affect: Normal  Lab Results HIV 1 RNA Quant (copies/mL)  Date Value  12/27/2012 <20   06/07/2012 51*  11/08/2011 <20      CD4 T Cell Abs (/uL)  Date Value  12/27/2012 650   06/07/2012 490   11/08/2011 760    Lab Results  Component Value Date   WBC 3.8* 12/27/2012   HGB 13.1 12/27/2012   HCT 37.1* 12/27/2012   MCV 110.4* 12/27/2012   PLT 176 12/27/2012   BMET    Component Value Date/Time   NA 137 12/27/2012 0951   K 4.1 12/27/2012 0951   CL 106 12/27/2012 0951   CO2 22 12/27/2012 0951   GLUCOSE 88 12/27/2012 0951   BUN 22 12/27/2012 0951   CREATININE 1.45* 12/27/2012 0951   CREATININE 1.60* 12/11/2011 1434   CALCIUM 7.8* 12/27/2012 0951   CALCIUM 6.9* 02/01/2011 0610   GFRNONAA 48* 12/11/2011 1434   GFRAA 56* 12/11/2011 1434   Lab Results  Component Value Date   ALT 23 12/27/2012   AST 23 12/27/2012   ALKPHOS 58 12/27/2012   BILITOT  0.5 12/27/2012     Assessment: His HIV infection remains under excellent control. He has mild, stable renal insufficiency and does not require a change in dosing of his medications.  Plan: 1. Continue current antiretroviral regimen 2. Followup after lab work in 6 months 3. He has already received his influenza vaccine   Cliffton Asters, MD [facility] for Infectious Disease [facility] [phone] pager   [phone] cell 01/17/2013, 11:13 AM

## 2013-04-12 ENCOUNTER — Emergency Department (HOSPITAL_COMMUNITY)
Admission: EM | Admit: 2013-04-12 | Discharge: 2013-04-12 | Disposition: A | Discharge status: Home / Self Care | Payer: Medicare Other | Attending: Emergency Medicine | Admitting: Emergency Medicine

## 2013-04-12 ENCOUNTER — Emergency Department (HOSPITAL_COMMUNITY): Payer: Medicare Other

## 2013-04-12 ENCOUNTER — Encounter (HOSPITAL_COMMUNITY): Payer: Self-pay | Admitting: Emergency Medicine

## 2013-04-12 DIAGNOSIS — S60011A Contusion of right thumb without damage to nail, initial encounter: Secondary | ICD-10-CM

## 2013-04-12 DIAGNOSIS — IMO0002 Reserved for concepts with insufficient information to code with codable children: Secondary | ICD-10-CM | POA: Insufficient documentation

## 2013-04-12 DIAGNOSIS — Y939 Activity, unspecified: Secondary | ICD-10-CM | POA: Insufficient documentation

## 2013-04-12 DIAGNOSIS — Z87448 Personal history of other diseases of urinary system: Secondary | ICD-10-CM | POA: Insufficient documentation

## 2013-04-12 DIAGNOSIS — Z940 Kidney transplant status: Secondary | ICD-10-CM | POA: Insufficient documentation

## 2013-04-12 DIAGNOSIS — S6000XA Contusion of unspecified finger without damage to nail, initial encounter: Secondary | ICD-10-CM | POA: Insufficient documentation

## 2013-04-12 DIAGNOSIS — Z87891 Personal history of nicotine dependence: Secondary | ICD-10-CM | POA: Insufficient documentation

## 2013-04-12 DIAGNOSIS — Y929 Unspecified place or not applicable: Secondary | ICD-10-CM | POA: Insufficient documentation

## 2013-04-12 DIAGNOSIS — Z79899 Other long term (current) drug therapy: Secondary | ICD-10-CM | POA: Insufficient documentation

## 2013-04-12 MED ORDER — ACETAMINOPHEN 325 MG PO TABS: 325.0000 mg | ORAL_TABLET | Freq: Four times a day (QID) | ORAL | Status: DC | PRN

## 2013-04-12 NOTE — ED Provider Notes (Signed)
CSN: [id number]     Arrival date & time 04/12/13  1926 History   First MD Initiated Contact with Patient 04/12/13 1958      This chart was scribed for non-physician practitioner, Francee Piccolo PA-C working with Layla Maw Ward, DO by Arlan Organ, ED Scribe. This patient was seen in room TR05C/TR05C and the patient's care was started at 9:26 PM.   Chief Complaint  Patient presents with  . Hand Pain    The history is provided by the patient. No language interpreter was used.   HPI Comments: Luis Reed is a 52 y.o. male who presents to the Emergency Department complaining of gradual onset, unchanged, constant right thumb pain that first started a few days ago. Pt states he hit his thumb with a hammer last week. Pt says the pain stays just below the right thumb nailbed, and does not radiate. He describes the pain as "throbbing", and rates the pain as 2/10. Pt states he has not tried any OTC medications for the discomfort, but has tried icing the area with mild relief. He denies any previous injury to the right hand. He reports a h/o of a kidney transplant. Pt is right handed. He denies fever.  Past Medical History  Diagnosis Date  . Renal disorder renal failure   Past Surgical History  Procedure Laterality Date  . Nephrectomy transplanted organ     History reviewed. No pertinent family history. History  Substance Use Topics  . Smoking status: Former Smoker -- 0.30 packs/day for 20 years  . Smokeless tobacco: Never Used  . Alcohol Use: No    Review of Systems  Constitutional: Negative for fever.  Musculoskeletal: Positive for arthralgias (right thumb pain).    Allergies  Hydrocodone  Home Medications   Current Outpatient Rx  Name  Route  Sig  Dispense  Refill  . efavirenz (SUSTIVA) 600 MG tablet   Oral   Take 600 mg by mouth at bedtime.         Marland Kitchen lamiVUDine-zidovudine (COMBIVIR) 150-300 MG per tablet   Oral   Take 1 tablet by mouth 2 (two) times daily.   60  tablet   8   . predniSONE (DELTASONE) 5 MG tablet   Oral   Take 5 mg by mouth 3 (three) times daily. Take on Monday, Wednesday and Friday nights         . raltegravir (ISENTRESS) 400 MG tablet   Oral   Take 1 tablet (400 mg total) by mouth 2 (two) times daily.   60 tablet   8   . tacrolimus (PROGRAF) 1 MG capsule   Oral   Take 1 mg by mouth 2 (two) times daily. Take with 5 mg tablet for a 6 mg dose         . tacrolimus (PROGRAF) 5 MG capsule   Oral   Take 5 mg by mouth 2 (two) times daily. Take with 1 mg tablet for a 6 mg dose         . zolpidem (AMBIEN) 10 MG tablet   Oral   Take 10 mg by mouth at bedtime as needed for sleep.          Marland Kitchen acetaminophen (TYLENOL) 325 MG tablet   Oral   Take 1 tablet (325 mg total) by mouth every 6 (six) hours as needed for mild pain or moderate pain (pain).   30 tablet   0    Triage Vitals: BP 130/80  Pulse 74  Temp(Src) 98.2 F (36.8 C) (Oral)  Resp 18  Ht 5\' 8"  (1.727 m)  Wt 150 lb (68.04 kg)  BMI 22.81 kg/m2  SpO2 100%  Physical Exam  Nursing note and vitals reviewed. Constitutional: He is oriented to person, place, and time. He appears well-developed and well-nourished.  HENT:  Head: Normocephalic and atraumatic.  Eyes: EOM are normal.  Neck: Normal range of motion.  Cardiovascular: Normal rate and intact distal pulses.   Pulmonary/Chest: Effort normal.  Musculoskeletal: Normal range of motion.       Right wrist: Normal.       Left wrist: Normal.       Left forearm: Normal.       Right hand: Normal. He exhibits normal range of motion, no tenderness, no bony tenderness, normal two-point discrimination, normal capillary refill, no deformity, no laceration and no swelling.       Hands: Right thumb nailbed intact  Neurological: He is alert and oriented to person, place, and time.  Skin: Skin is warm and dry.  Psychiatric: He has a normal mood and affect. His behavior is normal.    ED Course  Procedures (including  critical care time)  DIAGNOSTIC STUDIES: Oxygen Saturation is 100% on RA, Normal by my interpretation.    COORDINATION OF CARE: 9:31 PM- Will order X-Ray. Discussed treatment plan with pt at bedside and pt agreed to plan.     Labs Review Labs Reviewed - No data to display Imaging Review Dg Finger Thumb Right  04/12/2013   CLINICAL DATA:  Hit right thumb with hammer.  EXAM: RIGHT THUMB 2+V  COMPARISON:  None.  FINDINGS: There is no evidence of fracture or dislocation. There is no evidence of arthropathy or other focal bone abnormality. Soft tissues are unremarkable  IMPRESSION: Negative.   Electronically Signed   By: Signa Kell M.D.   On: 04/12/2013 20:47    EKG Interpretation   None       MDM   1. Contusion of thumb, right, initial encounter     Afebrile, NAD, non-toxic appearing, AAOx4. Neurovascularly intact. Normal sensation. Imaging shows no fracture. Directed pt to ice injury, take acetaminophen or ibuprofen for pain, and to elevate and rest the injury when possible. Advised PCP f/u. Return precautions discussed. Patient is agreeable to plan. Patient is stable at time of discharge.     I personally performed the services described in this documentation, which was scribed in my presence. The recorded information has been reviewed and is accurate.    Jeannetta Ellis, PA-C 04/12/13 2205

## 2013-04-12 NOTE — ED Provider Notes (Signed)
Medical screening examination/treatment/procedure(s) were performed by non-physician practitioner and as supervising physician I was immediately available for consultation/collaboration.  EKG Interpretation   None         Amaurie Schreckengost N Janeya Deyo, DO 04/12/13 2356 

## 2013-04-12 NOTE — ED Notes (Signed)
Pt states he his his right thumb with a hammer a couple days ago, and it feels like the pressure needs to be relieved in the digit.

## 2013-07-03 ENCOUNTER — Other Ambulatory Visit: Payer: Medicare Other

## 2013-07-03 DIAGNOSIS — B1710 Acute hepatitis C without hepatic coma: Secondary | ICD-10-CM

## 2013-07-03 DIAGNOSIS — Z79899 Other long term (current) drug therapy: Secondary | ICD-10-CM

## 2013-07-03 DIAGNOSIS — B20 Human immunodeficiency virus [HIV] disease: Secondary | ICD-10-CM

## 2013-07-03 DIAGNOSIS — Z113 Encounter for screening for infections with a predominantly sexual mode of transmission: Secondary | ICD-10-CM

## 2013-07-03 LAB — CBC
HCT: 43.3 % (ref 39.0–52.0)
Hemoglobin: 15.1 g/dL (ref 13.0–17.0)
MCH: 38.7 pg — AB (ref 26.0–34.0)
MCHC: 34.9 g/dL (ref 30.0–36.0)
MCV: 111 fL — ABNORMAL HIGH (ref 78.0–100.0)
PLATELETS: 172 10*3/uL (ref 150–400)
RBC: 3.9 MIL/uL — ABNORMAL LOW (ref 4.22–5.81)
RDW: 13.1 % (ref 11.5–15.5)
WBC: 4 10*3/uL (ref 4.0–10.5)

## 2013-07-03 LAB — COMPREHENSIVE METABOLIC PANEL
ALBUMIN: 4 g/dL (ref 3.5–5.2)
ALK PHOS: 70 U/L (ref 39–117)
ALT: 21 U/L (ref 0–53)
AST: 19 U/L (ref 0–37)
BUN: 16 mg/dL (ref 6–23)
CO2: 28 mEq/L (ref 19–32)
Calcium: 7.7 mg/dL — ABNORMAL LOW (ref 8.4–10.5)
Chloride: 103 mEq/L (ref 96–112)
Creat: 1.51 mg/dL — ABNORMAL HIGH (ref 0.50–1.35)
GLUCOSE: 90 mg/dL (ref 70–99)
POTASSIUM: 4.5 meq/L (ref 3.5–5.3)
SODIUM: 140 meq/L (ref 135–145)
TOTAL PROTEIN: 7.1 g/dL (ref 6.0–8.3)
Total Bilirubin: 0.5 mg/dL (ref 0.2–1.2)

## 2013-07-03 LAB — LIPID PANEL
CHOLESTEROL: 164 mg/dL (ref 0–200)
HDL: 94 mg/dL (ref >39)
LDL Cholesterol: 52 mg/dL (ref 0–99)
Total CHOL/HDL Ratio: 1.7 Ratio
Triglycerides: 88 mg/dL (ref <150)
VLDL: 18 mg/dL (ref 0–40)

## 2013-07-03 LAB — RPR

## 2013-07-04 LAB — T-HELPER CELL (CD4) - (RCID CLINIC ONLY)
CD4 % Helper T Cell: 35 % (ref 33–55)
CD4 T Cell Abs: 680 /uL (ref 400–2700)

## 2013-07-05 LAB — HEPATITIS C RNA QUANTITATIVE
HCV QUANT LOG: 6.56 {Log} — AB (ref <1.18)
HCV Quantitative: 3656540 IU/mL — ABNORMAL HIGH (ref <15)

## 2013-07-05 LAB — HIV-1 RNA QUANT-NO REFLEX-BLD: HIV-1 RNA Quant, Log: <1.3 {Log} (ref <1.30)

## 2013-07-16 ENCOUNTER — Ambulatory Visit (INDEPENDENT_AMBULATORY_CARE_PROVIDER_SITE_OTHER): Payer: Medicare Other | Admitting: Internal Medicine

## 2013-07-16 ENCOUNTER — Encounter: Payer: Self-pay | Admitting: Internal Medicine

## 2013-07-16 VITALS — BP 115/68 | HR 71 | Temp 97.8°F | Ht 68.0 in | Wt 147.0 lb

## 2013-07-16 DIAGNOSIS — B20 Human immunodeficiency virus [HIV] disease: Secondary | ICD-10-CM

## 2013-07-16 NOTE — Progress Notes (Signed)
Patient ID: Luis Reed, male   DOB: 1960-08-12, 53 y.o.   MRN: [id number] @LOGODEPT         Patient Active Problem List   Diagnosis Date Noted  . KIDNEY TRANSPLANTATION 07/11/2007  . HEPATITIS C 03/15/2006  . SYPHILIS 03/15/2006  . HYPERTENSION 03/15/2006  . RENAL DISEASE, END STAGE 03/15/2006  . HYDROCELE NOS 03/15/2006  . SEIZURE DISORDER 03/15/2006  . ALCOHOL ABUSE, HX OF 03/15/2006  . HEPATITIS B, HX OF 03/15/2006  . TOBACCO USE, QUIT 03/15/2006  . PARATHYROIDECTOMY 08/08/2002  . CEREBROVASCULAR ACCIDENT, ACUTE 10/08/1999  . HIV INFECTION 05/10/1999    Patient's Medications  New Prescriptions   No medications on file  Previous Medications   ACETAMINOPHEN (TYLENOL) 325 MG TABLET    Take 1 tablet (325 mg total) by mouth every 6 (six) hours as needed for mild pain or moderate pain (pain).   EFAVIRENZ (SUSTIVA) 600 MG TABLET    Take 600 mg by mouth at bedtime.   LAMIVUDINE-ZIDOVUDINE (COMBIVIR) 150-300 MG PER TABLET    Take 1 tablet by mouth 2 (two) times daily.   PREDNISONE (DELTASONE) 5 MG TABLET    Take 5 mg by mouth daily. Take on Monday, Wednesday and Friday nights   RALTEGRAVIR (ISENTRESS) 400 MG TABLET    Take 1 tablet (400 mg total) by mouth 2 (two) times daily.   TACROLIMUS (PROGRAF) 1 MG CAPSULE    Take 1 mg by mouth 2 (two) times daily. Take with 5 mg tablet for a 6 mg dose   TACROLIMUS (PROGRAF) 5 MG CAPSULE    Take 5 mg by mouth 2 (two) times daily. Take with 1 mg tablet for a 6 mg dose   ZOLPIDEM (AMBIEN) 10 MG TABLET    Take 10 mg by mouth at bedtime as needed for sleep.   Modified Medications   No medications on file  Discontinued Medications   No medications on file    Subjective: Luis Reed is in for his routine visit. As usual he never misses any doses of his medications. He has no current complaints.   Past Medical History  Diagnosis Date  . Renal disorder renal failure    History  Substance Use Topics  . Smoking status: Former Smoker -- 0.30  packs/day for 20 years  . Smokeless tobacco: Never Used  . Alcohol Use: No    No family history on file.  Allergies  Allergen Reactions  . Hydrocodone Itching and Other (See Comments)    fidgety    Objective: Temp: 97.8 F (36.6 C) (03/10 0910) Temp src: Oral (03/10 0910) BP: 115/68 mmHg (03/10 0913) Pulse Rate: 71 (03/10 0913) Body mass index is 22.36 kg/(m^2).  General: He is in no distress Oral: No oropharyngeal lesions Skin: No rash Lungs: Clear Cor: Regular S1 and S2 no murmurs  Lab Results Lab Results  Component Value Date   WBC 4.0 07/03/2013   HGB 15.1 07/03/2013   HCT 43.3 07/03/2013   MCV 111.0* 07/03/2013   PLT 172 07/03/2013    Lab Results  Component Value Date   CREATININE 1.51* 07/03/2013   BUN 16 07/03/2013   NA 140 07/03/2013   K 4.5 07/03/2013   CL 103 07/03/2013   CO2 28 07/03/2013    Lab Results  Component Value Date   ALT 21 07/03/2013   AST 19 07/03/2013   ALKPHOS 70 07/03/2013   BILITOT 0.5 07/03/2013    Lab Results  Component Value Date   CHOL 164 07/03/2013  HDL 94 07/03/2013   LDLCALC 52 07/03/2013   TRIG 88 07/03/2013   CHOLHDL 1.7 07/03/2013    Lab Results HIV 1 RNA Quant (copies/mL)  Date Value  07/03/2013 <20   12/27/2012 <20   06/07/2012 51*     CD4 T Cell Abs (/uL)  Date Value  07/03/2013 680   12/27/2012 650   06/07/2012 490      Assessment: His HIV infection remains under excellent control. He is on a rather complicated regimen since his renal transplant years ago. I offered him the option of simplifying to Truvada and Tivicay but he prefers to stay on his tried and true regimen.  Plan: 1. Continue current antiretroviral regimen   Luis AstersJohn Ranessa Kosta, MD [facility] [phone] pager   [phone] cell 07/16/2013, 9:23 AM

## 2013-09-26 ENCOUNTER — Other Ambulatory Visit: Payer: Self-pay | Admitting: Internal Medicine

## 2013-09-27 ENCOUNTER — Other Ambulatory Visit: Payer: Self-pay | Admitting: *Deleted

## 2013-09-27 DIAGNOSIS — B20 Human immunodeficiency virus [HIV] disease: Secondary | ICD-10-CM

## 2013-09-27 MED ORDER — EFAVIRENZ 600 MG PO TABS: 600.0000 mg | ORAL_TABLET | Freq: Every day | ORAL | Status: DC

## 2013-09-27 MED ORDER — LAMIVUDINE-ZIDOVUDINE 150-300 MG PO TABS: 1.0000 | ORAL_TABLET | Freq: Two times a day (BID) | ORAL | Status: DC

## 2014-01-02 ENCOUNTER — Other Ambulatory Visit: Payer: Medicare Other

## 2014-01-02 DIAGNOSIS — B20 Human immunodeficiency virus [HIV] disease: Secondary | ICD-10-CM

## 2014-01-02 LAB — COMPREHENSIVE METABOLIC PANEL
ALBUMIN: 4.4 g/dL (ref 3.5–5.2)
ALT: 17 U/L (ref 0–53)
AST: 22 U/L (ref 0–37)
Alkaline Phosphatase: 79 U/L (ref 39–117)
BUN: 22 mg/dL (ref 6–23)
CALCIUM: 8 mg/dL — AB (ref 8.4–10.5)
CHLORIDE: 103 meq/L (ref 96–112)
CO2: 24 meq/L (ref 19–32)
CREATININE: 1.54 mg/dL — AB (ref 0.50–1.35)
Glucose, Bld: 92 mg/dL (ref 70–99)
Potassium: 5 mEq/L (ref 3.5–5.3)
SODIUM: 136 meq/L (ref 135–145)
Total Bilirubin: 0.5 mg/dL (ref 0.2–1.2)
Total Protein: 8.1 g/dL (ref 6.0–8.3)

## 2014-01-03 LAB — HIV-1 RNA QUANT-NO REFLEX-BLD
HIV 1 RNA Quant: <20 copies/mL (ref <20)
HIV-1 RNA Quant, Log: <1.3 {Log} (ref <1.30)

## 2014-01-03 LAB — T-HELPER CELL (CD4) - (RCID CLINIC ONLY)
CD4 % Helper T Cell: 34 % (ref 33–55)
CD4 T CELL ABS: 590 /uL (ref 400–2700)

## 2014-01-16 ENCOUNTER — Encounter: Payer: Self-pay | Admitting: Internal Medicine

## 2014-01-16 ENCOUNTER — Ambulatory Visit (INDEPENDENT_AMBULATORY_CARE_PROVIDER_SITE_OTHER): Payer: PRIVATE HEALTH INSURANCE | Admitting: Internal Medicine

## 2014-01-16 VITALS — BP 113/76 | HR 76 | Temp 97.9°F | Wt 143.5 lb

## 2014-01-16 DIAGNOSIS — B20 Human immunodeficiency virus [HIV] disease: Secondary | ICD-10-CM

## 2014-01-16 DIAGNOSIS — B1710 Acute hepatitis C without hepatic coma: Secondary | ICD-10-CM

## 2014-01-16 DIAGNOSIS — Z23 Encounter for immunization: Secondary | ICD-10-CM

## 2014-01-16 DIAGNOSIS — Z79899 Other long term (current) drug therapy: Secondary | ICD-10-CM

## 2014-01-16 NOTE — Progress Notes (Signed)
Patient ID: Luis Reed, male   DOB: 1960-06-11, 53 y.o.   MRN: [id number]          Patient Active Problem List   Diagnosis Date Noted  . KIDNEY TRANSPLANTATION 07/11/2007  . HEPATITIS C 03/15/2006  . SYPHILIS 03/15/2006  . HYPERTENSION 03/15/2006  . RENAL DISEASE, END STAGE 03/15/2006  . HYDROCELE NOS 03/15/2006  . SEIZURE DISORDER 03/15/2006  . ALCOHOL ABUSE, HX OF 03/15/2006  . HEPATITIS B, HX OF 03/15/2006  . TOBACCO USE, QUIT 03/15/2006  . PARATHYROIDECTOMY 08/08/2002  . CEREBROVASCULAR ACCIDENT, ACUTE 10/08/1999  . HIV INFECTION 05/10/1999    Patient's Medications  New Prescriptions   No medications on file  Previous Medications   ACETAMINOPHEN (TYLENOL) 325 MG TABLET    Take 1 tablet (325 mg total) by mouth every 6 (six) hours as needed for mild pain or moderate pain (pain).   EFAVIRENZ (SUSTIVA) 600 MG TABLET    Take 1 tablet (600 mg total) by mouth at bedtime.   ISENTRESS 400 MG TABLET    TAKE 1 TABLET (400 MG TOTAL) BY MOUTH 2 (TWO) TIMES DAILY.   LAMIVUDINE-ZIDOVUDINE (COMBIVIR) 150-300 MG PER TABLET    Take 1 tablet by mouth 2 (two) times daily.   PREDNISONE (DELTASONE) 5 MG TABLET    Take 5 mg by mouth daily. Take on Monday, Wednesday and Friday nights   TACROLIMUS (PROGRAF) 1 MG CAPSULE    Take 1 mg by mouth 2 (two) times daily. Take with 5 mg tablet for a 6 mg dose   TACROLIMUS (PROGRAF) 5 MG CAPSULE    Take 5 mg by mouth 2 (two) times daily. Take with 1 mg tablet for a 6 mg dose   ZOLPIDEM (AMBIEN) 10 MG TABLET    Take 10 mg by mouth at bedtime as needed for sleep.   Modified Medications   No medications on file  Discontinued Medications   No medications on file    Subjective: Luis Reed is in for his routine visit. As usual he never misses a single dose of his HIV medications. He is tolerating them well and has no trouble obtaining them. He feels well and has no current problems or complaints. Review of Systems: Pertinent items are noted in HPI.  Past  Medical History  Diagnosis Date  . Renal disorder renal failure    History  Substance Use Topics  . Smoking status: Former Smoker -- 0.30 packs/day for 20 years  . Smokeless tobacco: Never Used  . Alcohol Use: No    No family history on file.  Allergies  Allergen Reactions  . Hydrocodone Itching and Other (See Comments)    fidgety    Objective: Temp: 97.9 F (36.6 C) (09/10 0918) Temp src: Oral (09/10 0918) BP: 113/76 mmHg (09/10 0918) Pulse Rate: 76 (09/10 0918) Body mass index is 21.82 kg/(m^2).  General: He is in good spirits Oral: No oropharyngeal lesions Skin: No rash Lungs: Clear Cor: Regular S1 and S2 with no murmurs Abdomen: Soft and nontender with no palpable masses  Lab Results Lab Results  Component Value Date   WBC 4.0 07/03/2013   HGB 15.1 07/03/2013   HCT 43.3 07/03/2013   MCV 111.0* 07/03/2013   PLT 172 07/03/2013    Lab Results  Component Value Date   CREATININE 1.54* 01/02/2014   BUN 22 01/02/2014   NA 136 01/02/2014   K 5.0 01/02/2014   CL 103 01/02/2014   CO2 24 01/02/2014    Lab Results  Component Value Date   ALT 17 01/02/2014   AST 22 01/02/2014   ALKPHOS 79 01/02/2014   BILITOT 0.5 01/02/2014    Lab Results  Component Value Date   CHOL 164 07/03/2013   HDL 94 07/03/2013   LDLCALC 52 07/03/2013   TRIG 88 07/03/2013   CHOLHDL 1.7 07/03/2013    Lab Results HIV 1 RNA Quant (copies/mL)  Date Value  01/02/2014 <20   07/03/2013 <20   12/27/2012 <20      CD4 T Cell Abs (/uL)  Date Value  01/02/2014 590   07/03/2013 680   12/27/2012 650      Assessment: His HIV infection remains under excellent control. Again, I offered him the option of considering a simplified antiretroviral regimen. He prefers to stay on his current regimen which he is very used to. I did suggest that we check lab work and prepare him for treatment of his hepatitis C and he is in agreement.  Plan: 1. Continue current antiretroviral regimen for now 2. Check HIV panel and  hepatitis C panel before his visit in 6 months   Cliffton Asters, MD [facility] [phone] pager   [phone] cell 01/16/2014, 9:44 AM

## 2014-01-31 ENCOUNTER — Telehealth: Payer: Self-pay | Admitting: *Deleted

## 2014-01-31 DIAGNOSIS — B1710 Acute hepatitis C without hepatic coma: Secondary | ICD-10-CM

## 2014-01-31 NOTE — Telephone Encounter (Signed)
Pt asking whether he is supposed to have abdominal U/S?  There is an order in EPIC.  The OV note from 01/16/14 states that the pt should return in 6 months following HIV and Hep C blood work.  MD, should the abdominal U/S be scheduled at this time?  Did this need to be an Elastography U/S?

## 2014-02-03 NOTE — Addendum Note (Signed)
Addended by: Jennet Maduro D on: 02/03/2014 12:19 PM   Modules accepted: Orders

## 2014-02-03 NOTE — Telephone Encounter (Addendum)
U/S Elastography-Tues. Oct 13th [facility] 0745 arrival.  NPO after 10 PM the night before.  Check-in at [facility] Admitting Office @ [phone].  Pt verbalized understanding.

## 2014-02-03 NOTE — Telephone Encounter (Signed)
Please schedule an [place] with elastography before his next visit.

## 2014-02-18 ENCOUNTER — Ambulatory Visit (HOSPITAL_COMMUNITY)
Admission: RE | Admit: 2014-02-18 | Discharge: 2014-02-18 | Disposition: A | Discharge status: Home / Self Care | Payer: PRIVATE HEALTH INSURANCE | Source: Ambulatory Visit | Attending: Internal Medicine | Admitting: Internal Medicine

## 2014-02-18 DIAGNOSIS — B1710 Acute hepatitis C without hepatic coma: Secondary | ICD-10-CM | POA: Diagnosis not present

## 2014-04-25 ENCOUNTER — Encounter (HOSPITAL_COMMUNITY): Payer: Self-pay | Admitting: Emergency Medicine

## 2014-04-25 ENCOUNTER — Emergency Department (HOSPITAL_COMMUNITY): Payer: No Typology Code available for payment source

## 2014-04-25 ENCOUNTER — Emergency Department (HOSPITAL_COMMUNITY)
Admission: EM | Admit: 2014-04-25 | Discharge: 2014-04-25 | Disposition: A | Discharge status: Home / Self Care | Payer: No Typology Code available for payment source | Attending: Emergency Medicine | Admitting: Emergency Medicine

## 2014-04-25 DIAGNOSIS — Y998 Other external cause status: Secondary | ICD-10-CM | POA: Diagnosis not present

## 2014-04-25 DIAGNOSIS — Z79899 Other long term (current) drug therapy: Secondary | ICD-10-CM | POA: Diagnosis not present

## 2014-04-25 DIAGNOSIS — S0990XA Unspecified injury of head, initial encounter: Secondary | ICD-10-CM | POA: Insufficient documentation

## 2014-04-25 DIAGNOSIS — S29001A Unspecified injury of muscle and tendon of front wall of thorax, initial encounter: Secondary | ICD-10-CM | POA: Diagnosis present

## 2014-04-25 DIAGNOSIS — Y9389 Activity, other specified: Secondary | ICD-10-CM | POA: Diagnosis not present

## 2014-04-25 DIAGNOSIS — S20212A Contusion of left front wall of thorax, initial encounter: Secondary | ICD-10-CM | POA: Diagnosis not present

## 2014-04-25 DIAGNOSIS — S199XXA Unspecified injury of neck, initial encounter: Secondary | ICD-10-CM | POA: Insufficient documentation

## 2014-04-25 DIAGNOSIS — Z7952 Long term (current) use of systemic steroids: Secondary | ICD-10-CM | POA: Insufficient documentation

## 2014-04-25 DIAGNOSIS — Z940 Kidney transplant status: Secondary | ICD-10-CM | POA: Insufficient documentation

## 2014-04-25 DIAGNOSIS — Z87448 Personal history of other diseases of urinary system: Secondary | ICD-10-CM | POA: Insufficient documentation

## 2014-04-25 DIAGNOSIS — Z87891 Personal history of nicotine dependence: Secondary | ICD-10-CM | POA: Diagnosis not present

## 2014-04-25 DIAGNOSIS — R52 Pain, unspecified: Secondary | ICD-10-CM

## 2014-04-25 DIAGNOSIS — Y92410 Unspecified street and highway as the place of occurrence of the external cause: Secondary | ICD-10-CM | POA: Diagnosis not present

## 2014-04-25 MED ORDER — TRAMADOL HCL 50 MG PO TABS: 50.0000 mg | ORAL_TABLET | Freq: Four times a day (QID) | ORAL | Status: DC | PRN

## 2014-04-25 MED ORDER — TRAMADOL HCL 50 MG PO TABS
50.0000 mg | ORAL_TABLET | Freq: Once | ORAL | Status: AC
  Administered 2014-04-25: 50 mg via ORAL
  Filled 2014-04-25: qty 1

## 2014-04-25 MED ORDER — OXYCODONE-ACETAMINOPHEN 5-325 MG PO TABS
1.0000 | ORAL_TABLET | Freq: Once | ORAL | Status: AC
  Administered 2014-04-25: 1 via ORAL
  Filled 2014-04-25: qty 1
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
## 2014-04-25 NOTE — ED Notes (Signed)
Pt. Left with all belongings 
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
## 2014-04-28 ENCOUNTER — Emergency Department (HOSPITAL_COMMUNITY): Payer: PRIVATE HEALTH INSURANCE

## 2014-04-28 ENCOUNTER — Emergency Department (HOSPITAL_COMMUNITY)
Admission: EM | Admit: 2014-04-28 | Discharge: 2014-04-28 | Disposition: A | Discharge status: Home / Self Care | Payer: PRIVATE HEALTH INSURANCE | Attending: Emergency Medicine | Admitting: Emergency Medicine

## 2014-04-28 ENCOUNTER — Encounter (HOSPITAL_COMMUNITY): Payer: Self-pay | Admitting: *Deleted

## 2014-04-28 DIAGNOSIS — Z87891 Personal history of nicotine dependence: Secondary | ICD-10-CM | POA: Diagnosis not present

## 2014-04-28 DIAGNOSIS — Z79899 Other long term (current) drug therapy: Secondary | ICD-10-CM | POA: Diagnosis not present

## 2014-04-28 DIAGNOSIS — Z7952 Long term (current) use of systemic steroids: Secondary | ICD-10-CM | POA: Diagnosis not present

## 2014-04-28 DIAGNOSIS — R51 Headache: Secondary | ICD-10-CM | POA: Insufficient documentation

## 2014-04-28 DIAGNOSIS — Z87448 Personal history of other diseases of urinary system: Secondary | ICD-10-CM | POA: Diagnosis not present

## 2014-04-28 DIAGNOSIS — H538 Other visual disturbances: Secondary | ICD-10-CM | POA: Insufficient documentation

## 2014-04-28 DIAGNOSIS — G8911 Acute pain due to trauma: Secondary | ICD-10-CM | POA: Insufficient documentation

## 2014-04-28 DIAGNOSIS — R110 Nausea: Secondary | ICD-10-CM | POA: Diagnosis not present

## 2014-04-28 MED ORDER — TRAMADOL HCL 50 MG PO TABS
100.0000 mg | ORAL_TABLET | Freq: Four times a day (QID) | ORAL | Status: DC | PRN
Start: 2014-04-28 — End: 2014-07-14
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
## 2014-04-28 NOTE — Discharge Instructions (Signed)
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
## 2014-05-13 ENCOUNTER — Ambulatory Visit (INDEPENDENT_AMBULATORY_CARE_PROVIDER_SITE_OTHER): Payer: Medicare Other | Admitting: Family

## 2014-05-13 ENCOUNTER — Encounter: Payer: Self-pay | Admitting: Family

## 2014-05-13 VITALS — BP 126/78 | HR 67 | Temp 97.7°F | Resp 18 | Ht 68.0 in | Wt 144.4 lb

## 2014-05-13 DIAGNOSIS — F0781 Postconcussional syndrome: Secondary | ICD-10-CM | POA: Insufficient documentation

## 2014-05-13 NOTE — Assessment & Plan Note (Signed)
Symptoms and exam consistent with postconcussive syndrome. Patient was recently prescribed naproxen by orthopedics. Recommend continuing Naprosyn prescription at current dosage and using tramadol as needed for breakthrough headaches. Discussed in detail with patient regarding length of potential symptoms and pain management. Given normal neurological exam, no need for further imaging at this time. Follow up if symptoms worsen or fail to improve.
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
## 2014-05-13 NOTE — Patient Instructions (Signed)
Thank you for choosing [facility].  Summary/Instructions:  Referrals have been made during this visit. You should expect to hear back from our schedulers in about 7-10 days in regards to establishing an appointment with the specialists we discussed.   If your symptoms worsen or fail to improve, please contact our office for further instruction, or in case of emergency go directly to the emergency room at the closest medical facility.    Post-Concussion Syndrome Post-concussion syndrome describes the symptoms that can occur after a head injury. These symptoms can last from weeks to months. CAUSES  It is not clear why some head injuries cause post-concussion syndrome. It can occur whether your head injury was mild or severe and whether you were wearing head protection or not.  SIGNS AND SYMPTOMS  Memory difficulties.  Dizziness.  Headaches.  Double vision or blurry vision.  Sensitivity to light.  Hearing difficulties.  Depression.  Tiredness.  Weakness.  Difficulty with concentration.  Difficulty sleeping or staying asleep.  Vomiting.  Poor balance or instability on your feet.  Slow reaction time.  Difficulty learning and remembering things you have heard. DIAGNOSIS  There is no test to determine whether you have post-concussion syndrome. Your health care provider may order an imaging scan of your brain, such as a CT scan, to check for other problems that may be causing your symptoms (such as severe injury inside your skull). TREATMENT  Usually, these problems disappear over time without medical care. Your health care provider may prescribe medicine to help ease your symptoms. It is important to follow up with a neurologist to evaluate your recovery and address any lingering symptoms or issues. HOME CARE INSTRUCTIONS   Only take over-the-counter or prescription medicines for pain, discomfort, or fever as directed by your health care provider. Do not take  aspirin. Aspirin can slow blood clotting.  Sleep with your head slightly elevated to help with headaches.  Avoid any situation where there is potential for another head injury (football, hockey, soccer, basketball, martial arts, downhill snow sports, and horseback riding). Your condition will get worse every time you experience a concussion. You should avoid these activities until you are evaluated by the appropriate follow-up health care providers.  Keep all follow-up appointments as directed by your health care provider. SEEK IMMEDIATE MEDICAL CARE IF:  You develop confusion or unusual drowsiness.  You cannot wake the injured person.  You develop nausea or persistent, forceful vomiting.  You feel like you are moving when you are not (vertigo).  You notice the injured person's eyes moving rapidly back and forth. This may be a sign of vertigo.  You have convulsions or faint.  You have severe, persistent headaches that are not relieved by medicine.  You cannot use your arms or legs normally.  Your pupils change size.  You have clear or bloody discharge from the nose or ears.  Your problems are getting worse, not better. MAKE SURE YOU:  Understand these instructions.  Will watch your condition.  Will get help right away if you are not doing well or get worse. Document Released: 10/15/2001 Document Revised: 02/13/2013 Document Reviewed: 07/31/2013 [facility] Patient Information 2015 [place]ines[place]Care, [place]. This information is not intended to replace advice given to you by your health care provider. Make sure you discuss any questions you have with your health care provider.

## 2014-05-13 NOTE — Progress Notes (Signed)
Pre visit review using our clinic review tool, if applicable. No additional management support is needed unless otherwise documented below in the visit note. 

## 2014-05-15 ENCOUNTER — Ambulatory Visit: Payer: No Typology Code available for payment source | Admitting: Physical Therapy

## 2014-05-16 ENCOUNTER — Telehealth: Payer: Self-pay | Admitting: *Deleted

## 2014-05-16 NOTE — Telephone Encounter (Signed)
Out-Of-Pocket rx deductible of $800 w/ pt's Part D Medicare.  Pt needs to sign-up for SPAP to cover this expense.  RN conferred with [employer] Counselor.  Pt needs to bring his Medicare Part D card, Medicaid card, Medicare card and Social Security award letter to appt w/ Financial Counselor to apply for SPAP.  Pt informed that he will be without his medication until his SPAP is approved.  Pt verbalized understanding and wants to sign up as soon as possible.  Pt also advised that [facility].  Would like his medications mailed to him from [place].

## 2014-05-19 ENCOUNTER — Ambulatory Visit: Payer: Medicare Other

## 2014-05-21 ENCOUNTER — Ambulatory Visit: Payer: Medicare Other

## 2014-05-21 ENCOUNTER — Telehealth: Payer: Self-pay | Admitting: *Deleted

## 2014-05-21 NOTE — Telephone Encounter (Signed)
Called the [employer].  Mr. Luis ParrChapman has been approved for a $7500.00 grant.  It is effective 05-21-14 - 05-21-15.

## 2014-05-23 ENCOUNTER — Ambulatory Visit: Payer: No Typology Code available for payment source | Attending: Orthopaedic Surgery | Admitting: Physical Therapy

## 2014-05-23 DIAGNOSIS — M545 Low back pain: Secondary | ICD-10-CM | POA: Insufficient documentation

## 2014-05-23 DIAGNOSIS — M542 Cervicalgia: Secondary | ICD-10-CM | POA: Insufficient documentation
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
## 2014-05-23 NOTE — Patient Instructions (Signed)
Sitting postural correction, basic body mechanics, prone press ups

## 2014-05-26 ENCOUNTER — Other Ambulatory Visit: Payer: Self-pay | Admitting: *Deleted

## 2014-05-26 DIAGNOSIS — Z21 Asymptomatic human immunodeficiency virus [HIV] infection status: Secondary | ICD-10-CM

## 2014-05-26 DIAGNOSIS — B20 Human immunodeficiency virus [HIV] disease: Secondary | ICD-10-CM

## 2014-05-26 MED ORDER — LAMIVUDINE-ZIDOVUDINE 150-300 MG PO TABS: 1.0000 | ORAL_TABLET | Freq: Two times a day (BID) | ORAL | Status: DC

## 2014-05-26 MED ORDER — RALTEGRAVIR POTASSIUM 400 MG PO TABS: ORAL_TABLET | ORAL | Status: DC

## 2014-05-26 MED ORDER — EFAVIRENZ 600 MG PO TABS: 600.0000 mg | ORAL_TABLET | Freq: Every day | ORAL | Status: DC

## 2014-05-27 ENCOUNTER — Ambulatory Visit: Payer: No Typology Code available for payment source | Admitting: Rehabilitation

## 2014-05-27 DIAGNOSIS — M545 Low back pain: Secondary | ICD-10-CM

## 2014-05-27 DIAGNOSIS — M542 Cervicalgia: Secondary | ICD-10-CM

## 2014-05-27 NOTE — Patient Instructions (Signed)
Knee to Chest (Flexion)   Pull knee toward chest. Feel stretch in lower back or buttock area. Breathing deeply, Hold __30__ seconds. Repeat with other knee. Repeat _3___ times each side. Do __2__ sessions per day.  http://gt2.exer.us/225   Copyright  VHI. All rights reserved.    Lower Trunk Rotation Stretch   Keeping back flat and feet together, rotate knees to left side. Hold ___30_ seconds. Repeat __3_ times per set. . Do __2__ sessions per day.  http://orth.exer.us/122   NECK: Capital Extension   Sitting: Tuck chin, move head and neck backward. Lying Down: Tuck chin, push back of neck into pillow. HOLD 5 SEC __10_ reps per set, _1-2__ sets per day, _7__ days per week  Copyright  VHI. All rights reserved.

## 2014-05-27 NOTE — Therapy (Signed)
[facility] [address] [place], [place], [address] Phone: [phone]   Fax:  [phone]  Physical Therapy Treatment  Patient Details  Name: Luis Reed MRN: [id number] Date of Birth: 1961/01/04 Referring Provider:  Jeanine Luzalone, Gregory, FNP  Encounter Date: 05/27/2014      PT End of Session - 05/27/14 0739    Visit Number 2   Number of Visits 16   Date for PT Re-Evaluation 07/18/14   PT Start Time 0735   PT Stop Time 0815   PT Time Calculation (min) 40 min      Past Medical History  Diagnosis Date  . Renal disorder renal failure    Past Surgical History  Procedure Laterality Date  . Nephrectomy transplanted organ      There were no vitals taken for this visit.  Visit Diagnosis:  Bilateral low back pain, with sciatica presence unspecified  Neck pain      Subjective Assessment - 05/27/14 0737    Symptoms 3/10 constant neck posterior neck and between shoulder blades. 5/10 constant mid low back pain.   Pertinent History Kidney transplant, Hep C;HIV                    [facility] Adult PT Treatment/Exercise - 05/27/14 0741    Neck Exercises: Supine   Neck Retraction 10 reps;5 secs   Lumbar Exercises: Stretches   Single Knee to Chest Stretch 3 reps;30 seconds   Lower Trunk Rotation 3 reps;30 seconds  with head turns.   Press Ups 5 reps;10 seconds   Modalities   Modalities Moist Heat;Electrical Stimulation   Moist Heat Therapy   Number Minutes Moist Heat 15 Minutes   Moist Heat Location --  upper and lower back   Electrical Stimulation   Electrical Stimulation Location --  upper and lower back   Electrical Stimulation Action premod   Electrical Stimulation Parameters to tolerance   Electrical Stimulation Goals Pain                PT Education - 05/27/14 1045    Education provided Yes   Education Details HEP   Person(s) Educated Patient   Methods Explanation;Handout   Comprehension Verbalized understanding          PT Short Term Goals - 05/23/14 1229    PT SHORT TERM GOAL #1   Title "Independent with initial HEP   Time 4   Period Weeks   Status New   PT SHORT TERM GOAL #2   Title "Demonstrate understanding of proper sitting posture and be more conscious of position and posture throughout the day.    Time 4   Period Weeks   Status New   PT SHORT TERM GOAL #3   Title Left Cervical rotation improved to 40 degrees needed for turning head for driving.     Time 4   Period Weeks   Status New   PT SHORT TERM GOAL #4   Title Overall pain level improved by 25% in back and neck per patient report.   Time 4   Period Weeks   Status New           PT Long Term Goals - 05/23/14 1233    PT LONG TERM GOAL #1   Title "Pt will be independent with advanced HEP.    Time 8   Period Weeks   Status New   PT LONG TERM GOAL #2   Title Lumbar flexion ROM improved to 60  degrees and extension to 20 degrees needed for improved mobility getting in/out of the car.   Time 8   Period Weeks   Status New   PT LONG TERM GOAL #3   Title FOTO functional outcome score improved from 55% limit to 32% indicating improved function with less pain.   Time 8   Period Weeks   Status New   PT LONG TERM GOAL #4   Title Patient will report a 50% improvement in sleep and will be able to sleep in his bed (vs. recliner).   Time 8   Period Weeks   Status New               Plan - 05/27/14 1046    Clinical Impression Statement able to advance HEP   PT Next Visit Plan review and advance HEP, Nustep? assess benefit of Estim        Problem List Patient Active Problem List   Diagnosis Date Noted  . Post concussive syndrome 05/13/2014  . KIDNEY TRANSPLANTATION 07/11/2007  . HEPATITIS C 03/15/2006  . SYPHILIS 03/15/2006  . HYPERTENSION 03/15/2006  . RENAL DISEASE, END STAGE 03/15/2006  . HYDROCELE NOS 03/15/2006  . SEIZURE DISORDER 03/15/2006  . ALCOHOL ABUSE,  HX OF 03/15/2006  . HEPATITIS B, HX OF 03/15/2006  . TOBACCO USE, QUIT 03/15/2006  . PARATHYROIDECTOMY 08/08/2002  . CEREBROVASCULAR ACCIDENT, ACUTE 10/08/1999  . HIV INFECTION 05/10/1999    Sherrie Mustache, [place] 05/27/2014, 10:47 AM  [facility] [address] [place], [place], [address] Phone: [phone]   Fax:  [phone]

## 2014-05-30 ENCOUNTER — Ambulatory Visit: Payer: No Typology Code available for payment source

## 2014-06-03 ENCOUNTER — Ambulatory Visit: Payer: No Typology Code available for payment source | Admitting: Rehabilitation

## 2014-06-03 ENCOUNTER — Telehealth: Payer: Self-pay | Admitting: Family

## 2014-06-03 DIAGNOSIS — M545 Low back pain: Secondary | ICD-10-CM | POA: Diagnosis not present

## 2014-06-03 DIAGNOSIS — M542 Cervicalgia: Secondary | ICD-10-CM

## 2014-06-03 MED ORDER — ACETAMINOPHEN-CODEINE #3 300-30 MG PO TABS: 1.0000 | ORAL_TABLET | Freq: Three times a day (TID) | ORAL | Status: DC | PRN

## 2014-06-03 NOTE — Telephone Encounter (Signed)
He has allergies to hydrocodone - is he able to take anything with codeine in it? Please advise.

## 2014-06-03 NOTE — Therapy (Signed)
[facility] [address] [place], [place], [address] Phone: [phone]   Fax:  [phone]  Physical Therapy Treatment  Patient Details  Name: Luis HagemanWalter L Meuser MRN: [id number] Date of Birth: 1961-02-02 Referring Provider:  Jeanine Luzalone, Gregory, FNP  Encounter Date: 06/03/2014      PT End of Session - 06/03/14 0818    Visit Number 3   Number of Visits 16   Date for PT Re-Evaluation 07/18/14   PT Start Time 0805   PT Stop Time 0905   PT Time Calculation (min) 60 min      Past Medical History  Diagnosis Date  . Renal disorder renal failure    Past Surgical History  Procedure Laterality Date  . Nephrectomy transplanted organ      There were no vitals taken for this visit.  Visit Diagnosis:  Bilateral low back pain, with sciatica presence unspecified  Neck pain      Subjective Assessment - 06/03/14 0816    Symptoms 3/10 LBP throbbing, 3-4/10 pressing pain between shoulder blades, Lt neck pain with left cervical rotation.    Pain Frequency Constant   Aggravating Factors  Morning pain   Pain Relieving Factors warming up          [facility] PT Assessment - 06/03/14 0841    AROM   Cervical - Right Rotation 55   Cervical - Left Rotation 55  ERP   Lumbar Flexion 70   Lumbar Extension 30  ERP                  [facility] Adult PT Treatment/Exercise - 06/03/14 0823    Neck Exercises: Stretches   Upper Trapezius Stretch 3 reps;10 seconds   Levator Stretch 3 reps;10 seconds   Neck Exercises: Seated   Neck Retraction 10 reps;5 secs   Postural Training Scap retraction 5 sec x 10   Lumbar Exercises: Stretches   Single Knee to Chest Stretch 3 reps;30 seconds   Lower Trunk Rotation 3 reps;30 seconds  with head turns.   Pelvic Tilt --  10 reps 5 sec   Lumbar Exercises: Supine   Ab Set 10 reps   Clam 10 reps   Heel Slides 10 reps   Bent Knee Raise 10 reps   Bridge 10 reps   Modalities   Modalities Moist  Heat;Electrical Stimulation   Moist Heat Therapy   Number Minutes Moist Heat 15 Minutes   Moist Heat Location --  upper and lower back   Electrical Stimulation   Electrical Stimulation Location --  upper and lower back   Electrical Stimulation Action pre mod   Electrical Stimulation Parameters 12   Electrical Stimulation Goals Pain                  PT Short Term Goals - 06/03/14 0835    PT SHORT TERM GOAL #1   Title "Independent with initial HEP   Time 4   Period Weeks   Status Achieved   PT SHORT TERM GOAL #2   Title "Demonstrate understanding of proper sitting posture and be more conscious of position and posture throughout the day.    Time 4   Period Weeks   Status Achieved   PT SHORT TERM GOAL #3   Title Left Cervical rotation improved to 40 degrees needed for turning head for driving.     Time 4   Period Weeks   Status Achieved   PT SHORT TERM GOAL #4   Title  Overall pain level improved by 25% in back and neck per patient report.   Time 4   Period Weeks   Status Achieved           PT Long Term Goals - 06/03/14 0836    PT LONG TERM GOAL #1   Title "Pt will be independent with advanced HEP.    Time 8   Period Weeks   Status On-going   PT LONG TERM GOAL #2   Title Lumbar flexion ROM improved to 60 degrees and extension to 20 degrees needed for improved mobility getting in/out of the car.   Time 8   Period Weeks   Status Achieved   PT LONG TERM GOAL #3   Title FOTO functional outcome score improved from 55% limit to 32% indicating improved function with less pain.   Time 8   Status On-going   PT LONG TERM GOAL #4   Title Patient will report a 50% improvement in sleep and will be able to sleep in his bed (vs. recliner).   Time 8   Period Weeks   Status Achieved               Plan - 06/03/14 1610    Clinical Impression Statement Pt reports overall decrease in pain 25% to 50% to allow him to sleep in bed and decreased pain with transfers  in/ out of car and truck. See Goals Achieved.   PT Next Visit Plan Review and advance HEP, try therabands for scap, continue Nustep and modalities        Problem List Patient Active Problem List   Diagnosis Date Noted  . Post concussive syndrome 05/13/2014  . KIDNEY TRANSPLANTATION 07/11/2007  . HEPATITIS C 03/15/2006  . SYPHILIS 03/15/2006  . HYPERTENSION 03/15/2006  . RENAL DISEASE, END STAGE 03/15/2006  . HYDROCELE NOS 03/15/2006  . SEIZURE DISORDER 03/15/2006  . ALCOHOL ABUSE, HX OF 03/15/2006  . HEPATITIS B, HX OF 03/15/2006  . TOBACCO USE, QUIT 03/15/2006  . PARATHYROIDECTOMY 08/08/2002  . CEREBROVASCULAR ACCIDENT, ACUTE 10/08/1999  . HIV INFECTION 05/10/1999    Sherrie Mustache, [place] 06/03/2014, 9:46 AM  [facility] [address] [place], [place], [address] Phone: [phone]   Fax:  [phone]

## 2014-06-03 NOTE — Patient Instructions (Signed)
Levator Stretch   Grasp seat or sit on hand on side to be stretched. Turn head toward other side and look down. Use hand on head to gently stretch neck in that position. Hold __20__ seconds. Repeat on other side. Repeat ___3_ times. Do ___2_ sessions per day.  http://gt2.exer.us/30   Copyright  VHI. All rights reserved.  Side-Bending   One hand on opposite side of head, pull head to side as far as is comfortable. Stop if there is pain. Hold _20___ seconds. Repeat with other hand to other side. Repeat _3___ times each way. Do _2___ sessions per day.   Copyright  VHI. All rights reserved.  Scapular Retraction (Standing)   With arms at sides, pinch shoulder blades together. HOLD 5 sec Repeat __10__ times per set. Do _2___ sets per session. Do __2__ sessions per day.  http://orth.exer.us/944   Copyright  VHI. All rights reserved.  Chin Protraction / Retraction   Slide head forward keeping chin level. Slide head back, pulling chin in. Hold each position _5__ seconds. Repeat __10_ times. Do ___ sessions per day.  Copyright  VHI. All rights reserved.    PELVIC TILT  Lie on back, legs bent. Exhale, tilting top of pelvis back, pubic bone up, to flatten lower back. Inhale, rolling pelvis opposite way, top forward, pubic bone down, arch in back. Repeat __10__ times. Do __2__ sessions per day. Copyright  VHI. All rights reserved.    Isometric Hold With Pelvic Floor (Hook-Lying)  Lie with hips and knees bent. Slowly inhale, and then exhale. Pull navel toward spine and tighten pelvic floor. Hold for __10_ seconds. Continue to breathe in and out during hold. Rest for _10__ seconds. Repeat __10_ times. Do __2-3_ times a day.   Knee Fold  Lie on back, legs bent, arms by sides. Exhale, lifting knee to chest. Inhale, returning. Keep abdominals flat, navel to spine. Repeat __10__ times, alternating legs. Do __2__ sessions per day.  Knee Drop  Keep pelvis stable. Without rotating  hips, slowly drop knee to side, pause, return to center, bring knee across midline toward opposite hip. Feel obliques engaging. Repeat for ___10_ times each leg.   Copyright  VHI. All rights reserved.       Heel Slide to Straight   Slide one leg down to straight. Return. Be sure pelvis does not rock forward, tilt, rotate, or tip to side. Do _10__ times. Restabilize pelvis. Repeat with other leg. Do __1-2_ sets, __2_ times per day.  http://ss.exer.us/16   Copyright  VHI. All rights reserved.

## 2014-06-03 NOTE — Telephone Encounter (Signed)
Prescription written

## 2014-06-03 NOTE — Telephone Encounter (Signed)
Pt called in and said that meds for his head was not working and he is still having pain?  What should he do?

## 2014-06-03 NOTE — Telephone Encounter (Signed)
Not allergic to codiene

## 2014-06-04 NOTE — Telephone Encounter (Signed)
Pt aware. Supposed to swing by and pick it up at some time today

## 2014-06-05 ENCOUNTER — Ambulatory Visit: Payer: No Typology Code available for payment source | Admitting: Rehabilitation

## 2014-06-05 DIAGNOSIS — M545 Low back pain: Secondary | ICD-10-CM

## 2014-06-05 DIAGNOSIS — M542 Cervicalgia: Secondary | ICD-10-CM

## 2014-06-05 NOTE — Therapy (Signed)
[facility] [address] [place], [place], [address] Phone: [phone]   Fax:  [phone]  Physical Therapy Treatment  Patient Details  Name: Luis HagemanWalter L Bezek MRN: [id number] Date of Birth: Jun 30, 1960 Referring Provider:  Cheral AlmasXu, Naiping Michael, MD  Encounter Date: 06/05/2014      PT End of Session - 06/05/14 0819    Visit Number 4   Number of Visits 16   Date for PT Re-Evaluation 07/18/14   PT Start Time 0801   PT Stop Time 0900   PT Time Calculation (min) 59 min      Past Medical History  Diagnosis Date  . Renal disorder renal failure    Past Surgical History  Procedure Laterality Date  . Nephrectomy transplanted organ      There were no vitals taken for this visit.  Visit Diagnosis:  Bilateral low back pain, with sciatica presence unspecified  Neck pain      Subjective Assessment - 06/05/14 0816    Symptoms 1/10 low back pian. It feels looser this morning, less pain.   Pertinent History Kidney transplant, Hep C;HIV   Pain Descriptors / Indicators Dull   Pain Frequency Constant                    [facility] Adult PT Treatment/Exercise - 06/05/14 0820    Lumbar Exercises: Stretches   Pelvic Tilt --  10 reps 5 sec   Lumbar Exercises: Supine   Ab Set 10 reps   Clam 10 reps   Heel Slides 10 reps   Bent Knee Raise 10 reps   Bridge 10 reps   Modalities   Modalities Traction;Electrical Stimulation;Moist Heat   Moist Heat Therapy   Number Minutes Moist Heat 15 Minutes   Moist Heat Location --  upper and lower back, neck   Electrical Stimulation   Electrical Stimulation Location --  upper and lower back   Electrical Stimulation Action pre mod   Electrical Stimulation Parameters to tolerance   Electrical Stimulation Goals Pain   Traction   Type of Traction Cervical   Min (lbs) 5   Max (lbs) 10   Hold Time 60   Rest Time 15   Time 10                  PT Short Term Goals -  06/03/14 0835    PT SHORT TERM GOAL #1   Title "Independent with initial HEP   Time 4   Period Weeks   Status Achieved   PT SHORT TERM GOAL #2   Title "Demonstrate understanding of proper sitting posture and be more conscious of position and posture throughout the day.    Time 4   Period Weeks   Status Achieved   PT SHORT TERM GOAL #3   Title Left Cervical rotation improved to 40 degrees needed for turning head for driving.     Time 4   Period Weeks   Status Achieved   PT SHORT TERM GOAL #4   Title Overall pain level improved by 25% in back and neck per patient report.   Time 4   Period Weeks   Status Achieved           PT Long Term Goals - 06/03/14 0836    PT LONG TERM GOAL #1   Title "Pt will be independent with advanced HEP.    Time 8   Period Weeks   Status On-going   PT LONG TERM  GOAL #2   Title Lumbar flexion ROM improved to 60 degrees and extension to 20 degrees needed for improved mobility getting in/out of the car.   Time 8   Period Weeks   Status Achieved   PT LONG TERM GOAL #3   Title FOTO functional outcome score improved from 55% limit to 32% indicating improved function with less pain.   Time 8   Status On-going   PT LONG TERM GOAL #4   Title Patient will report a 50% improvement in sleep and will be able to sleep in his bed (vs. recliner).   Time 8   Period Weeks   Status Achieved               Plan - 06/05/14 0825    Clinical Impression Statement Pt reports he no longer has numbness in his LLE. He does report numbness 2 x per day in his L hand and shoulder relieved by position change. Trial of cervical traction today.    PT Next Visit Plan Review and advance HEP, try therabands for scap, continue Nustep and modalities Assess benefit of traction        Problem List Patient Active Problem List   Diagnosis Date Noted  . Post concussive syndrome 05/13/2014  . KIDNEY TRANSPLANTATION 07/11/2007  . HEPATITIS C 03/15/2006  . SYPHILIS  03/15/2006  . HYPERTENSION 03/15/2006  . RENAL DISEASE, END STAGE 03/15/2006  . HYDROCELE NOS 03/15/2006  . SEIZURE DISORDER 03/15/2006  . ALCOHOL ABUSE, HX OF 03/15/2006  . HEPATITIS B, HX OF 03/15/2006  . TOBACCO USE, QUIT 03/15/2006  . PARATHYROIDECTOMY 08/08/2002  . CEREBROVASCULAR ACCIDENT, ACUTE 10/08/1999  . HIV INFECTION 05/10/1999    Luis Reed 06/05/2014, 8:49 AM  [facility] [address] [place], [place], [address] Phone: [phone]   Fax:  [phone]

## 2014-06-09 ENCOUNTER — Ambulatory Visit: Payer: Medicare Other | Attending: Orthopaedic Surgery | Admitting: Rehabilitation

## 2014-06-09 DIAGNOSIS — M542 Cervicalgia: Secondary | ICD-10-CM | POA: Diagnosis not present

## 2014-06-09 DIAGNOSIS — M545 Low back pain: Secondary | ICD-10-CM | POA: Diagnosis not present

## 2014-06-09 NOTE — Patient Instructions (Signed)
Combination (Hook-Lying)   Tighten stomach and slowly raise left leg and lower opposite arm over head. Keep trunk rigid. Repeat _20___ times per set. Do __2__ sets per session. Do ___2_ sessions per day.  http://orth.exer.us/1092   Copyright  VHI. All rights reserved.  EXTENSION: Standing - Resistance Band: Stable (Active)   Stand, right arm at side. Against yellow resistance band, draw arm backward, as far as possible, keeping elbow straight. Complete _2__ sets of _15__ repetitions. Perform __2_ sessions per day.    Copyright  VHI. All rights reserved.  Resistive Band Rowing   With resistive band anchored in door, grasp both ends. Keeping elbows bent, pull back, squeezing shoulder blades together. Hold __5__ seconds. Repeat _15___ times. 2 sets.  Do ___2_ sessions per day.  http://gt2.exer.us/97   Copyright  VHI. All rights reserved.

## 2014-06-09 NOTE — Therapy (Signed)
[facility] [address] [place], [place], [address] Phone: [phone]   Fax:  [phone]  Physical Therapy Treatment  Patient Details  Name: Luis Reed MRN: [id number] Date of Birth: 05-19-1960 Referring Provider:  Jeanine Luz, FNP  Encounter Date: 06/09/2014      PT End of Session - 06/09/14 1433    Visit Number 5   Number of Visits 16   Date for PT Re-Evaluation 07/18/14   PT Start Time 0217   PT Stop Time 0305   PT Time Calculation (min) 48 min      Past Medical History  Diagnosis Date  . Renal disorder renal failure    Past Surgical History  Procedure Laterality Date  . Nephrectomy transplanted organ      There were no vitals taken for this visit.  Visit Diagnosis:  Bilateral low back pain, with sciatica presence unspecified  Neck pain      Subjective Assessment - 06/09/14 1431    Symptoms Stiff in the mornings.    Currently in Pain? No/denies          [facility] PT Assessment - 06/09/14 1429    AROM   Cervical - Right Rotation 65   Cervical - Left Rotation 70  erp                  [facility] Adult PT Treatment/Exercise - 06/09/14 1433    Neck Exercises: Theraband   Shoulder Extension 15 reps;Red   Rows 15 reps;Red  cues for full retract/depress   Neck Exercises: Supine   Other Supine Exercise Cervical Stab level 2 x 10 each   Lumbar Exercises: Aerobic   Stationary Bike Nustep Level 7 x 10 min UE/LE   Lumbar Exercises: Supine   Dead Bug 20 reps   Other Supine Lumbar Exercises Scissors Level 2 x 20, Level 3 x 20   Traction   Type of Traction Cervical   Min (lbs) 6   Max (lbs) 12   Hold Time 60   Rest Time 15   Time 12                PT Education - 06/09/14 1451    Education provided Yes   Education Details hep   Person(s) Educated Patient   Methods Explanation;Handout   Comprehension Verbalized understanding          PT Short Term Goals - 06/03/14 0835     PT SHORT TERM GOAL #1   Title "Independent with initial HEP   Time 4   Period Weeks   Status Achieved   PT SHORT TERM GOAL #2   Title "Demonstrate understanding of proper sitting posture and be more conscious of position and posture throughout the day.    Time 4   Period Weeks   Status Achieved   PT SHORT TERM GOAL #3   Title Left Cervical rotation improved to 40 degrees needed for turning head for driving.     Time 4   Period Weeks   Status Achieved   PT SHORT TERM GOAL #4   Title Overall pain level improved by 25% in back and neck per patient report.   Time 4   Period Weeks   Status Achieved           PT Long Term Goals - 06/03/14 0836    PT LONG TERM GOAL #1   Title "Pt will be independent with advanced HEP.    Time 8  Period Weeks   Status On-going   PT LONG TERM GOAL #2   Title Lumbar flexion ROM improved to 60 degrees and extension to 20 degrees needed for improved mobility getting in/out of the car.   Time 8   Period Weeks   Status Achieved   PT LONG TERM GOAL #3   Title FOTO functional outcome score improved from 55% limit to 32% indicating improved function with less pain.   Time 8   Status On-going   PT LONG TERM GOAL #4   Title Patient will report a 50% improvement in sleep and will be able to sleep in his bed (vs. recliner).   Time 8   Period Weeks   Status Achieved               Plan - 06/09/14 1454    Clinical Impression Statement Continued morning stiffness in low back. Cervical AROM rotation improved. He reports less numbness in left hand after traction.    PT Next Visit Plan Review new HEP, continue traction, ready for DC to HEP soon.         Problem List Patient Active Problem List   Diagnosis Date Noted  . Post concussive syndrome 05/13/2014  . KIDNEY TRANSPLANTATION 07/11/2007  . HEPATITIS C 03/15/2006  . SYPHILIS 03/15/2006  . HYPERTENSION 03/15/2006  . RENAL DISEASE, END STAGE 03/15/2006  . HYDROCELE NOS 03/15/2006  .  SEIZURE DISORDER 03/15/2006  . ALCOHOL ABUSE, HX OF 03/15/2006  . HEPATITIS B, HX OF 03/15/2006  . TOBACCO USE, QUIT 03/15/2006  . PARATHYROIDECTOMY 08/08/2002  . CEREBROVASCULAR ACCIDENT, ACUTE 10/08/1999  . HIV INFECTION 05/10/1999    Sherrie Mustacheonoho, Luis Reed , [place]  06/09/2014, 3:04 PM  [facility] [address] [place], [place], [address] Phone: [phone]   Fax:  [phone]

## 2014-06-12 ENCOUNTER — Ambulatory Visit: Payer: Medicare Other | Admitting: Rehabilitation

## 2014-06-12 DIAGNOSIS — M545 Low back pain: Secondary | ICD-10-CM | POA: Diagnosis not present

## 2014-06-12 DIAGNOSIS — M542 Cervicalgia: Secondary | ICD-10-CM

## 2014-06-12 NOTE — Therapy (Signed)
[place], [place], [address] Phone: [phone]   Fax:  [phone]  Physical Therapy Treatment  Patient Details  Name: Luis Reed MRN: [id number] Date of Birth: August 21, 1960 Referring Provider:  Marianna Payment, MD  Encounter Date: 06/12/2014      PT End of Session - 06/12/14 1107    Visit Number 6   Number of Visits 16   Date for PT Re-Evaluation 07/18/14   PT Start Time 78      Past Medical History  Diagnosis Date  . Renal disorder renal failure    Past Surgical History  Procedure Laterality Date  . Nephrectomy transplanted organ      There were no vitals taken for this visit.  Visit Diagnosis:  Bilateral low back pain, with sciatica presence unspecified  Neck pain      Subjective Assessment - 06/12/14 1107    Symptoms I have not felt N/T in left hand since traction.   Currently in Pain? No/denies                    [facility] Adult PT Treatment/Exercise - 06/12/14 1119    Neck Exercises: Theraband   Shoulder Extension 15 reps;Green   Rows 15 reps;Green  cues for full retract/depress   Neck Exercises: Supine   Neck Retraction 10 reps;5 secs   Other Supine Exercise Cervical Stab level 2 x 10 each   Lumbar Exercises: Aerobic   Stationary Bike Nustep Level 7 x 10 min UE/LE   Lumbar Exercises: Supine   Dead Bug 20 reps  1 minute x 2   Straight Leg Raise 15 reps   Traction   Type of Traction Cervical   Min (lbs) 7   Max (lbs) 14   Hold Time 60   Rest Time 15   Time 13                  PT Short Term Goals - 06/03/14 1638    PT SHORT TERM GOAL #1   Title "Independent with initial HEP   Time 4   Period Weeks   Status Achieved   PT SHORT TERM GOAL #2   Title "Demonstrate understanding of proper sitting posture and be more conscious of position and posture throughout the day.    Time 4   Period Weeks   Status Achieved   PT SHORT TERM GOAL #3    Title Left Cervical rotation improved to 40 degrees needed for turning head for driving.     Time 4   Period Weeks   Status Achieved   PT SHORT TERM GOAL #4   Title Overall pain level improved by 25% in back and neck per patient report.   Time 4   Period Weeks   Status Achieved           PT Long Term Goals - 06/03/14 0836    PT LONG TERM GOAL #1   Title "Pt will be independent with advanced HEP.    Time 8   Period Weeks   Status On-going   PT LONG TERM GOAL #2   Title Lumbar flexion ROM improved to 60 degrees and extension to 20 degrees needed for improved mobility getting in/out of the car.   Time 8   Period Weeks   Status Achieved   PT LONG TERM GOAL #3   Title FOTO functional outcome score improved from 55% limit to 32% indicating improved function with less  pain.   Time 8   Status On-going   PT LONG TERM GOAL #4   Title Patient will report a 50% improvement in sleep and will be able to sleep in his bed (vs. recliner).   Time 8   Period Weeks   Status Achieved               Plan - 06/12/14 1112    Clinical Impression Statement Radicular symptoms resolved after cervical traction. All LTGs MET except FOTO limitation improved from 55% to 43% limited ( GOAL  32%)   PT Next Visit Plan Review HEP and maybe one more traction and DC next visit. G CODE        Problem List Patient Active Problem List   Diagnosis Date Noted  . Post concussive syndrome 05/13/2014  . KIDNEY TRANSPLANTATION 07/11/2007  . HEPATITIS C 03/15/2006  . SYPHILIS 03/15/2006  . HYPERTENSION 03/15/2006  . RENAL DISEASE, END STAGE 03/15/2006  . HYDROCELE NOS 03/15/2006  . SEIZURE DISORDER 03/15/2006  . ALCOHOL ABUSE, HX OF 03/15/2006  . HEPATITIS B, HX OF 03/15/2006  . TOBACCO USE, QUIT 03/15/2006  . PARATHYROIDECTOMY 08/08/2002  . CEREBROVASCULAR ACCIDENT, ACUTE 10/08/1999  . HIV INFECTION 05/10/1999    Dorene Ar, [place] 06/12/2014, 11:38 AM  [facility] [address] [place], [place], [address] Phone: [phone]   Fax:  [phone]

## 2014-06-17 ENCOUNTER — Telehealth: Payer: Self-pay | Admitting: *Deleted

## 2014-06-17 ENCOUNTER — Ambulatory Visit: Payer: Medicare Other | Admitting: Physical Therapy

## 2014-06-17 DIAGNOSIS — M545 Low back pain: Secondary | ICD-10-CM | POA: Diagnosis not present

## 2014-06-17 DIAGNOSIS — M542 Cervicalgia: Secondary | ICD-10-CM

## 2014-06-17 NOTE — Therapy (Signed)
[place], [place], [address] Phone: [phone]   Fax:  [phone]  Physical Therapy Treatment  Patient Details  Name: Luis Reed MRN: [id number] Date of Birth: [birth year] Referring Provider:  Mauricio Po, FNP  Encounter Date: 06/17/2014      PT End of Session - 06/17/14 1358    Visit Number 7   Number of Visits 16   Date for PT Re-Evaluation 07/18/14   PT Start Time 1332   PT Stop Time 9292   PT Time Calculation (min) 23 min   Activity Tolerance Patient tolerated treatment well      Past Medical History  Diagnosis Date  . Renal disorder renal failure    Past Surgical History  Procedure Laterality Date  . Nephrectomy transplanted organ      There were no vitals taken for this visit.  Visit Diagnosis:  Bilateral low back pain, with sciatica presence unspecified  Neck pain      Subjective Assessment - 06/17/14 1337    Symptoms Patient denies pain or N/T.   Pertinent History Kidney transplant, Hep C;HIV   Currently in Pain? No/denies   Pain Location Back   Pain Orientation Right;Left          [facility] PT Assessment - 06/17/14 1338    Observation/Other Assessments   Focus on Therapeutic Outcomes (FOTO)  43% last visit   AROM   Cervical Flexion 45   Cervical Extension 53   Cervical - Right Side Bend 35   Cervical - Left Side Bend 35   Cervical - Right Rotation 60   Cervical - Left Rotation 60   Lumbar Flexion 70   Lumbar Extension 40   Lumbar - Right Side Bend 35   Lumbar - Left Side Bend 35                  [facility] Adult PT Treatment/Exercise - 06/17/14 1354    Neck Exercises: Seated   Neck Retraction Other reps (comment)  Discussed for UE symptom control   Cervical Rotation Other reps (comment)   Lateral Flexion Other reps (comment)   Other Seated Exercise --  Discussed series of exercises if UE symptoms return                PT Education -  06/17/14 1358    Education provided Yes   Education Details Symptoms management with prone press ups and cervical retractions/extensions as needed;  discussed home over the door neck traction units for management of UE symptoms   Person(s) Educated Patient   Methods Explanation;Demonstration   Comprehension Verbalized understanding;Returned demonstration          PT Short Term Goals - 06/17/14 1346    PT SHORT TERM GOAL #1   Title "Independent with initial HEP   Status Achieved   PT SHORT TERM GOAL #2   Title "Demonstrate understanding of proper sitting posture and be more conscious of position and posture throughout the day.    Status Achieved   PT SHORT TERM GOAL #3   Title Left Cervical rotation improved to 40 degrees needed for turning head for driving.     Status Achieved   PT SHORT TERM GOAL #4   Title Overall pain level improved by 25% in back and neck per patient report.   Status Achieved           PT Long Term Goals - 06/17/14 1345    PT LONG TERM GOAL #  1   Title "Pt will be independent with advanced HEP.    Status Achieved   PT LONG TERM GOAL #2   Title Lumbar flexion ROM improved to 60 degrees and extension to 20 degrees needed for improved mobility getting in/out of the car.   Status Achieved   PT LONG TERM GOAL #3   Title FOTO functional outcome score improved from 55% limit to 32% indicating improved function with less pain.   Time 8   Period Weeks   Status Partially Met   PT LONG TERM GOAL #4   Status Achieved               Plan - 2014-07-02 1359    Clinical Impression Statement The patient has made excellent progress with lumbar and cervical AROM improvements.  Reports overall pain and function improved 80%.  The patient reports a good understanding of self management of the problem and the majority of goals have been met.     PT Next Visit Plan Discharge to independent self management          G-Codes - Jul 02, 2014 1402    Functional  Assessment Tool Used FOTO;clinical judgement   Functional Limitation Mobility: Walking and moving around   Mobility: Walking and Moving Around Current Status [phone]) At least 40 percent but less than 60 percent impaired, limited or restricted   Mobility: Walking and Moving Around Goal Status [phone]) At least 20 percent but less than 40 percent impaired, limited or restricted   Mobility: Walking and Moving Around Discharge Status [phone]) At least 40 percent but less than 60 percent impaired, limited or restricted      Problem List Patient Active Problem List   Diagnosis Date Noted  . Post concussive syndrome 05/13/2014  . KIDNEY TRANSPLANTATION 07/11/2007  . HEPATITIS C 03/15/2006  . SYPHILIS 03/15/2006  . HYPERTENSION 03/15/2006  . RENAL DISEASE, END STAGE 03/15/2006  . HYDROCELE NOS 03/15/2006  . SEIZURE DISORDER 03/15/2006  . ALCOHOL ABUSE, HX OF 03/15/2006  . HEPATITIS B, HX OF 03/15/2006  . TOBACCO USE, QUIT 03/15/2006  . PARATHYROIDECTOMY 08/08/2002  . CEREBROVASCULAR ACCIDENT, ACUTE 10/08/1999  . HIV INFECTION 05/10/1999    Alvera Singh 07-02-14, 2:04 PM  [facility] [address] [place], [place], [address] Phone: [phone]   Fax:  [phone]  PHYSICAL THERAPY DISCHARGE SUMMARY  Visits from Start of Care: 7  Current functional level related to goals / functional outcomes: See clinical impression above   Remaining deficits: Majority of goals met.  No pain today.  Patient expresses independence in HEP and readiness for discharge from PT.   Education / Equipment: HEP Plan: Patient agrees to discharge.  Patient goals were met. Patient is being discharged due to meeting the stated rehab goals.  ?????       Ruben Im, PT July 02, 2014 2:06 PM Phone: [phone] Fax: [phone]

## 2014-06-17 NOTE — Telephone Encounter (Signed)
per pt he has been d/c...td

## 2014-06-19 ENCOUNTER — Ambulatory Visit: Payer: Medicare Other | Admitting: Rehabilitation

## 2014-06-24 ENCOUNTER — Encounter: Payer: Medicare Other | Admitting: Rehabilitation

## 2014-06-26 ENCOUNTER — Encounter: Payer: Medicare Other | Admitting: Rehabilitation

## 2014-07-06 ENCOUNTER — Emergency Department (HOSPITAL_COMMUNITY)
Admission: EM | Admit: 2014-07-06 | Discharge: 2014-07-06 | Disposition: A | Discharge status: Home / Self Care | Payer: No Typology Code available for payment source | Attending: Emergency Medicine | Admitting: Emergency Medicine

## 2014-07-06 ENCOUNTER — Encounter (HOSPITAL_COMMUNITY): Payer: Self-pay | Admitting: Emergency Medicine

## 2014-07-06 DIAGNOSIS — Z79899 Other long term (current) drug therapy: Secondary | ICD-10-CM | POA: Diagnosis not present

## 2014-07-06 DIAGNOSIS — Z87448 Personal history of other diseases of urinary system: Secondary | ICD-10-CM | POA: Insufficient documentation

## 2014-07-06 DIAGNOSIS — Z7952 Long term (current) use of systemic steroids: Secondary | ICD-10-CM | POA: Diagnosis not present

## 2014-07-06 DIAGNOSIS — Z87828 Personal history of other (healed) physical injury and trauma: Secondary | ICD-10-CM | POA: Diagnosis not present

## 2014-07-06 DIAGNOSIS — Z87891 Personal history of nicotine dependence: Secondary | ICD-10-CM | POA: Diagnosis not present

## 2014-07-06 DIAGNOSIS — M25512 Pain in left shoulder: Secondary | ICD-10-CM | POA: Diagnosis present
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
## 2014-07-06 NOTE — ED Notes (Signed)
Declined W/C at D/C and was escorted to lobby by RN. 
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
## 2014-07-06 NOTE — Discharge Instructions (Signed)
Follow-up with your orthopedist regarding your shoulder pain.  Shoulder Pain The shoulder is the joint that connects your arms to your body. The bones that form the shoulder joint include the upper arm bone (humerus), the shoulder blade (scapula), and the collarbone (clavicle). The top of the humerus is shaped like a ball and fits into a rather flat socket on the scapula (glenoid cavity). A combination of muscles and strong, fibrous tissues that connect muscles to bones (tendons) support your shoulder joint and hold the ball in the socket. Small, fluid-filled sacs (bursae) are located in different areas of the joint. They act as cushions between the bones and the overlying soft tissues and help reduce friction between the gliding tendons and the bone as you move your arm. Your shoulder joint allows a wide range of motion in your arm. This range of motion allows you to do things like scratch your back or throw a ball. However, this range of motion also makes your shoulder more prone to pain from overuse and injury. Causes of shoulder pain can originate from both injury and overuse and usually can be grouped in the following four categories:  Redness, swelling, and pain (inflammation) of the tendon (tendinitis) or the bursae (bursitis).  Instability, such as a dislocation of the joint.  Inflammation of the joint (arthritis).  Broken bone (fracture). HOME CARE INSTRUCTIONS   Apply ice to the sore area.  Put ice in a plastic bag.  Place a towel between your skin and the bag.  Leave the ice on for 15-20 minutes, 3-4 times per day for the first 2 days, or as directed by your health care provider.  Stop using cold packs if they do not help with the pain.  If you have a shoulder sling or immobilizer, wear it as long as your caregiver instructs. Only remove it to shower or bathe. Move your arm as little as possible, but keep your hand moving to prevent swelling.  Squeeze a soft ball or foam pad as  much as possible to help prevent swelling.  Only take over-the-counter or prescription medicines for pain, discomfort, or fever as directed by your caregiver. SEEK MEDICAL CARE IF:   Your shoulder pain increases, or new pain develops in your arm, hand, or fingers.  Your hand or fingers become cold and numb.  Your pain is not relieved with medicines. SEEK IMMEDIATE MEDICAL CARE IF:   Your arm, hand, or fingers are numb or tingling.  Your arm, hand, or fingers are significantly swollen or turn white or blue. MAKE SURE YOU:   Understand these instructions.  Will watch your condition.  Will get help right away if you are not doing well or get worse. Document Released: 02/02/2005 Document Revised: 09/09/2013 Document Reviewed: 04/09/2011 [facility] Patient Information 2015 [place]. This information is not intended to replace advice given to you by your health care provider. Make sure you discuss any questions you have with your health care provider.

## 2014-07-14 ENCOUNTER — Ambulatory Visit (INDEPENDENT_AMBULATORY_CARE_PROVIDER_SITE_OTHER): Payer: Medicare Other | Admitting: Internal Medicine

## 2014-07-14 ENCOUNTER — Other Ambulatory Visit: Payer: Self-pay | Admitting: Internal Medicine

## 2014-07-14 VITALS — BP 129/75 | HR 79 | Temp 98.0°F | Wt 146.8 lb

## 2014-07-14 DIAGNOSIS — B20 Human immunodeficiency virus [HIV] disease: Secondary | ICD-10-CM

## 2014-07-14 DIAGNOSIS — B182 Chronic viral hepatitis C: Secondary | ICD-10-CM

## 2014-07-14 DIAGNOSIS — B1710 Acute hepatitis C without hepatic coma: Secondary | ICD-10-CM

## 2014-07-14 DIAGNOSIS — Z79899 Other long term (current) drug therapy: Secondary | ICD-10-CM

## 2014-07-14 LAB — CBC
HEMATOCRIT: 38 % — AB (ref 39.0–52.0)
HEMOGLOBIN: 13.1 g/dL (ref 13.0–17.0)
MCH: 38.9 pg — ABNORMAL HIGH (ref 26.0–34.0)
MCHC: 34.5 g/dL (ref 30.0–36.0)
MCV: 112.8 fL — ABNORMAL HIGH (ref 78.0–100.0)
MPV: 8.9 fL (ref 8.6–12.4)
Platelets: 195 10*3/uL (ref 150–400)
RBC: 3.37 MIL/uL — AB (ref 4.22–5.81)
RDW: 14.1 % (ref 11.5–15.5)
WBC: 4.9 10*3/uL (ref 4.0–10.5)

## 2014-07-14 LAB — COMPREHENSIVE METABOLIC PANEL
ALT: 23 U/L (ref 0–53)
AST: 20 U/L (ref 0–37)
Albumin: 4 g/dL (ref 3.5–5.2)
Alkaline Phosphatase: 70 U/L (ref 39–117)
BILIRUBIN TOTAL: 0.5 mg/dL (ref 0.2–1.2)
BUN: 25 mg/dL — ABNORMAL HIGH (ref 6–23)
CHLORIDE: 101 meq/L (ref 96–112)
CO2: 27 meq/L (ref 19–32)
Calcium: 6.9 mg/dL — ABNORMAL LOW (ref 8.4–10.5)
Creat: 1.65 mg/dL — ABNORMAL HIGH (ref 0.50–1.35)
GLUCOSE: 80 mg/dL (ref 70–99)
Potassium: 4.3 mEq/L (ref 3.5–5.3)
SODIUM: 136 meq/L (ref 135–145)
TOTAL PROTEIN: 6.9 g/dL (ref 6.0–8.3)

## 2014-07-14 LAB — LIPID PANEL
CHOLESTEROL: 152 mg/dL (ref 0–200)
HDL: 107 mg/dL (ref >=40)
LDL CALC: 21 mg/dL (ref 0–99)
Total CHOL/HDL Ratio: 1.4 Ratio
Triglycerides: 122 mg/dL (ref <150)
VLDL: 24 mg/dL (ref 0–40)

## 2014-07-14 LAB — IRON: Iron: 95 ug/dL (ref 42–165)
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
## 2014-07-15 LAB — RPR

## 2014-07-15 LAB — HEPATITIS C RNA QUANTITATIVE
HCV QUANT: 9705208 [IU]/mL — AB (ref <15)
HCV Quantitative Log: 6.99 {Log} — ABNORMAL HIGH (ref <1.18)

## 2014-07-15 LAB — ANA: ANA: NEGATIVE

## 2014-07-15 LAB — PROTIME-INR
INR: 1.11 (ref <1.50)
Prothrombin Time: 14.3 seconds (ref 11.6–15.2)

## 2014-07-16 LAB — HIV-1 RNA QUANT-NO REFLEX-BLD
HIV 1 RNA QUANT: 26 {copies}/mL — AB (ref <20)
HIV-1 RNA QUANT, LOG: 1.41 {Log} — AB (ref <1.30)

## 2014-07-16 LAB — T-HELPER CELL (CD4) - (RCID CLINIC ONLY)
CD4 T CELL ABS: 630 /uL (ref 400–2700)
CD4 T CELL HELPER: 34 % (ref 33–55)

## 2014-07-17 LAB — HEPATITIS C GENOTYPE

## 2014-07-18 ENCOUNTER — Encounter: Payer: Self-pay | Admitting: Neurology

## 2014-07-18 ENCOUNTER — Ambulatory Visit (INDEPENDENT_AMBULATORY_CARE_PROVIDER_SITE_OTHER): Payer: Medicare Other | Admitting: Neurology

## 2014-07-18 VITALS — BP 107/67 | HR 73 | Resp 14 | Ht 69.0 in | Wt 147.8 lb

## 2014-07-18 DIAGNOSIS — S069X1S Unspecified intracranial injury with loss of consciousness of 30 minutes or less, sequela: Secondary | ICD-10-CM

## 2014-07-18 DIAGNOSIS — S069X9A Unspecified intracranial injury with loss of consciousness of unspecified duration, initial encounter: Secondary | ICD-10-CM | POA: Insufficient documentation

## 2014-07-18 DIAGNOSIS — R412 Retrograde amnesia: Secondary | ICD-10-CM | POA: Diagnosis not present

## 2014-07-18 DIAGNOSIS — S069XAA Unspecified intracranial injury with loss of consciousness status unknown, initial encounter: Secondary | ICD-10-CM | POA: Insufficient documentation

## 2014-07-18 DIAGNOSIS — F418 Other specified anxiety disorders: Secondary | ICD-10-CM

## 2014-07-18 DIAGNOSIS — G4700 Insomnia, unspecified: Secondary | ICD-10-CM

## 2014-07-18 DIAGNOSIS — Z940 Kidney transplant status: Secondary | ICD-10-CM

## 2014-07-18 DIAGNOSIS — G4750 Parasomnia, unspecified: Secondary | ICD-10-CM

## 2014-07-18 DIAGNOSIS — G478 Other sleep disorders: Secondary | ICD-10-CM

## 2014-07-18 DIAGNOSIS — F5104 Psychophysiologic insomnia: Secondary | ICD-10-CM

## 2014-07-18 HISTORY — DX: Unspecified intracranial injury with loss of consciousness status unknown, initial encounter: S06.9XAA

## 2014-07-18 HISTORY — DX: Psychophysiologic insomnia: F51.04

## 2014-07-18 MED ORDER — LORAZEPAM 1 MG PO TABS: 1.0000 mg | ORAL_TABLET | Freq: Every day | ORAL | Status: DC
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
## 2014-07-19 LAB — LIVER FIBROSIS, FIBROTEST-ACTITEST
ALPHA-2-MACROGLOBULIN: 242 mg/dL (ref 106–279)
ALT: 22 U/L (ref 9–46)
Apolipoprotein A1: 208 mg/dL — ABNORMAL HIGH (ref 94–176)
BILIRUBIN: 0.4 mg/dL (ref 0.2–1.2)
Fibrosis Score: 0.53
GGT: 38 U/L (ref 3–85)
Necroinflammat ACT Score: 0.13
Reference ID: 1159327

## 2014-08-02 ENCOUNTER — Other Ambulatory Visit: Payer: Self-pay | Admitting: Internal Medicine

## 2014-08-04 ENCOUNTER — Telehealth: Payer: Self-pay | Admitting: Pharmacist Clinician (PhC)/ Clinical Pharmacy Specialist

## 2014-08-04 NOTE — Telephone Encounter (Signed)
Called Luis Reed to discuss with him about the approval of his Harvoni. We need to change his ART around to accommodate Harvoni. He was hesitant to change his ART previously but prob will do it now for his hep C treatment. Dr. Orvan Falconerampbell mentioned changing the ART to DTG + TRV.

## 2014-08-07 ENCOUNTER — Ambulatory Visit: Payer: Medicare Other | Admitting: Pharmacist Clinician (PhC)/ Clinical Pharmacy Specialist

## 2014-08-07 ENCOUNTER — Other Ambulatory Visit: Payer: Self-pay | Admitting: Pharmacist

## 2014-08-07 DIAGNOSIS — B20 Human immunodeficiency virus [HIV] disease: Secondary | ICD-10-CM

## 2014-08-07 DIAGNOSIS — B182 Chronic viral hepatitis C: Secondary | ICD-10-CM

## 2014-08-07 MED ORDER — DOLUTEGRAVIR SODIUM 50 MG PO TABS: 50.0000 mg | ORAL_TABLET | Freq: Every day | ORAL | Status: DC

## 2014-08-07 MED ORDER — EMTRICITABINE-TENOFOVIR DF 200-300 MG PO TABS: 1.0000 | ORAL_TABLET | ORAL | Status: DC

## 2014-08-07 MED ORDER — LEDIPASVIR-SOFOSBUVIR 90-400 MG PO TABS: 1.0000 | ORAL_TABLET | Freq: Every day | ORAL | Status: DC

## 2014-08-07 NOTE — Progress Notes (Signed)
HPI: Luis HagemanWalter L Reed is a 54 y.o. male who presents to the pharmacy clinic to discuss his Hep C/HIV medications.  Allergies: Allergies  Allergen Reactions  . Hydrocodone Itching and Other (See Comments)    fidgety    Vitals:    Past Medical History: Past Medical History  Diagnosis Date  . Renal disorder renal failure    Social History: History   Social History  . Marital Status: Married    Spouse Name: Steward DroneBrenda  . Number of Children: 2  . Years of Education: 12   Occupational History  . Disabled     Kidney disease    Social History Main Topics  . Smoking status: Former Smoker -- 0.30 packs/day for 20 years  . Smokeless tobacco: Never Used  . Alcohol Use: No  . Drug Use: No  . Sexual Activity:    Partners: Female     Comment: declined condoms   Other Topics Concern  . Not on file   Social History Narrative   [place] an house with his wife. No pets. Fun: fishing.    Denies religious beliefs that would effect health care.    Caffeine none.   Current Regimen: Sustiva, Combivir, Isentress  Labs: HIV 1 RNA QUANT (copies/mL)  Date Value  07/14/2014 26*  01/02/2014 <20  07/03/2013 <20   CD4 T CELL ABS (/uL)  Date Value  07/14/2014 630  01/02/2014 590  07/03/2013 680   HEP B S AB (no units)  Date Value  07/03/2006 Yes   HEPATITIS B SURFACE AG (no units)  Date Value  12/18/2006 NEGATIVE   HCV AB (no units)  Date Value  07/03/2006 Yes    CrCl: CrCl cannot be calculated (Unknown ideal weight.).  Lipids:    Component Value Date/Time   CHOL 152 07/14/2014 1617   TRIG 122 07/14/2014 1617   HDL 107 07/14/2014 1617   CHOLHDL 1.4 07/14/2014 1617   VLDL 24 07/14/2014 1617   LDLCALC 21 07/14/2014 1617    Assessment: 54 yo m co-infected with HIV/HepC who presents to the pharmacy clinic today to discuss his new Hep C medication, Harvoni, and to change his HIV medications.  He is currently taking  Sustiva, Isentress, and Combivir.  He does have a history of renal transplant, and he does have a CrCl of ~47 ml/min currently.  He is also taking Prograf and Cellcept. We explained drug interactions and side effects with starting Harvoni.  We will change him to Truvada every other day and Tivicay while he is on Harvoni. RTC in 1 month for follow-up labs.  Will send his new medications to the outpatient pharmacy today.  Recommendations: Discontinue Sustiva, Combivir, and Isentress Start Truvada every other day Start Tivicay once daily Start Harvoni once daily  Cassie L. Roseanne RenoStewart, PharmD Clinical Infectious Disease Pharmacist [facility] 08/07/2014, 9:26 AM

## 2014-08-14 ENCOUNTER — Encounter: Payer: Self-pay | Admitting: Internal Medicine

## 2014-08-14 ENCOUNTER — Ambulatory Visit (INDEPENDENT_AMBULATORY_CARE_PROVIDER_SITE_OTHER): Payer: Medicare Other | Admitting: Internal Medicine

## 2014-08-14 DIAGNOSIS — B20 Human immunodeficiency virus [HIV] disease: Secondary | ICD-10-CM

## 2014-08-14 LAB — BASIC METABOLIC PANEL
BUN: 16 mg/dL (ref 6–23)
CO2: 21 meq/L (ref 19–32)
CREATININE: 1.37 mg/dL — AB (ref 0.50–1.35)
Calcium: 6.5 mg/dL — ABNORMAL LOW (ref 8.4–10.5)
Chloride: 105 mEq/L (ref 96–112)
GLUCOSE: 83 mg/dL (ref 70–99)
Potassium: 4.7 mEq/L (ref 3.5–5.3)
Sodium: 139 mEq/L (ref 135–145)

## 2014-08-14 NOTE — Progress Notes (Signed)
Patient ID: Luis Reed, male   DOB: 01-Oct-1960, 54 y.o.   MRN: [id number]          Patient Active Problem List   Diagnosis Date Noted  . Other specified anxiety disorders 07/18/2014  . Chronic insomnia 07/18/2014  . RA (retrograde amnesia) 07/18/2014  . TBI (traumatic brain injury) 07/18/2014  . Renal transplant recipient 07/18/2014  . Sleep disorder due to a general medical condition, parasomnia type 07/18/2014  . Post concussive syndrome 05/13/2014  . KIDNEY TRANSPLANTATION 07/11/2007  . HEPATITIS C 03/15/2006  . SYPHILIS 03/15/2006  . HYPERTENSION 03/15/2006  . RENAL DISEASE, END STAGE 03/15/2006  . HYDROCELE NOS 03/15/2006  . SEIZURE DISORDER 03/15/2006  . ALCOHOL ABUSE, HX OF 03/15/2006  . HEPATITIS B, HX OF 03/15/2006  . TOBACCO USE, QUIT 03/15/2006  . PARATHYROIDECTOMY 08/08/2002  . CEREBROVASCULAR ACCIDENT, ACUTE 10/08/1999  . Human immunodeficiency virus (HIV) disease 05/10/1999    Patient's Medications  New Prescriptions   No medications on file  Previous Medications   ACETAMINOPHEN (TYLENOL) 325 MG TABLET    Take 1 tablet (325 mg total) by mouth every 6 (six) hours as needed for mild pain or moderate pain (pain).   CELLCEPT 250 MG CAPSULE    Take 250 mg by mouth 2 (two) times daily.   DOLUTEGRAVIR (TIVICAY) 50 MG TABLET    Take 1 tablet (50 mg total) by mouth daily.   EMTRICITABINE-TENOFOVIR (TRUVADA) 200-300 MG PER TABLET    Take 1 tablet by mouth every other day.   LATANOPROST (XALATAN) 0.005 % OPHTHALMIC SOLUTION       LEDIPASVIR-SOFOSBUVIR (HARVONI) 90-400 MG TABS    Take 1 tablet by mouth daily.   LORAZEPAM (ATIVAN) 1 MG TABLET    Take 1 tablet (1 mg total) by mouth at bedtime.   PREDNISONE (DELTASONE) 5 MG TABLET    Take 5 mg by mouth daily. Take on Monday, Wednesday and Friday nights   TACROLIMUS (PROGRAF) 1 MG CAPSULE    Take 1 mg by mouth 2 (two) times daily. Take with 5 mg tablet for a 6 mg dose   TACROLIMUS (PROGRAF) 5 MG CAPSULE    Take 5 mg by  mouth 2 (two) times daily. Take with 1 mg tablet for a 6 mg dose  Modified Medications   No medications on file  Discontinued Medications   No medications on file    Subjective: [name] is in for his routine follow-up visit. He has changed his HIV regimen to Truvada and Tivicay and is taking it correctly. He started Harvoni last week. He's had no problems tolerating his new medications and as usual he does not miss doses.   Past Medical History  Diagnosis Date  . Renal disorder renal failure    History  Substance Use Topics  . Smoking status: Former Smoker -- 0.30 packs/day for 20 years  . Smokeless tobacco: Never Used  . Alcohol Use: No    Family History  Problem Relation Age of Onset  . Prostate cancer Father     Allergies  Allergen Reactions  . Hydrocodone Itching and Other (See Comments)    fidgety    Objective: Temp: 98.1 F (36.7 C) (04/07 1038) Temp Source: Oral (04/07 1038) BP: 115/65 mmHg (04/07 1038) Pulse Rate: 69 (04/07 1038) Body mass index is 22.55 kg/(m^2).  General: he is well dressed and in good spirits Oral: no oropharyngeal lesions Skin: no rash Lungs: clear Cor:  Regular S1 and S2 with no murmurs Abdomen: soft  and nontender   Lab Results Lab Results  Component Value Date   WBC 4.9 07/14/2014   HGB 13.1 07/14/2014   HCT 38.0* 07/14/2014   MCV 112.8* 07/14/2014   PLT 195 07/14/2014    Lab Results  Component Value Date   CREATININE 1.65* 07/14/2014   BUN 25* 07/14/2014   NA 136 07/14/2014   K 4.3 07/14/2014   CL 101 07/14/2014   CO2 27 07/14/2014    Lab Results  Component Value Date   ALT 22 07/14/2014   AST 20 07/14/2014   ALKPHOS 70 07/14/2014   BILITOT 0.5 07/14/2014    Lab Results  Component Value Date   CHOL 152 07/14/2014   HDL 107 07/14/2014   LDLCALC 21 07/14/2014   TRIG 122 07/14/2014   CHOLHDL 1.4 07/14/2014    Lab Results HIV 1 RNA QUANT (copies/mL)  Date Value  07/14/2014 26*  01/02/2014 <20    07/03/2013 <20   CD4 T CELL ABS (/uL)  Date Value  07/14/2014 630  01/02/2014 590  07/03/2013 680     Assessment: He is off to a good start with his new antiretroviral regimen and hepatitis C therapy. I will repeat his creatinine today to make sure we are dosing his Truvada correctly.  Plan: 1. Continue current antiretroviral regimen for now 2. Continue Harvoni 3. BMP today  4.  repeat hepatitis C viral load in one month   Cliffton AstersJohn Ritchie Klee, MD [facility] [phone] pager   [phone] cell 08/14/2014, 11:05 AM

## 2014-08-15 ENCOUNTER — Other Ambulatory Visit: Payer: Self-pay | Admitting: Pharmacist Clinician (PhC)/ Clinical Pharmacy Specialist

## 2014-08-15 ENCOUNTER — Telehealth: Payer: Self-pay | Admitting: Pharmacist Clinician (PhC)/ Clinical Pharmacy Specialist

## 2014-08-15 MED ORDER — EMTRICITABINE-TENOFOVIR DF 200-300 MG PO TABS: 1.0000 | ORAL_TABLET | Freq: Every day | ORAL | Status: DC

## 2014-08-15 NOTE — Telephone Encounter (Signed)
Luis Reed bmet came back and his scr has improved to 1.37. The CrCl is now ~57 ml/min. Called Luis Reed to change his Truvada to qday now. He agreed to change it. New Rx has been changed and sent to outpt rx.

## 2014-09-05 ENCOUNTER — Ambulatory Visit (INDEPENDENT_AMBULATORY_CARE_PROVIDER_SITE_OTHER): Payer: Medicare Other | Admitting: Neurology

## 2014-09-05 VITALS — BP 123/73 | HR 70

## 2014-09-05 DIAGNOSIS — S069X1S Unspecified intracranial injury with loss of consciousness of 30 minutes or less, sequela: Secondary | ICD-10-CM

## 2014-09-05 DIAGNOSIS — G478 Other sleep disorders: Secondary | ICD-10-CM

## 2014-09-05 DIAGNOSIS — G4750 Parasomnia, unspecified: Secondary | ICD-10-CM

## 2014-09-05 DIAGNOSIS — I6789 Other cerebrovascular disease: Secondary | ICD-10-CM

## 2014-09-05 DIAGNOSIS — N186 End stage renal disease: Secondary | ICD-10-CM

## 2014-09-05 DIAGNOSIS — F5104 Psychophysiologic insomnia: Secondary | ICD-10-CM

## 2014-09-05 DIAGNOSIS — R412 Retrograde amnesia: Secondary | ICD-10-CM

## 2014-09-06 NOTE — Sleep Study (Signed)
Please see the scanned sleep study interpretation located in the Procedure tab within the Chart Review section. 

## 2014-09-08 ENCOUNTER — Other Ambulatory Visit: Payer: Medicare Other

## 2014-09-09 ENCOUNTER — Emergency Department (HOSPITAL_COMMUNITY)
Admission: EM | Admit: 2014-09-09 | Discharge: 2014-09-09 | Disposition: A | Discharge status: Home / Self Care | Payer: Medicare Other | Attending: Emergency Medicine | Admitting: Emergency Medicine

## 2014-09-09 ENCOUNTER — Encounter (HOSPITAL_COMMUNITY): Payer: Self-pay | Admitting: *Deleted

## 2014-09-09 ENCOUNTER — Other Ambulatory Visit: Payer: Medicare Other

## 2014-09-09 DIAGNOSIS — Z7952 Long term (current) use of systemic steroids: Secondary | ICD-10-CM | POA: Diagnosis not present

## 2014-09-09 DIAGNOSIS — R51 Headache: Secondary | ICD-10-CM | POA: Diagnosis not present

## 2014-09-09 DIAGNOSIS — J029 Acute pharyngitis, unspecified: Secondary | ICD-10-CM | POA: Diagnosis not present

## 2014-09-09 DIAGNOSIS — H9201 Otalgia, right ear: Secondary | ICD-10-CM

## 2014-09-09 DIAGNOSIS — R519 Headache, unspecified: Secondary | ICD-10-CM

## 2014-09-09 DIAGNOSIS — Z79899 Other long term (current) drug therapy: Secondary | ICD-10-CM | POA: Diagnosis not present

## 2014-09-09 DIAGNOSIS — Z87891 Personal history of nicotine dependence: Secondary | ICD-10-CM | POA: Insufficient documentation

## 2014-09-09 MED ORDER — CETIRIZINE HCL 10 MG PO TABS: 10.0000 mg | ORAL_TABLET | Freq: Every day | ORAL | Status: DC

## 2014-09-09 MED ORDER — AMOXICILLIN 500 MG PO CAPS: 500.0000 mg | ORAL_CAPSULE | Freq: Two times a day (BID) | ORAL | Status: DC

## 2014-09-09 NOTE — Discharge Instructions (Signed)
Otalgia °The most common reason for this in children is an infection of the middle ear. Pain from the middle ear is usually caused by a build-up of fluid and pressure behind the eardrum. Pain from an earache can be sharp, dull, or burning. The pain may be temporary or constant. The middle ear is connected to the nasal passages by a short narrow tube called the Eustachian tube. The Eustachian tube allows fluid to drain out of the middle ear, and helps keep the pressure in your ear equalized. °CAUSES  °A cold or allergy can block the Eustachian tube with inflammation and the build-up of secretions. This is especially likely in small children, because their Eustachian tube is shorter and more horizontal. When the Eustachian tube closes, the normal flow of fluid from the middle ear is stopped. Fluid can accumulate and cause stuffiness, pain, hearing loss, and an ear infection if germs start growing in this area. °SYMPTOMS  °The symptoms of an ear infection may include fever, ear pain, fussiness, increased crying, and irritability. Many children will have temporary and minor hearing loss during and right after an ear infection. Permanent hearing loss is rare, but the risk increases the more infections a child has. Other causes of ear pain include retained water in the outer ear canal from swimming and bathing. °Ear pain in adults is less likely to be from an ear infection. Ear pain may be referred from other locations. Referred pain may be from the joint between your jaw and the skull. It may also come from a tooth problem or problems in the neck. Other causes of ear pain include: °· A foreign body in the ear. °· Outer ear infection. °· Sinus infections. °· Impacted ear wax. °· Ear injury. °· Arthritis of the jaw or TMJ problems. °· Middle ear infection. °· Tooth infections. °· Sore throat with pain to the ears. °DIAGNOSIS  °Your caregiver can usually make the diagnosis by examining you. Sometimes other special studies,  including x-rays and lab work may be necessary. °TREATMENT  °· If antibiotics were prescribed, use them as directed and finish them even if you or your child's symptoms seem to be improved. °· Sometimes PE tubes are needed in children. These are little plastic tubes which are put into the eardrum during a simple surgical procedure. They allow fluid to drain easier and allow the pressure in the middle ear to equalize. This helps relieve the ear pain caused by pressure changes. °HOME CARE INSTRUCTIONS  °· Only take over-the-counter or prescription medicines for pain, discomfort, or fever as directed by your caregiver. DO NOT GIVE CHILDREN ASPIRIN because of the association of Reye's Syndrome in children taking aspirin. °· Use a cold pack applied to the outer ear for 15-20 minutes, 03-04 times per day or as needed may reduce pain. Do not apply ice directly to the skin. You may cause frost bite. °· Over-the-counter ear drops used as directed may be effective. Your caregiver may sometimes prescribe ear drops. °· Resting in an upright position may help reduce pressure in the middle ear and relieve pain. °· Ear pain caused by rapidly descending from high altitudes can be relieved by swallowing or chewing gum. Allowing infants to suck on a bottle during airplane travel can help. °· Do not smoke in the house or near children. If you are unable to quit smoking, smoke outside. °· Control allergies. °SEEK IMMEDIATE MEDICAL CARE IF:  °· You or your child are becoming sicker. °· Pain or fever   unable to quit smoking, smoke outside.  · Control allergies.  SEEK IMMEDIATE MEDICAL CARE IF:   · You or your child are becoming sicker.  · Pain or fever relief is not obtained with medicine.  · You or your child's symptoms (pain, fever, or irritability) do not improve within 24 to 48 hours or as instructed.  · Severe pain suddenly stops hurting. This may indicate a ruptured eardrum.  · You or your children develop new problems such as severe headaches, stiff neck, difficulty swallowing, or swelling of the face or around the ear.  Document Released: 12/11/2003 Document Revised: 07/18/2011  Document Reviewed: 04/16/2008  ExitCare® Patient Information ©2015 [employer]. This information is not intended to replace advice given to you by your health care provider. Make sure you discuss any questions you have with your health care provider.  Upper Respiratory Infection, Adult  An upper respiratory infection (URI) is also sometimes known as the common cold. The upper respiratory tract includes the nose, sinuses, throat, trachea, and bronchi. Bronchi are the airways leading to the lungs. Most people improve within 1 week, but symptoms can last up to 2 weeks. A residual cough may last even longer.   CAUSES  Many different viruses can infect the tissues lining the upper respiratory tract. The tissues become irritated and inflamed and often become very moist. Mucus production is also common. A cold is contagious. You can easily spread the virus to others by oral contact. This includes kissing, sharing a glass, coughing, or sneezing. Touching your mouth or nose and then touching a surface, which is then touched by another person, can also spread the virus.  SYMPTOMS   Symptoms typically develop 1 to 3 days after you come in contact with a cold virus. Symptoms vary from person to person. They may include:  · Runny nose.  · Sneezing.  · Nasal congestion.  · Sinus irritation.  · Sore throat.  · Loss of voice (laryngitis).  · Cough.  · Fatigue.  · Muscle aches.  · Loss of appetite.  · Headache.  · Low-grade fever.  DIAGNOSIS   You might diagnose your own cold based on familiar symptoms, since most people get a cold 2 to 3 times a year. Your caregiver can confirm this based on your exam. Most importantly, your caregiver can check that your symptoms are not due to another disease such as strep throat, sinusitis, pneumonia, asthma, or epiglottitis. Blood tests, throat tests, and X-rays are not necessary to diagnose a common cold, but they may sometimes be helpful in excluding other more serious diseases. Your  caregiver will decide if any further tests are required.  RISKS AND COMPLICATIONS   You may be at risk for a more severe case of the common cold if you smoke cigarettes, have chronic heart disease (such as heart failure) or lung disease (such as asthma), or if you have a weakened immune system. The very young and very old are also at risk for more serious infections. Bacterial sinusitis, middle ear infections, and bacterial pneumonia can complicate the common cold. The common cold can worsen asthma and chronic obstructive pulmonary disease (COPD). Sometimes, these complications can require emergency medical care and may be life-threatening.  PREVENTION   The best way to protect against getting a cold is to practice good hygiene. Avoid oral or hand contact with people with cold symptoms. Wash your hands often if contact occurs. There is no clear evidence that vitamin C, vitamin E, echinacea, or exercise   sugars, and fluids.  Breathing heated mist or steam (vaporizer or shower).  Eating chicken soup or other clear broths, and maintaining good nutrition.  Getting plenty of rest.  Using gargles or lozenges for comfort.  Controlling fevers with ibuprofen or acetaminophen as directed by your caregiver.  Increasing usage of your inhaler if you have asthma. Zinc gel and zinc lozenges, taken in the first 24 hours of the common cold, can shorten the duration and lessen the severity of symptoms. Pain medicines may help with fever, muscle aches, and throat pain. A variety of non-prescription medicines are available to treat congestion  and runny nose. Your caregiver can make recommendations and may suggest nasal or lung inhalers for other symptoms.  HOME CARE INSTRUCTIONS   Only take over-the-counter or prescription medicines for pain, discomfort, or fever as directed by your caregiver.  Use a warm mist humidifier or inhale steam from a shower to increase air moisture. This may keep secretions moist and make it easier to breathe.  Drink enough water and fluids to keep your urine clear or pale yellow.  Rest as needed.  Return to work when your temperature has returned to normal or as your caregiver advises. You may need to stay home longer to avoid infecting others. You can also use a face mask and careful hand washing to prevent spread of the virus. SEEK MEDICAL CARE IF:   After the first few days, you feel you are getting worse rather than better.  You need your caregiver's advice about medicines to control symptoms.  You develop chills, worsening shortness of breath, or brown or red sputum. These may be signs of pneumonia.  You develop yellow or brown nasal discharge or pain in the face, especially when you bend forward. These may be signs of sinusitis.  You develop a fever, swollen neck glands, pain with swallowing, or white areas in the back of your throat. These may be signs of strep throat. SEEK IMMEDIATE MEDICAL CARE IF:   You have a fever.  You develop severe or persistent headache, ear pain, sinus pain, or chest pain.  You develop wheezing, a prolonged cough, cough up blood, or have a change in your usual mucus (if you have chronic lung disease).  You develop sore muscles or a stiff neck. Document Released: 10/19/2000 Document Revised: 07/18/2011 Document Reviewed: 07/31/2013 [facility] Patient Information 2015 [place] [place]. This information is not intended to replace advice given to you by your health care provider. Make sure you discuss any questions you have with your health care provider.

## 2014-09-09 NOTE — ED Provider Notes (Signed)
CSN: [id number]     Arrival date & time 09/09/14  1611 History   First MD Initiated Contact with Patient 09/09/14 1714     Chief Complaint  Patient presents with  . Otalgia  . Sore Throat  . Cough     (Consider location/radiation/quality/duration/timing/severity/associated sxs/prior Treatment) HPI Comments: Patient presents to the emergency department with chief complaint of otalgia, sore throat, cough. Denies any productive sputum. States that the majority of his pain is in his ear. He also reports mild headache. He has not tried taking anything to alleviate his symptoms. Symptoms have been persistent for the past 2 days. He denies any fevers or chills. Patient's kidney transplant recipient, but states that everything is working normally. He is taking his medications as prescribed. He has no abdominal or urinary complaints.  The history is provided by the patient. No language interpreter was used.    Past Medical History  Diagnosis Date  . Renal disorder renal failure   Past Surgical History  Procedure Laterality Date  . Nephrectomy transplanted organ     Family History  Problem Relation Age of Onset  . Prostate cancer Father    History  Substance Use Topics  . Smoking status: Former Smoker -- 0.30 packs/day for 20 years  . Smokeless tobacco: Never Used  . Alcohol Use: No    Review of Systems  Constitutional: Negative for fever and chills.  Respiratory: Negative for shortness of breath.   Cardiovascular: Negative for chest pain.  Gastrointestinal: Negative for nausea, vomiting, diarrhea and constipation.  Genitourinary: Negative for dysuria.  All other systems reviewed and are negative.     Allergies  Hydrocodone  Home Medications   Prior to Admission medications   Medication Sig Start Date End Date Taking? Authorizing Provider  acetaminophen (TYLENOL) 325 MG tablet Take 1 tablet (325 mg total) by mouth every 6 (six) hours as needed for mild pain or moderate pain  (pain). 04/12/13   Jennifer Piepenbrink, PA-C  amoxicillin (AMOXIL) 500 MG capsule Take 1 capsule (500 mg total) by mouth 2 (two) times daily. 09/09/14   Roxy Horseman, PA-C  CELLCEPT 250 MG capsule Take 250 mg by mouth 2 (two) times daily. 01/29/14   Historical Provider, MD  cetirizine (ZYRTEC ALLERGY) 10 MG tablet Take 1 tablet (10 mg total) by mouth daily. 09/09/14   Roxy Horseman, PA-C  dolutegravir (TIVICAY) 50 MG tablet Take 1 tablet (50 mg total) by mouth daily. 08/07/14   Cliffton Asters, MD  emtricitabine-tenofovir (TRUVADA) 200-300 MG per tablet Take 1 tablet by mouth daily. 08/15/14   Cliffton Asters, MD  latanoprost (XALATAN) 0.005 % ophthalmic solution  06/03/14   Historical Provider, MD  Ledipasvir-Sofosbuvir (HARVONI) 90-400 MG TABS Take 1 tablet by mouth daily. 08/07/14   Cliffton Asters, MD  LORazepam (ATIVAN) 1 MG tablet Take 1 tablet (1 mg total) by mouth at bedtime. 07/18/14   Porfirio Mylar Dohmeier, MD  predniSONE (DELTASONE) 5 MG tablet Take 5 mg by mouth daily. Take on Monday, Wednesday and Friday nights    Historical Provider, MD  tacrolimus (PROGRAF) 1 MG capsule Take 1 mg by mouth 2 (two) times daily. Take with 5 mg tablet for a 6 mg dose    Historical Provider, MD  tacrolimus (PROGRAF) 5 MG capsule Take 5 mg by mouth 2 (two) times daily. Take with 1 mg tablet for a 6 mg dose    Historical Provider, MD   BP 141/61 mmHg  Pulse 76  Temp(Src) 98.8 F (37.1 C) (Oral)  Resp 18  Ht 5\' 8"  (1.727 m)  Wt 150 lb (68.04 kg)  BMI 22.81 kg/m2  SpO2 96% Physical Exam  Constitutional: He is oriented to person, place, and time. He appears well-developed and well-nourished.  HENT:  Head: Normocephalic and atraumatic.  Mild congestion seen behind right tympanic membrane, left tympanic membrane is normal Oropharynx is clear Poor dentition throughout No evidence of abscess (peritonsillar or tonsillar or gingival) No stridor Airway intact  Eyes: Conjunctivae and EOM are normal. Pupils are equal,  round, and reactive to light. Right eye exhibits no discharge. Left eye exhibits no discharge. No scleral icterus.  Neck: Normal range of motion. Neck supple. No JVD present.  Cardiovascular: Normal rate, regular rhythm and normal heart sounds.  Exam reveals no gallop and no friction rub.   No murmur heard. Pulmonary/Chest: Effort normal and breath sounds normal. No respiratory distress. He has no wheezes. He has no rales. He exhibits no tenderness.  Abdominal: Soft. He exhibits no distension and no mass. There is no tenderness. There is no rebound and no guarding.  Musculoskeletal: Normal range of motion. He exhibits no edema or tenderness.  Neurological: He is alert and oriented to person, place, and time.  Skin: Skin is warm and dry.  Psychiatric: He has a normal mood and affect. His behavior is normal. Judgment and thought content normal.  Nursing note and vitals reviewed.   ED Course  Procedures (including critical care time) Labs Review Labs Reviewed - No data to display  Imaging Review No results found.   EKG Interpretation None      MDM   Final diagnoses:  Ear ache, right  Sore throat  Headache, unspecified headache type    Patient with otalgia, could be otitis, there is some slight fluid collection behind right tympanic membrane. Additionally patient has poor dentition throughout, and he could be having referred pain from his mouth. His way, I feel that he would benefit from some amoxicillin. Patient is afebrile. He has not had a productive cough. Lungs are clear to auscultation. Will discharge with amoxicillin and Zyrtec, and recommend follow-up with primary care. Care plan discussed with Dr. Karma GanjaLinker, who agrees with the plan.    Roxy Horsemanobert Camber Ninh, PA-C 09/09/14 1820  Jerelyn ScottMartha Linker, MD 09/09/14 [phone]

## 2014-09-09 NOTE — ED Notes (Signed)
Pt states that he has had a headache, ear pain, sore throat, and cough for 2 days.

## 2014-09-09 NOTE — ED Notes (Signed)
Pt is a Kidney transplant recipient

## 2014-09-25 ENCOUNTER — Telehealth: Payer: Self-pay

## 2014-09-25 ENCOUNTER — Other Ambulatory Visit: Payer: Medicare Other

## 2014-09-25 DIAGNOSIS — B182 Chronic viral hepatitis C: Secondary | ICD-10-CM

## 2014-09-25 DIAGNOSIS — B20 Human immunodeficiency virus [HIV] disease: Secondary | ICD-10-CM

## 2014-09-25 NOTE — Telephone Encounter (Signed)
Spoke to pt to give sleep study results. Informed pt that no osa was seen during study but PLMS with associated sleep disruption was found. Advised positional therapy. Pt wished to make a f/u appt to discuss treatment options for the PLMS. A f/u appt was made on 10/20/14. Encouraged pt to call back with any questions or concerns.

## 2014-09-26 LAB — HEPATITIS C RNA QUANTITATIVE: HCV QUANT: NOT DETECTED [IU]/mL (ref <15)

## 2014-10-05 ENCOUNTER — Other Ambulatory Visit: Payer: Self-pay | Admitting: Internal Medicine

## 2014-10-09 ENCOUNTER — Ambulatory Visit: Payer: Medicare Other | Admitting: Internal Medicine

## 2014-10-20 ENCOUNTER — Ambulatory Visit (INDEPENDENT_AMBULATORY_CARE_PROVIDER_SITE_OTHER): Payer: Medicare Other | Admitting: Neurology

## 2014-10-20 ENCOUNTER — Encounter: Payer: Self-pay | Admitting: Neurology

## 2014-10-20 VITALS — BP 116/76 | HR 78 | Resp 20 | Ht 69.69 in | Wt 152.0 lb

## 2014-10-20 DIAGNOSIS — G4700 Insomnia, unspecified: Secondary | ICD-10-CM | POA: Insufficient documentation
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
## 2014-11-24 ENCOUNTER — Ambulatory Visit (INDEPENDENT_AMBULATORY_CARE_PROVIDER_SITE_OTHER): Payer: Medicare Other | Admitting: Internal Medicine

## 2014-11-24 DIAGNOSIS — B182 Chronic viral hepatitis C: Secondary | ICD-10-CM | POA: Diagnosis not present

## 2014-11-24 DIAGNOSIS — B20 Human immunodeficiency virus [HIV] disease: Secondary | ICD-10-CM | POA: Diagnosis not present

## 2014-11-24 NOTE — Progress Notes (Signed)
Patient ID: Luis Reed, male   DOB: 1960/08/11, 54 y.o.   MRN: [id number]          Patient Active Problem List   Diagnosis Date Noted  . Insomnia disorder, with non-sleep disorder mental comorbidity 10/20/2014  . Other specified anxiety disorders 07/18/2014  . Chronic insomnia 07/18/2014  . RA (retrograde amnesia) 07/18/2014  . TBI (traumatic brain injury) 07/18/2014  . Renal transplant recipient 07/18/2014  . Sleep disorder due to a general medical condition, parasomnia type 07/18/2014  . Post concussive syndrome 05/13/2014  . 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 H/O kidney transplant 06/06/2011  . Essential (primary) hypertension 06/06/2011  . Carrier of viral hepatitis c 06/06/2011  . H/O disease 06/06/2011  . HIV positive 06/06/2011  . KIDNEY TRANSPLANTATION 07/11/2007  . Hepatitis C virus infection without hepatic coma 03/15/2006  . SYPHILIS 03/15/2006  . HYPERTENSION 03/15/2006  . RENAL DISEASE, END STAGE 03/15/2006  . HYDROCELE NOS 03/15/2006  . SEIZURE DISORDER 03/15/2006  . ALCOHOL ABUSE, HX OF 03/15/2006  . HEPATITIS B, HX OF 03/15/2006  . TOBACCO USE, QUIT 03/15/2006  . PARATHYROIDECTOMY 08/08/2002  . CEREBROVASCULAR ACCIDENT, ACUTE 10/08/1999  . Human immunodeficiency virus (HIV) disease 05/10/1999    Patient's Medications  New Prescriptions   No medications on file  Previous Medications   ACETAMINOPHEN (TYLENOL) 325 MG TABLET    Take 1 tablet (325 mg total) by mouth every 6 (six) hours as needed for mild pain or moderate pain (pain).   AMOXICILLIN (AMOXIL) 500 MG CAPSULE    Take 1 capsule (500 mg total) by mouth 2 (two) times daily.   CELLCEPT 250 MG CAPSULE    Take 250 mg by mouth 2 (two) times daily.   CETIRIZINE (ZYRTEC ALLERGY) 10 MG TABLET    Take 1 tablet (10 mg total) by mouth daily.   DOLUTEGRAVIR (TIVICAY) 50 MG TABLET    Take 1 tablet (50 mg total) by mouth daily.   EMTRICITABINE-TENOFOVIR (TRUVADA) 200-300 MG PER TABLET    Take 1  tablet by mouth daily.   LATANOPROST (XALATAN) 0.005 % OPHTHALMIC SOLUTION       LEDIPASVIR-SOFOSBUVIR (HARVONI) 90-400 MG TABS    Take 1 tablet by mouth daily.   LORAZEPAM (ATIVAN) 1 MG TABLET    Take 1 tablet (1 mg total) by mouth at bedtime.   PREDNISONE (DELTASONE) 5 MG TABLET    Take 5 mg by mouth daily. Take on Monday, Wednesday and Friday nights   TACROLIMUS (PROGRAF) 1 MG CAPSULE    Take 1 mg by mouth 2 (two) times daily. Take with 5 mg tablet for a 6 mg dose   TACROLIMUS (PROGRAF) 5 MG CAPSULE    Take 5 mg by mouth 2 (two) times daily. Take with 1 mg tablet for a 6 mg dose  Modified Medications   No medications on file  Discontinued Medications   No medications on file    Subjective: [name] is in for his routine visit. He recently completed 3 months of Harvoni without difficulty. He continues to take Truvada and Tivicay and never misses doses. He believes that his sleep did improve when he changed his anti-retroviral regimen and stop taking Sustiva several months ago.  Review of Systems: Pertinent items are noted in HPI.  Past Medical History  Diagnosis Date  . Renal disorder renal failure    History  Substance Use Topics  . Smoking status: Former Smoker -- 0.30 packs/day for 20 years  . Smokeless tobacco:  Never Used  . Alcohol Use: No    Family History  Problem Relation Age of Onset  . Prostate cancer Father     Allergies  Allergen Reactions  . Hydrocodone Itching and Other (See Comments)    fidgety    Objective: Temp: 98.1 F (36.7 C) (07/18 0912) Temp Source: Oral (07/18 0912) BP: 119/82 mmHg (07/18 0912) Pulse Rate: 64 (07/18 0912) Body mass index is 23.12 kg/(m^2).  General: Alert and comfortable Oral: No oropharyngeal lesions Skin: No rash Lungs: Clear Cor: Regular S1 and S2 with no murmurs  Lab Results Lab Results  Component Value Date   WBC 4.9 07/14/2014   HGB 13.1 07/14/2014   HCT 38.0* 07/14/2014   MCV 112.8* 07/14/2014   PLT 195  07/14/2014    Lab Results  Component Value Date   CREATININE 1.37* 08/14/2014   BUN 16 08/14/2014   NA 139 08/14/2014   K 4.7 08/14/2014   CL 105 08/14/2014   CO2 21 08/14/2014    Lab Results  Component Value Date   ALT 22 07/14/2014   AST 20 07/14/2014   ALKPHOS 70 07/14/2014   BILITOT 0.5 07/14/2014    Lab Results  Component Value Date   CHOL 152 07/14/2014   HDL 107 07/14/2014   LDLCALC 21 07/14/2014   TRIG 122 07/14/2014   CHOLHDL 1.4 07/14/2014    Lab Results HIV 1 RNA QUANT (copies/mL)  Date Value  07/14/2014 26*  01/02/2014 <20  07/03/2013 <20   CD4 T CELL ABS (/uL)  Date Value  07/14/2014 630  01/02/2014 590  07/03/2013 680   Hepatitis C viral load 07/14/2014: 1,610,9609,705,208 Hepatitis C viral load 09/25/2014: Not detected   Assessment: His HIV remains under very good control. I will change Truvada to Descovy and continue Tivicay.  I suspect that his hepatitis C has been cured.  Plan: 1. Change Truvada to Descovy and continue Tivicay 2. Check CD4 count, HIV viral load and hepatitis C viral load 3. Follow-up in 6 months   Cliffton AstersJohn Daronte Shostak, MD [facility] [phone] pager   [phone] cell 11/24/2014, 9:24 AM

## 2014-11-25 LAB — T-HELPER CELL (CD4) - (RCID CLINIC ONLY)
CD4 % Helper T Cell: 28 % — ABNORMAL LOW (ref 33–55)
CD4 T Cell Abs: 450 /uL (ref 400–2700)

## 2014-11-25 LAB — HEPATITIS C RNA QUANTITATIVE: HCV Quantitative: NOT DETECTED IU/mL (ref <15)

## 2014-11-25 LAB — HIV-1 RNA QUANT-NO REFLEX-BLD
HIV 1 RNA Quant: <20 copies/mL (ref <20)
HIV-1 RNA Quant, Log: <1.3 {Log} (ref <1.30)

## 2015-02-16 ENCOUNTER — Telehealth: Payer: Self-pay | Admitting: *Deleted

## 2015-02-16 NOTE — Telephone Encounter (Signed)
Patient called, stating that he was told by the pharmacy that his insurance is not working, he cannot refill his medication. RN contacted pharmacy - [facility].  Confirmed insurance information, it is not active.  RN attempted to contact patient to ask him to call his insurance company to find out why he has lost coverage.  RN left voicemail with instructions to call back with this information, but also to call and get connected to [facility] in Patient assistance.  Patient returned the call, RN relayed the information to him.  He will call with more information after speaking with his insurance carrier. Andree Coss, RN

## 2015-02-17 ENCOUNTER — Telehealth: Payer: Self-pay | Admitting: *Deleted

## 2015-02-17 NOTE — Telephone Encounter (Signed)
Patient called stating he lost his Medicare Part D and he has since signed up for a new plan. However, it will not be active until 03/10/15 and he was wondering if we had any samples. Spoke to Dr. Orvan Falconer and he suggested patient assistance. I will speak with [name] and see if there is a plan to cover someone with Medicare. He know to expect a call from her in the morning. Luis Reed

## 2015-02-18 NOTE — Telephone Encounter (Signed)
There is one program through [employer] that will help with truvada, but it would still cost $47.58 per month up front. There is not a program for Tivicay, the only one that would benefit him requires part D coverage.

## 2015-02-19 NOTE — Telephone Encounter (Signed)
Pt is concerned that he will be out of medication soon.  RN spoke with Domenica FailBetty, [facility], to explore options for the pt to receive his medications and [facility].

## 2015-03-23 ENCOUNTER — Other Ambulatory Visit (INDEPENDENT_AMBULATORY_CARE_PROVIDER_SITE_OTHER): Payer: Medicare Other

## 2015-03-23 ENCOUNTER — Encounter: Payer: Self-pay | Admitting: Family

## 2015-03-23 ENCOUNTER — Ambulatory Visit (INDEPENDENT_AMBULATORY_CARE_PROVIDER_SITE_OTHER): Payer: Medicare Other | Admitting: Family

## 2015-03-23 VITALS — BP 120/70 | HR 63 | Temp 98.0°F | Resp 18 | Ht 68.0 in | Wt 159.8 lb

## 2015-03-23 DIAGNOSIS — Z0000 Encounter for general adult medical examination without abnormal findings: Secondary | ICD-10-CM | POA: Diagnosis not present

## 2015-03-23 DIAGNOSIS — I10 Essential (primary) hypertension: Secondary | ICD-10-CM

## 2015-03-23 DIAGNOSIS — Z125 Encounter for screening for malignant neoplasm of prostate: Secondary | ICD-10-CM

## 2015-03-23 LAB — COMPREHENSIVE METABOLIC PANEL
ALT: 10 U/L (ref 0–53)
AST: 16 U/L (ref 0–37)
Albumin: 4.3 g/dL (ref 3.5–5.2)
Alkaline Phosphatase: 78 U/L (ref 39–117)
BUN: 19 mg/dL (ref 6–23)
CALCIUM: 7.7 mg/dL — AB (ref 8.4–10.5)
CHLORIDE: 103 meq/L (ref 96–112)
CO2: 27 meq/L (ref 19–32)
Creatinine, Ser: 1.91 mg/dL — ABNORMAL HIGH (ref 0.40–1.50)
GFR: 47.1 mL/min — AB (ref >60.00)
Glucose, Bld: 97 mg/dL (ref 70–99)
Potassium: 4.7 mEq/L (ref 3.5–5.1)
Sodium: 140 mEq/L (ref 135–145)
Total Bilirubin: 0.7 mg/dL (ref 0.2–1.2)
Total Protein: 7.6 g/dL (ref 6.0–8.3)

## 2015-03-23 LAB — LIPID PANEL
CHOLESTEROL: 128 mg/dL (ref 0–200)
HDL: 62.9 mg/dL (ref >39.00)
LDL Cholesterol: 54 mg/dL (ref 0–99)
NonHDL: 64.64
Total CHOL/HDL Ratio: 2
Triglycerides: 52 mg/dL (ref 0.0–149.0)
VLDL: 10.4 mg/dL (ref 0.0–40.0)

## 2015-03-23 LAB — CBC
HCT: 45.7 % (ref 39.0–52.0)
HEMOGLOBIN: 15.1 g/dL (ref 13.0–17.0)
MCHC: 33.1 g/dL (ref 30.0–36.0)
MCV: 97.9 fl (ref 78.0–100.0)
PLATELETS: 193 10*3/uL (ref 150.0–400.0)
RBC: 4.67 Mil/uL (ref 4.22–5.81)
RDW: 12.8 % (ref 11.5–15.5)
WBC: 6.1 10*3/uL (ref 4.0–10.5)

## 2015-03-23 LAB — PSA: PSA: 0.46 ng/mL (ref 0.10–4.00)

## 2015-03-23 LAB — TSH: TSH: 0.51 u[IU]/mL (ref 0.35–4.50)

## 2015-03-23 NOTE — Assessment & Plan Note (Signed)
Reviewed and updated patient's medical, surgical, family and social history. Medications and allergies were also reviewed. Basic screenings for depression, activities of daily living, hearing, cognition and safety were performed. Provider list was updated and health plan was provided to the patient.  

## 2015-03-23 NOTE — Patient Instructions (Addendum)
Thank you for choosing [facility].  Summary/Instructions:  Please stop by the lab on the basement level of the building for your blood work. Your results will be released to MyChart (or called to you) after review, usually within 72 hours after test completion. If any changes need to be made, you will be notified at that same time.   Health Maintenance, Male A healthy lifestyle and preventative care can promote health and wellness.  Maintain regular health, dental, and eye exams.  Eat a healthy diet. Foods like vegetables, fruits, whole grains, low-fat dairy products, and lean protein foods contain the nutrients you need and are low in calories. Decrease your intake of foods high in solid fats, added sugars, and salt. Get information about a proper diet from your health care provider, if necessary.  Regular physical exercise is one of the most important things you can do for your health. Most adults should get at least 150 minutes of moderate-intensity exercise (any activity that increases your heart rate and causes you to sweat) each week. In addition, most adults need muscle-strengthening exercises on 2 or more days a week.   Maintain a healthy weight. The body mass index (BMI) is a screening tool to identify possible weight problems. It provides an estimate of body fat based on height and weight. Your health care provider can find your BMI and can help you achieve or maintain a healthy weight. For males 20 years and older:  A BMI below 18.5 is considered underweight.  A BMI of 18.5 to 24.9 is normal.  A BMI of 25 to 29.9 is considered overweight.  A BMI of 30 and above is considered obese.  Maintain normal blood lipids and cholesterol by exercising and minimizing your intake of saturated fat. Eat a balanced diet with plenty of fruits and vegetables. Blood tests for lipids and cholesterol should begin at age 56 and be repeated every 5 years. If your lipid or cholesterol levels  are high, you are over age 56, or you are at high risk for heart disease, you may need your cholesterol levels checked more frequently.Ongoing high lipid and cholesterol levels should be treated with medicines if diet and exercise are not working.  If you smoke, find out from your health care provider how to quit. If you do not use tobacco, do not start.  Lung cancer screening is recommended for adults aged 55-80 years who are at high risk for developing lung cancer because of a history of smoking. A yearly low-dose CT scan of the lungs is recommended for people who have at least a 30-pack-year history of smoking and are current smokers or have quit within the past 15 years. A pack year of smoking is smoking an average of 1 pack of cigarettes a day for 1 year (for example, a 30-pack-year history of smoking could mean smoking 1 pack a day for 30 years or 2 packs a day for 15 years). Yearly screening should continue until the smoker has stopped smoking for at least 15 years. Yearly screening should be stopped for people who develop a health problem that would prevent them from having lung cancer treatment.  If you choose to drink alcohol, do not have more than 2 drinks per day. One drink is considered to be 12 oz (360 mL) of beer, 5 oz (150 mL) of wine, or 1.5 oz (45 mL) of liquor.  Avoid the use of street drugs. Do not share needles with anyone. Ask for help if you  need support or instructions about stopping the use of drugs.  High blood pressure causes heart disease and increases the risk of stroke. High blood pressure is more likely to develop in:  People who have blood pressure in the end of the normal range (100-139/85-89 mm Hg).  People who are overweight or obese.  People who are African American.  If you are 16-2 years of age, have your blood pressure checked every 3-5 years. If you are 32 years of age or older, have your blood pressure checked every year. You should have your blood  pressure measured twice--once when you are at a hospital or clinic, and once when you are not at a hospital or clinic. Record the average of the two measurements. To check your blood pressure when you are not at a hospital or clinic, you can use:  An automated blood pressure machine at a pharmacy.  A home blood pressure monitor.  If you are 21-17 years old, ask your health care provider if you should take aspirin to prevent heart disease.  Diabetes screening involves taking a blood sample to check your fasting blood sugar level. This should be done once every 3 years after age 67 if you are at a normal weight and without risk factors for diabetes. Testing should be considered at a younger age or be carried out more frequently if you are overweight and have at least 1 risk factor for diabetes.  Colorectal cancer can be detected and often prevented. Most routine colorectal cancer screening begins at the age of 65 and continues through age 22. However, your health care provider may recommend screening at an earlier age if you have risk factors for colon cancer. On a yearly basis, your health care provider may provide home test kits to check for hidden blood in the stool. A small camera at the end of a tube may be used to directly examine the colon (sigmoidoscopy or colonoscopy) to detect the earliest forms of colorectal cancer. Talk to your health care provider about this at age 90 when routine screening begins. A direct exam of the colon should be repeated every 5-10 years through age 54, unless early forms of precancerous polyps or small growths are found.  People who are at an increased risk for hepatitis B should be screened for this virus. You are considered at high risk for hepatitis B if:  You were born in a country where hepatitis B occurs often. Talk with your health care provider about which countries are considered high risk.  Your parents were born in a high-risk country and you have not  received a shot to protect against hepatitis B (hepatitis B vaccine).  You have HIV or AIDS.  You use needles to inject street drugs.  You live with, or have sex with, someone who has hepatitis B.  You are a man who has sex with other men (MSM).  You get hemodialysis treatment.  You take certain medicines for conditions like cancer, organ transplantation, and autoimmune conditions.  Hepatitis C blood testing is recommended for all people born from [birth year] through 1965 and any individual with known risk factors for hepatitis C.  Healthy men should no longer receive prostate-specific antigen (PSA) blood tests as part of routine cancer screening. Talk to your health care provider about prostate cancer screening.  Testicular cancer screening is not recommended for adolescents or adult males who have no symptoms. Screening includes self-exam, a health care provider exam, and other screening tests. Consult with  your health care provider about any symptoms you have or any concerns you have about testicular cancer.  Practice safe sex. Use condoms and avoid high-risk sexual practices to reduce the spread of sexually transmitted infections (STIs).  You should be screened for STIs, including gonorrhea and chlamydia if:  You are sexually active and are younger than 24 years.  You are older than 24 years, and your health care provider tells you that you are at risk for this type of infection.  Your sexual activity has changed since you were last screened, and you are at an increased risk for chlamydia or gonorrhea. Ask your health care provider if you are at risk.  If you are at risk of being infected with HIV, it is recommended that you take a prescription medicine daily to prevent HIV infection. This is called pre-exposure prophylaxis (PrEP). You are considered at risk if:  You are a man who has sex with other men (MSM).  You are a heterosexual man who is sexually active with multiple  partners.  You take drugs by injection.  You are sexually active with a partner who has HIV.  Talk with your health care provider about whether you are at high risk of being infected with HIV. If you choose to begin PrEP, you should first be tested for HIV. You should then be tested every 3 months for as long as you are taking PrEP.  Use sunscreen. Apply sunscreen liberally and repeatedly throughout the day. You should seek shade when your shadow is shorter than you. Protect yourself by wearing long sleeves, pants, a wide-brimmed hat, and sunglasses year round whenever you are outdoors.  Tell your health care provider of new moles or changes in moles, especially if there is a change in shape or color. Also, tell your health care provider if a mole is larger than the size of a pencil eraser.  A one-time screening for abdominal aortic aneurysm (AAA) and surgical repair of large AAAs by ultrasound is recommended for men aged 65-75 years who are current or former smokers.  Stay current with your vaccines (immunizations).   This information is not intended to replace advice given to you by your health care provider. Make sure you discuss any questions you have with your health care provider.   Document Released: 10/22/2007 Document Revised: 05/16/2014 Document Reviewed: 09/20/2010 Elsevier Interactive Patient Education 2016 [employer].   Advance Directive Advance directives are the legal documents that allow you to make choices about your health care and medical treatment if you cannot speak for yourself. Advance directives are a way for you to communicate your wishes to family, friends, and health care providers. The specified people can then convey your decisions about end-of-life care to avoid confusion if you should become unable to communicate. Ideally, the process of discussing and writing advance directives should happen over time rather than making decisions all at once. Advance  directives can be modified as your situation changes, and you can change your mind at any time, even after you have signed the advance directives. Each state has its own laws regarding advance directives. You may want to check with your health care provider, attorney, or state representative about the law in your state. Below are some examples of advance directives. LIVING WILL A living will is a set of instructions documenting your wishes about medical care when you cannot care for yourself. It is used if you become:  Terminally ill.  Incapacitated.  Unable to communicate.  Unable to make decisions. Items to consider in your living will include:  The use or non-use of life-sustaining equipment, such as dialysis machines and breathing machines (ventilators).  A do not resuscitate (DNR) order, which is the instruction not to use cardiopulmonary resuscitation (CPR) if breathing or heartbeat stops.  Tube feeding.  Withholding of food and fluids.  Comfort (palliative) care when the goal becomes comfort rather than a cure.  Organ and tissue donation. A living will does not give instructions about distribution of your money and property if you should pass away. It is advisable to seek the expert advice of a lawyer in drawing up a will regarding your possessions. Decisions about taxes, beneficiaries, and asset distribution will be legally binding. This process can relieve your family and friends of any burdens surrounding disputes or questions that may come up about the allocation of your assets. DO NOT RESUSCITATE (DNR) A do not resuscitate (DNR) order is a request to not have CPR in the event that your heart stops beating or you stop breathing. Unless given other instructions, a health care provider will try to help any patient whose heart has stopped or who has stopped breathing.  HEALTH CARE PROXY AND DURABLE POWER OF ATTORNEY FOR HEALTH CARE A health care proxy is a person (agent)  appointed to make medical decisions for you if you cannot. Generally, people choose someone they know well and trust to represent their preferences when they can no longer do so. You should be sure to ask this person for agreement to act as your agent. An agent may have to exercise judgment in the event of a medical decision for which your wishes are not known. The durable power of attorney for health care is the legal document that names your health care proxy. Once written, it should be:  Signed.  Notarized.  Dated.  Copied.  Witnessed.  Incorporated into your medical record. You may also want to appoint someone to manage your financial affairs if you cannot. This is called a durable power of attorney for finances. It is a separate legal document from the durable power of attorney for health care. You may choose the same person or someone different from your health care proxy to act as your agent in financial matters.   This information is not intended to replace advice given to you by your health care provider. Make sure you discuss any questions you have with your health care provider.   Document Released: 08/02/2007 Document Revised: 04/30/2013 Document Reviewed: 09/12/2012 Elsevier Interactive Patient Education 2016 [employer].  Thank you for choosing [facility].

## 2015-03-23 NOTE — Progress Notes (Signed)
Subjective:    Patient ID: Luis Reed, male    DOB: December 26, 1960, 54 y.o.   MRN: [id number]  Chief Complaint  Patient presents with  . CPE    fasting    HPI:  Luis Reed is a 54 y.o. male who presents today for an annual wellness visit.   1) Health Maintenance -   Diet - Averages about 3 meals per day consisting of chicken, beef, occasional pork, and fruit and vegetables. 1-2 cups of caffeine every few days  Exercise - Basketball, football; 2-3 times per week.   2) Preventative Exams / Immunizations:  Dental -- Due for exam  Vision -- Up to date    Health Maintenance  Topic Date Due  . INFLUENZA VACCINE  12/08/2014  . COLONOSCOPY  09/08/2019  . TETANUS/TDAP  11/07/2022  . Hepatitis C Screening  Completed  . HIV Screening  Completed     Immunization History  Administered Date(s) Administered  . Influenza Split 02/10/2011, 01/08/2012  . Influenza Whole 02/06/2005, 03/14/2006, 02/09/2009, 02/09/2010  . Influenza,inj,Quad PF,36+ Mos 01/17/2013, 01/16/2014  . Pneumococcal Polysaccharide-23 10/18/2005, 02/10/2011     RISK FACTORS  Tobacco History  Smoking status  . Former Smoker -- 0.30 packs/day for 20 years  Smokeless tobacco  . Never Used     Cardiac risk factors: advanced age (older than 10655 for men, 2565 for women) and male gender.  Depression Screen  Q1: Over the past two weeks, have you felt down, depressed or hopeless? No  Q2: Over the past two weeks, have you felt little interest or pleasure in doing things? No  Have you lost interest or pleasure in daily life? No  Do you often feel hopeless? No  Do you cry easily over simple problems? No  Depression screen PHQ 2/9 03/23/2015  Decreased Interest 0  Down, Depressed, Hopeless 0  PHQ - 2 Score 0    Activities of Daily Living In your present state of health, do you have any difficulty performing the following activities?:  Driving? No Managing money?  No Feeding yourself?  No Getting from bed to chair? No Climbing a flight of stairs? No Preparing food and eating?: No Bathing or showering? No Getting dressed: No Getting to the toilet? No Using the toilet: No Moving around from place to place: No In the past year have you fallen or had a near fall?:No   Home Safety Has smoke detector and wears seat belts. No firearms. No excess sun exposure. Are there smokers in your home (other than you)?  No Do you feel safe at home?  Yes  Hearing Difficulties: No Do you often ask people to speak up or repeat themselves? No Do you experience ringing or noises in your ears? No  Do you have difficulty understanding soft or whispered voices? No    Cognitive Testing  Alert? Yes   Normal Appearance? Yes  Oriented to person? Yes  Place? Yes   Time? Yes  Recall of three objects?  Yes  Can perform simple calculations? Yes  Displays appropriate judgment? Yes  Can read the correct time from a watch face? Yes  Do you feel that you have a problem with memory? No  Do you often misplace items? No   Advanced Directives have been discussed with the patient? Yes  Current Physicians/Providers and Suppliers  1. Marcos EkeGreg Calone, FNP - Internal Medicine  2. Melvyn Novasarmen Dohmeier, MD - Neurology 3. Cliffton AstersJohn Campbell, MD - Infectious Disease  4. Lucia EstelleJoseph Coldonato, MD -  Nephroloy   Indicate any recent Medical Services you may have received from other than [facility] providers in the past year (date may be approximate).  All answers were reviewed with the patient and necessary referrals were made:  Jeanine Luz, FNP   03/23/2015    Review of Systems  Constitutional: Denies fever, chills, fatigue, or significant weight gain/loss. HENT: Head: Denies headache or neck pain Ears: Denies changes in hearing, ringing in ears, earache, drainage Nose: Denies discharge, stuffiness, itching, nosebleed, sinus pain Throat: Denies sore throat, hoarseness, dry mouth, sores, thrush Eyes: Denies  loss/changes in vision, pain, redness, blurry/double vision, flashing lights Cardiovascular: Denies chest pain/discomfort, tightness, palpitations, shortness of breath with activity, difficulty lying down, swelling, sudden awakening with shortness of breath Respiratory: Denies shortness of breath, cough, sputum production, wheezing Gastrointestinal: Denies dysphasia, heartburn, change in appetite, nausea, change in bowel habits, rectal bleeding, constipation, diarrhea, yellow skin or eyes Genitourinary: Denies frequency, urgency, burning/pain, blood in urine, incontinence, change in urinary strength. Musculoskeletal: Denies muscle/joint pain, stiffness, back pain, redness or swelling of joints, trauma Skin: Denies rashes, lumps, itching, dryness, color changes, or hair/nail changes Neurological: Denies dizziness, fainting, seizures, weakness, numbness, tingling, tremor Psychiatric - Denies nervousness, stress, depression or memory loss Endocrine: Denies heat or cold intolerance, sweating, frequent urination, excessive thirst, changes in appetite Hematologic: Denies ease of bruising or bleeding    Objective:     BP 120/70 mmHg  Pulse 63  Temp(Src) 98 F (36.7 C) (Oral)  Resp 18  Ht  (1.727 m)  Wt 159 lb 12.8 oz (72.485 kg)  BMI 24.30 kg/m2  SpO2 98% Nursing note and vital signs reviewed.  Physical Exam  Constitutional: He is oriented to person, place, and time. He appears well-developed and well-nourished.  HENT:  Head: Normocephalic.  Right Ear: Hearing, tympanic membrane, external ear and ear canal normal.  Left Ear: Hearing, tympanic membrane, external ear and ear canal normal.  Nose: Nose normal.  Mouth/Throat: Uvula is midline, oropharynx is clear and moist and mucous membranes are normal.  Eyes: Conjunctivae and EOM are normal. Pupils are equal, round, and reactive to light.  Neck: Neck supple. No JVD present. No tracheal deviation present. No thyromegaly present.   Cardiovascular: Normal rate, regular rhythm, normal heart sounds and intact distal pulses.   Pulmonary/Chest: Effort normal and breath sounds normal.  Abdominal: Soft. Bowel sounds are normal. He exhibits no distension and no mass. There is no tenderness. There is no rebound and no guarding.  Musculoskeletal: Normal range of motion. He exhibits no edema or tenderness.  Lymphadenopathy:    He has no cervical adenopathy.  Neurological: He is alert and oriented to person, place, and time. He has normal reflexes. No cranial nerve deficit. He exhibits normal muscle tone. Coordination normal.  Skin: Skin is warm and dry.  Psychiatric: He has a normal mood and affect. His behavior is normal. Judgment and thought content normal.       Assessment & Plan:   During the course of the visit the patient was educated and counseled about appropriate screening and preventive services including:    Pneumococcal vaccine   Influenza vaccine  Td vaccine  Prostate cancer screening  Colorectal cancer screening  Glaucoma screening  Nutrition counseling   Diet review for nutrition referral? Yes ____  Not Indicated _X___   Patient Instructions (the written plan) was given to the patient.  Medicare Attestation I have personally reviewed: The patient's medical and social history Their use of alcohol, tobacco  or illicit drugs Their current medications and supplements The patient's functional ability including ADLs,fall risks, home safety risks, cognitive, and hearing and visual impairment Diet and physical activities Evidence for depression or mood disorders  The patient's weight, height, BMI,  have been recorded in the chart.  I have made referrals, counseling, and provided education to the patient based on review of the above and I have provided the patient with a written personalized care plan for preventive services.     Jeanine Luz, FNP   03/23/2015    Problem List Items Addressed  This Visit      Other   Routine general medical examination at a health care facility    1) Anticipatory Guidance: Discussed importance of wearing a seatbelt while driving and not texting while driving; changing batteries in smoke detector at least once annually; wearing suntan lotion when outside; eating a balanced and moderate diet; getting physical activity at least 30 minutes per day.  2) Immunizations / Screenings / Labs:  Declines flu shot today. All other immunizations are up-to-date per recommendations. Encouraged dental screening to be completed twice annually. Obtain PSA for prostate screening. All other screenings are up-to-date per recommendations. Obtain CBC, BMET, Lipid profile and TSH.   Overall well exam with minimal risk factors for cardiovascular disease at present. Encouraged continued nutritional intake of a varied and moderate diet. Continue physical activity. Chronic conditions of kidney transplant and HIV are currently being managed by specialty providers. Continue other healthy lifestyle behaviors. Follow-up prevention exam in one year. Follow-up office visit pending blood work if needed.      Relevant Orders   CBC (Completed)   Comprehensive metabolic panel (Completed)   TSH (Completed)   PSA (Completed)   Lipid panel (Completed)   Medicare annual wellness visit, subsequent - Primary    Reviewed and updated patient's medical, surgical, family and social history. Medications and allergies were also reviewed. Basic screenings for depression, activities of daily living, hearing, cognition and safety were performed. Provider list was updated and health plan was provided to the patient.

## 2015-03-23 NOTE — Assessment & Plan Note (Signed)
1) Anticipatory Guidance: Discussed importance of wearing a seatbelt while driving and not texting while driving; changing batteries in smoke detector at least once annually; wearing suntan lotion when outside; eating a balanced and moderate diet; getting physical activity at least 30 minutes per day.  2) Immunizations / Screenings / Labs:  Declines flu shot today. All other immunizations are up-to-date per recommendations. Encouraged dental screening to be completed twice annually. Obtain PSA for prostate screening. All other screenings are up-to-date per recommendations. Obtain CBC, BMET, Lipid profile and TSH.   Overall well exam with minimal risk factors for cardiovascular disease at present. Encouraged continued nutritional intake of a varied and moderate diet. Continue physical activity. Chronic conditions of kidney transplant and HIV are currently being managed by specialty providers. Continue other healthy lifestyle behaviors. Follow-up prevention exam in one year. Follow-up office visit pending blood work if needed.

## 2015-03-23 NOTE — Progress Notes (Signed)
Pre visit review using our clinic review tool, if applicable. No additional management support is needed unless otherwise documented below in the visit note. 

## 2015-03-26 DIAGNOSIS — Z940 Kidney transplant status: Secondary | ICD-10-CM | POA: Diagnosis not present

## 2015-03-26 DIAGNOSIS — R8279 Other abnormal findings on microbiological examination of urine: Secondary | ICD-10-CM | POA: Diagnosis not present

## 2015-03-26 DIAGNOSIS — D638 Anemia in other chronic diseases classified elsewhere: Secondary | ICD-10-CM | POA: Diagnosis not present

## 2015-04-23 ENCOUNTER — Ambulatory Visit (INDEPENDENT_AMBULATORY_CARE_PROVIDER_SITE_OTHER): Payer: Medicare Other

## 2015-04-23 DIAGNOSIS — Z23 Encounter for immunization: Secondary | ICD-10-CM | POA: Diagnosis not present

## 2015-05-19 DIAGNOSIS — R8279 Other abnormal findings on microbiological examination of urine: Secondary | ICD-10-CM | POA: Diagnosis not present

## 2015-05-19 DIAGNOSIS — Z940 Kidney transplant status: Secondary | ICD-10-CM | POA: Diagnosis not present

## 2015-05-19 DIAGNOSIS — D638 Anemia in other chronic diseases classified elsewhere: Secondary | ICD-10-CM | POA: Diagnosis not present

## 2015-05-19 DIAGNOSIS — E892 Postprocedural hypoparathyroidism: Secondary | ICD-10-CM | POA: Diagnosis not present

## 2015-05-19 DIAGNOSIS — E785 Hyperlipidemia, unspecified: Secondary | ICD-10-CM | POA: Diagnosis not present

## 2015-05-19 MED FILL — TIVICAY 50 MG TABLET: 50 | 30 days supply | Qty: 30 | Fill #8

## 2015-05-19 MED FILL — TRUVADA 200-300 MG TABS: 200-300 | 30 days supply | Qty: 30 | Fill #7

## 2015-06-17 MED FILL — TRUVADA 200-300 MG TABS: 200-300 | 30 days supply | Qty: 30 | Fill #8

## 2015-06-17 MED FILL — TIVICAY 50 MG TABLET: 50 | 30 days supply | Qty: 30 | Fill #9

## 2015-07-06 DIAGNOSIS — D638 Anemia in other chronic diseases classified elsewhere: Secondary | ICD-10-CM | POA: Diagnosis not present

## 2015-07-06 DIAGNOSIS — Z940 Kidney transplant status: Secondary | ICD-10-CM | POA: Diagnosis not present

## 2015-07-06 DIAGNOSIS — R8279 Other abnormal findings on microbiological examination of urine: Secondary | ICD-10-CM | POA: Diagnosis not present

## 2015-07-16 MED FILL — TIVICAY 50 MG TABLET: 50 | 30 days supply | Qty: 30 | Fill #10

## 2015-07-16 MED FILL — TRUVADA 200-300 MG TABS: 200-300 | 30 days supply | Qty: 30 | Fill #9

## 2015-08-13 DIAGNOSIS — E890 Postprocedural hypothyroidism: Secondary | ICD-10-CM | POA: Diagnosis not present

## 2015-08-13 DIAGNOSIS — D638 Anemia in other chronic diseases classified elsewhere: Secondary | ICD-10-CM | POA: Diagnosis not present

## 2015-08-13 DIAGNOSIS — E785 Hyperlipidemia, unspecified: Secondary | ICD-10-CM | POA: Diagnosis not present

## 2015-08-13 DIAGNOSIS — E892 Postprocedural hypoparathyroidism: Secondary | ICD-10-CM | POA: Diagnosis not present

## 2015-08-13 DIAGNOSIS — Z940 Kidney transplant status: Secondary | ICD-10-CM | POA: Diagnosis not present

## 2015-08-14 ENCOUNTER — Other Ambulatory Visit: Payer: Self-pay | Admitting: Internal Medicine

## 2015-08-14 MED FILL — TRUVADA 200-300 MG TABS: 200-300 | 30 days supply | Qty: 30 | Fill #10

## 2015-08-14 MED FILL — TIVICAY 50 MG TABLET: 50 | 30 days supply | Qty: 30 | Fill #0

## 2015-09-09 ENCOUNTER — Other Ambulatory Visit: Payer: Medicare Other

## 2015-09-14 ENCOUNTER — Other Ambulatory Visit: Payer: Self-pay | Admitting: Internal Medicine

## 2015-09-14 DIAGNOSIS — Z940 Kidney transplant status: Secondary | ICD-10-CM | POA: Diagnosis not present

## 2015-09-14 DIAGNOSIS — D638 Anemia in other chronic diseases classified elsewhere: Secondary | ICD-10-CM | POA: Diagnosis not present

## 2015-09-14 DIAGNOSIS — R8279 Other abnormal findings on microbiological examination of urine: Secondary | ICD-10-CM | POA: Diagnosis not present

## 2015-09-14 MED FILL — TIVICAY 50 MG TABLET: 50 | 30 days supply | Qty: 30 | Fill #1

## 2015-09-14 MED FILL — TRUVADA 200-300 MG TABS: 200-300 | 30 days supply | Qty: 30 | Fill #0

## 2015-09-16 ENCOUNTER — Other Ambulatory Visit (HOSPITAL_COMMUNITY)
Admission: RE | Admit: 2015-09-16 | Discharge: 2015-09-16 | Disposition: A | Discharge status: Home / Self Care | Payer: Medicare Other | Source: Ambulatory Visit | Attending: Internal Medicine | Admitting: Internal Medicine

## 2015-09-16 ENCOUNTER — Other Ambulatory Visit: Payer: Medicare Other

## 2015-09-16 DIAGNOSIS — B20 Human immunodeficiency virus [HIV] disease: Secondary | ICD-10-CM | POA: Diagnosis not present

## 2015-09-16 DIAGNOSIS — Z113 Encounter for screening for infections with a predominantly sexual mode of transmission: Secondary | ICD-10-CM | POA: Diagnosis not present

## 2015-09-16 DIAGNOSIS — Z79899 Other long term (current) drug therapy: Secondary | ICD-10-CM

## 2015-09-16 LAB — CBC WITH DIFFERENTIAL/PLATELET
BASOS PCT: 1 %
Basophils Absolute: 44 cells/uL (ref 0–200)
Eosinophils Absolute: 44 cells/uL (ref 15–500)
Eosinophils Relative: 1 %
HEMATOCRIT: 41.6 % (ref 38.5–50.0)
HEMOGLOBIN: 13.7 g/dL (ref 13.2–17.1)
LYMPHS ABS: 2156 {cells}/uL (ref 850–3900)
Lymphocytes Relative: 49 %
MCH: 33 pg (ref 27.0–33.0)
MCHC: 32.9 g/dL (ref 32.0–36.0)
MCV: 100.2 fL — ABNORMAL HIGH (ref 80.0–100.0)
MONO ABS: 528 {cells}/uL (ref 200–950)
MPV: 9.2 fL (ref 7.5–12.5)
Monocytes Relative: 12 %
Neutro Abs: 1628 cells/uL (ref 1500–7800)
Neutrophils Relative %: 37 %
Platelets: 181 10*3/uL (ref 140–400)
RBC: 4.15 MIL/uL — AB (ref 4.20–5.80)
RDW: 14 % (ref 11.0–15.0)
WBC: 4.4 10*3/uL (ref 3.8–10.8)

## 2015-09-16 LAB — COMPLETE METABOLIC PANEL WITH GFR
ALT: 9 U/L (ref 9–46)
AST: 13 U/L (ref 10–35)
Albumin: 4 g/dL (ref 3.6–5.1)
Alkaline Phosphatase: 65 U/L (ref 40–115)
BUN: 19 mg/dL (ref 7–25)
CHLORIDE: 102 mmol/L (ref 98–110)
CO2: 28 mmol/L (ref 20–31)
Calcium: 7.2 mg/dL — ABNORMAL LOW (ref 8.6–10.3)
Creat: 1.98 mg/dL — ABNORMAL HIGH (ref 0.70–1.33)
GFR, EST AFRICAN AMERICAN: 42 mL/min — AB (ref >=60)
GFR, EST NON AFRICAN AMERICAN: 37 mL/min — AB (ref >=60)
GLUCOSE: 73 mg/dL (ref 65–99)
POTASSIUM: 4.5 mmol/L (ref 3.5–5.3)
SODIUM: 139 mmol/L (ref 135–146)
TOTAL PROTEIN: 6.8 g/dL (ref 6.1–8.1)
Total Bilirubin: 0.6 mg/dL (ref 0.2–1.2)

## 2015-09-17 LAB — HIV-1 RNA QUANT-NO REFLEX-BLD
HIV 1 RNA Quant: <20 copies/mL (ref <20)
HIV-1 RNA Quant, Log: <1.3 Log copies/mL (ref <1.30)

## 2015-09-17 LAB — RPR

## 2015-09-17 LAB — T-HELPER CELL (CD4) - (RCID CLINIC ONLY)
CD4 % Helper T Cell: 33 % (ref 33–55)
CD4 T Cell Abs: 730 /uL (ref 400–2700)

## 2015-09-17 LAB — URINE CYTOLOGY ANCILLARY ONLY
CHLAMYDIA, DNA PROBE: NEGATIVE
NEISSERIA GONORRHEA: NEGATIVE

## 2015-09-26 ENCOUNTER — Emergency Department (HOSPITAL_COMMUNITY)
Admission: EM | Admit: 2015-09-26 | Discharge: 2015-09-26 | Disposition: A | Discharge status: Home / Self Care | Payer: Medicare Other | Attending: Emergency Medicine | Admitting: Emergency Medicine

## 2015-09-26 ENCOUNTER — Encounter (HOSPITAL_COMMUNITY): Payer: Self-pay

## 2015-09-26 DIAGNOSIS — Y9289 Other specified places as the place of occurrence of the external cause: Secondary | ICD-10-CM | POA: Diagnosis not present

## 2015-09-26 DIAGNOSIS — Y998 Other external cause status: Secondary | ICD-10-CM | POA: Diagnosis not present

## 2015-09-26 DIAGNOSIS — B20 Human immunodeficiency virus [HIV] disease: Secondary | ICD-10-CM | POA: Insufficient documentation

## 2015-09-26 DIAGNOSIS — W208XXA Other cause of strike by thrown, projected or falling object, initial encounter: Secondary | ICD-10-CM | POA: Diagnosis not present

## 2015-09-26 DIAGNOSIS — Z79899 Other long term (current) drug therapy: Secondary | ICD-10-CM | POA: Diagnosis not present

## 2015-09-26 DIAGNOSIS — S0501XA Injury of conjunctiva and corneal abrasion without foreign body, right eye, initial encounter: Secondary | ICD-10-CM | POA: Diagnosis not present

## 2015-09-26 DIAGNOSIS — Z87891 Personal history of nicotine dependence: Secondary | ICD-10-CM | POA: Insufficient documentation

## 2015-09-26 DIAGNOSIS — Z7952 Long term (current) use of systemic steroids: Secondary | ICD-10-CM | POA: Insufficient documentation

## 2015-09-26 DIAGNOSIS — S0591XA Unspecified injury of right eye and orbit, initial encounter: Secondary | ICD-10-CM | POA: Diagnosis present

## 2015-09-26 DIAGNOSIS — Y9389 Activity, other specified: Secondary | ICD-10-CM | POA: Diagnosis not present

## 2015-09-26 DIAGNOSIS — Z87448 Personal history of other diseases of urinary system: Secondary | ICD-10-CM | POA: Insufficient documentation

## 2015-09-26 MED ORDER — ERYTHROMYCIN 5 MG/GM OP OINT: TOPICAL_OINTMENT | OPHTHALMIC | Status: DC

## 2015-09-26 MED ORDER — TETRACAINE HCL 0.5 % OP SOLN
2.0000 [drp] | Freq: Once | OPHTHALMIC | Status: AC
  Administered 2015-09-26: 2 [drp] via OPHTHALMIC
  Filled 2015-09-26: qty 2

## 2015-09-26 MED ORDER — FLUORESCEIN SODIUM 1 MG OP STRP
1.0000 | ORAL_STRIP | Freq: Once | OPHTHALMIC | Status: AC
  Administered 2015-09-26: 1 via OPHTHALMIC
  Filled 2015-09-26: qty 1

## 2015-09-26 NOTE — ED Notes (Signed)
Declined W/C at D/C and was escorted to lobby by RN. 
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
## 2015-09-26 NOTE — ED Provider Notes (Signed)
History  By signing my name below, I, Earmon PhoenixJennifer Waddell, attest that this documentation has been prepared under the direction and in the presence of Fayrene HelperBowie Grethel Zenk, PA-C. Electronically Signed: Earmon PhoenixJennifer Waddell, ED Scribe. 09/26/2015. 4:55 PM.  Chief Complaint  Patient presents with  . Eye Injury   The history is provided by the patient and medical records. No language interpreter was used.    HPI Comments:  Luis Reed is a 55 y.o. HIV positive male with PMHx of Hepatitis C who presents to the Emergency Department complaining of a right eye injury that occurred about 3.5 hours ago. Pt states he was working on a roof and pulled a shingle up, causing it to fly up and hit him in the eye. He reports associated blurred vision, burning pain and redness to the right eye. He has not done anything to treat the symptoms. He denies modifying factors of the pain. He denies vision loss, blurred vision, foreign body sensation, nausea or vomiting. He denies contact lens use. Dr. Ronne BinningMcKenzie is pt's eye doctor. He states his last tetanus vaccination was less than 5 years ago.   Past Medical History  Diagnosis Date  . Renal disorder renal failure  . Hepatitis C   . HIV infection [facility]HCC)    Past Surgical History  Procedure Laterality Date  . Nephrectomy transplanted organ     Family History  Problem Relation Age of Onset  . Prostate cancer Father    Social History  Substance Use Topics  . Smoking status: Former Smoker -- 0.30 packs/day for 20 years  . Smokeless tobacco: Never Used  . Alcohol Use: No    Review of Systems  Eyes: Positive for pain, discharge and redness. Negative for visual disturbance.  Gastrointestinal: Negative for nausea and vomiting.    Allergies  Hydrocodone  Home Medications   Prior to Admission medications   Medication Sig Start Date End Date Taking? Authorizing Provider  acetaminophen (TYLENOL) 325 MG tablet Take 1 tablet (325 mg total) by mouth every 6 (six) hours as  needed for mild pain or moderate pain (pain). 04/12/13   Jennifer Piepenbrink, PA-C  CELLCEPT 250 MG capsule Take 250 mg by mouth 2 (two) times daily. 01/29/14   Historical Provider, MD  cetirizine (ZYRTEC ALLERGY) 10 MG tablet Take 1 tablet (10 mg total) by mouth daily. 09/09/14   Roxy Horsemanobert Browning, PA-C  dolutegravir (TIVICAY) 50 MG tablet Take 1 tablet (50 mg total) by mouth daily. 08/07/14   Cliffton AstersJohn Campbell, MD  latanoprost (XALATAN) 0.005 % ophthalmic solution  06/03/14   Historical Provider, MD  LORazepam (ATIVAN) 1 MG tablet Take 1 tablet (1 mg total) by mouth at bedtime. 07/18/14   Porfirio Mylararmen Dohmeier, MD  predniSONE (DELTASONE) 5 MG tablet Take 5 mg by mouth daily. Take on Monday, Wednesday and Friday nights    Historical Provider, MD  tacrolimus (PROGRAF) 1 MG capsule Take 1 mg by mouth 2 (two) times daily. Take with 5 mg tablet for a 6 mg dose    Historical Provider, MD  tacrolimus (PROGRAF) 5 MG capsule Take 5 mg by mouth 2 (two) times daily. Take with 1 mg tablet for a 6 mg dose    Historical Provider, MD  TIVICAY 50 MG tablet TAKE 1 TABLET BY MOUTH DAILY. 08/14/15   Cliffton AstersJohn Campbell, MD  TRUVADA 200-300 MG tablet TAKE 1 TABLET BY MOUTH DAILY. 09/14/15   Cliffton AstersJohn Campbell, MD   Triage Vitals: BP 137/82 mmHg  Pulse 77  Temp(Src) 98.4 F (36.9  C) (Oral)  Resp 22  SpO2 97% Physical Exam  Constitutional: He is oriented to person, place, and time. He appears well-developed and well-nourished.  HENT:  Head: Normocephalic and atraumatic.  Eyes: EOM and lids are normal. Pupils are equal, round, and reactive to light. Lids are everted and swept, no foreign bodies found. Right eye exhibits no chemosis, no discharge, no exudate and no hordeolum. No foreign body present in the right eye. Right conjunctiva is injected. Right conjunctiva has a hemorrhage (subconjunctival to right lateral aspect).  Slit lamp exam:      The right eye shows corneal abrasion and fluorescein uptake. The right eye shows no corneal flare, no  corneal ulcer, no foreign body, no hyphema and no hypopyon.  Right lens intact. 3 mm abrasion to the lateral conjunctiva of right eye. Negative Seidel sign. No foreign object.    Visual Acuity  Right Eye Distance: 10/20 (Pt. does not wear glasses) Left Eye Distance: 10/20 (Pt. does not wear glasses) Bilateral Distance: 10/20 (Pt. does not wear glasses)    Neck: Normal range of motion.  Cardiovascular: Normal rate.   Pulmonary/Chest: Effort normal.  Musculoskeletal: Normal range of motion.  Neurological: He is alert and oriented to person, place, and time.  Skin: Skin is warm and dry.  Psychiatric: He has a normal mood and affect. His behavior is normal.  Nursing note and vitals reviewed.   ED Course  Procedures (including critical care time) DIAGNOSTIC STUDIES: Oxygen Saturation is 97% on RA, normal by my interpretation.   COORDINATION OF CARE: 4:40 PM- Will instill tetracaine drops and apply fluorescein strip to check for abrasions or laceration of the right eye. Pt verbalizes understanding and agrees to plan.  Medications  fluorescein ophthalmic strip 1 strip (1 strip Both Eyes Given 09/26/15 1641)  tetracaine (PONTOCAINE) 0.5 % ophthalmic solution 2 drop (2 drops Right Eye Given 09/26/15 1641)    MDM   Final diagnoses:  Right cornea abrasion, initial encounter    BP 137/82 mmHg  Pulse 77  Temp(Src) 98.4 F (36.9 C) (Oral)  Resp 22  SpO2 97%   I personally performed the services described in this documentation, which was scribed in my presence. The recorded information has been reviewed and is accurate.       Fayrene Helper, PA-C 09/26/15 1657  Arby Barrette, MD 10/01/15 [phone]

## 2015-09-26 NOTE — Discharge Instructions (Signed)
Corneal Abrasion °The cornea is the clear covering at the front and center of the eye. When you look at the colored portion of the eye, you are looking through the cornea. It is a thin tissue made up of layers. The top layer is the most sensitive layer. A corneal abrasion happens if this layer is scratched or an injury causes it to come off.  °HOME CARE °· You may be given drops or a medicated cream. Use the medicine as told by your doctor. °· A pressure patch may be put over the eye. If this is done, follow your doctor's instructions for when to remove the patch. Do not drive or use machines while the eye patch is on. Judging distances is hard to do with a patch on. °· See your doctor for a follow-up exam if you are told to do so. It is very important that you keep this appointment. °GET HELP IF:  °· You have pain, are sensitive to light, and have a scratchy feeling in one eye or both eyes. °· Your pressure patch keeps getting loose. You can blink your eye under the patch. °· You have fluid coming from your eye or the lids stick together in the morning. °· You have the same symptoms in the morning that you did with the first abrasion. This could be days, weeks, or months after the first abrasion healed. °  °This information is not intended to replace advice given to you by your health care provider. Make sure you discuss any questions you have with your health care provider. °  °Document Released: 10/12/2007 Document Revised: 01/14/2015 Document Reviewed: 12/31/2012 °Elsevier Interactive Patient Education ©2016 [employer] Inc[employer] ° °

## 2015-10-08 ENCOUNTER — Ambulatory Visit (INDEPENDENT_AMBULATORY_CARE_PROVIDER_SITE_OTHER): Payer: Medicare Other | Admitting: Internal Medicine

## 2015-10-08 ENCOUNTER — Encounter: Payer: Self-pay | Admitting: Internal Medicine

## 2015-10-08 VITALS — BP 111/70 | HR 66 | Temp 97.7°F | Wt 151.5 lb

## 2015-10-08 DIAGNOSIS — B20 Human immunodeficiency virus [HIV] disease: Secondary | ICD-10-CM

## 2015-10-08 DIAGNOSIS — B182 Chronic viral hepatitis C: Secondary | ICD-10-CM

## 2015-10-08 MED ORDER — EMTRICITABINE-TENOFOVIR AF 200-25 MG PO TABS: 1.0000 | ORAL_TABLET | Freq: Every day | ORAL | Status: DC

## 2015-10-08 MED FILL — DESCOVY 200-25 MG TABS: 200-25 | 30 days supply | Qty: 30 | Fill #0

## 2015-10-08 NOTE — Assessment & Plan Note (Signed)
He completed therapy with hard bony last year and one hepatitis C viral load was undetectable. He has probably been cured. I will repeat another viral load before his visit in 6 months.

## 2015-10-08 NOTE — Assessment & Plan Note (Signed)
His infection remains under excellent control. I will change Truvada to the new preparation, Descovy, to reduce his risk of nephrotoxicity and osteoporosis. He will continue Tivicay. He will follow-up after lab work in 6 months.

## 2015-10-08 NOTE — Progress Notes (Signed)
Patient Active Problem List   Diagnosis Date Noted  . Routine general medical examination at a health care facility 03/23/2015  . Medicare annual wellness visit, subsequent 03/23/2015  . Insomnia disorder, with non-sleep disorder mental comorbidity 10/20/2014  . Other specified anxiety disorders 07/18/2014  . Chronic insomnia 07/18/2014  . RA (retrograde amnesia) 07/18/2014  . TBI (traumatic brain injury) (HCC) 07/18/2014  . Renal transplant recipient 07/18/2014  . Sleep disorder due to a general medical condition, parasomnia type 07/18/2014  . Post concussive syndrome 05/13/2014  . 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 H/O kidney transplant 06/06/2011  . Essential (primary) hypertension 06/06/2011  . Carrier of viral hepatitis c 06/06/2011  . H/O disease 06/06/2011  . HIV positive (HCC) 06/06/2011  . KIDNEY TRANSPLANTATION 07/11/2007  . Hepatitis C virus infection without hepatic coma 03/15/2006  . SYPHILIS 03/15/2006  . HYPERTENSION 03/15/2006  . RENAL DISEASE, END STAGE 03/15/2006  . HYDROCELE NOS 03/15/2006  . SEIZURE DISORDER 03/15/2006  . ALCOHOL ABUSE, HX OF 03/15/2006  . HEPATITIS B, HX OF 03/15/2006  . TOBACCO USE, QUIT 03/15/2006  . PARATHYROIDECTOMY 08/08/2002  . CEREBROVASCULAR ACCIDENT, ACUTE 10/08/1999  . Human immunodeficiency virus (HIV) disease (HCC) 05/10/1999    Patient's Medications  New Prescriptions   EMTRICITABINE-TENOFOVIR AF (DESCOVY) 200-25 MG TABLET    Take 1 tablet by mouth daily.  Previous Medications   ACETAMINOPHEN (TYLENOL) 325 MG TABLET    Take 1 tablet (325 mg total) by mouth every 6 (six) hours as needed for mild pain or moderate pain (pain).   CELLCEPT 250 MG CAPSULE    Take 250 mg by mouth 2 (two) times daily.   CETIRIZINE (ZYRTEC ALLERGY) 10 MG TABLET    Take 1 tablet (10 mg total) by mouth daily.   ERYTHROMYCIN OPHTHALMIC OINTMENT    Place a 1/2 inch ribbon of ointment into the lower eyelid every 8 hrs while awake  for 5 days   LATANOPROST (XALATAN) 0.005 % OPHTHALMIC SOLUTION       LORAZEPAM (ATIVAN) 1 MG TABLET    Take 1 tablet (1 mg total) by mouth at bedtime.   PREDNISONE (DELTASONE) 5 MG TABLET    Take 5 mg by mouth daily. Take on Monday, Wednesday and Friday nights   TACROLIMUS (PROGRAF) 1 MG CAPSULE    Take 1 mg by mouth 2 (two) times daily. Take with 5 mg tablet for a 6 mg dose   TACROLIMUS (PROGRAF) 5 MG CAPSULE    Take 5 mg by mouth 2 (two) times daily. Take with 1 mg tablet for a 6 mg dose   TIVICAY 50 MG TABLET    TAKE 1 TABLET BY MOUTH DAILY.  Modified Medications   No medications on file  Discontinued Medications   DOLUTEGRAVIR (TIVICAY) 50 MG TABLET    Take 1 tablet (50 mg total) by mouth daily.   TRUVADA 200-300 MG TABLET    TAKE 1 TABLET BY MOUTH DAILY.    Subjective: Luis Reed is in for his routine HIV visit. He lost his Medicare last fall and was off of his Truvada and Tivicay for 3 days before he got new insurance with [employer]. He has not missed any doses of his medication since that time   Review of Systems: Review of Systems  Constitutional: Negative for fever, chills, weight loss, malaise/fatigue and diaphoresis.  HENT: Negative for sore throat.   Respiratory: Negative for cough, sputum production and shortness of  breath.   Cardiovascular: Negative for chest pain.  Gastrointestinal: Negative for nausea, vomiting and diarrhea.  Genitourinary: Negative for dysuria and frequency.  Musculoskeletal: Negative for myalgias and joint pain.  Skin: Negative for rash.  Neurological: Negative for dizziness and headaches.  Psychiatric/Behavioral: Negative for depression and substance abuse. The patient is not nervous/anxious.     Past Medical History  Diagnosis Date  . Renal disorder renal failure  . Hepatitis C   . HIV infection (HCC)     Social History  Substance Use Topics  . Smoking status: Former Smoker -- 0.30 packs/day for 20 years  . Smokeless tobacco: Never  Used  . Alcohol Use: No    Family History  Problem Relation Age of Onset  . Prostate cancer Father     Allergies  Allergen Reactions  . Hydrocodone Itching and Other (See Comments)    fidgety    Objective:  Filed Vitals:   10/08/15 0845  BP: 111/70  Pulse: 66  Temp: 97.7 F (36.5 C)  TempSrc: Oral  Weight: 151 lb 8 oz (68.72 kg)   Body mass index is 23.04 kg/(m^2).  Physical Exam  Constitutional: He is oriented to person, place, and time.  He is in good spirits as usual.  HENT:  Mouth/Throat: No oropharyngeal exudate.  Eyes: Conjunctivae are normal.  Cardiovascular: Normal rate and regular rhythm.   No murmur heard. Pulmonary/Chest: Breath sounds normal.  Abdominal: Soft. There is no tenderness.  Neurological: He is alert and oriented to person, place, and time.  Skin: No rash noted.  Psychiatric: Mood and affect normal.    Lab Results Lab Results  Component Value Date   WBC 4.4 09/16/2015   HGB 13.7 09/16/2015   HCT 41.6 09/16/2015   MCV 100.2* 09/16/2015   PLT 181 09/16/2015    Lab Results  Component Value Date   CREATININE 1.98* 09/16/2015   BUN 19 09/16/2015   NA 139 09/16/2015   K 4.5 09/16/2015   CL 102 09/16/2015   CO2 28 09/16/2015    Lab Results  Component Value Date   ALT 9 09/16/2015   AST 13 09/16/2015   ALKPHOS 65 09/16/2015   BILITOT 0.6 09/16/2015    Lab Results  Component Value Date   CHOL 128 03/23/2015   HDL 62.90 03/23/2015   LDLCALC 54 03/23/2015   TRIG 52.0 03/23/2015   CHOLHDL 2 03/23/2015    Lab Results HIV 1 RNA QUANT (copies/mL)  Date Value  09/16/2015 <20  11/24/2014 <20  07/14/2014 26*   CD4 T CELL ABS (/uL)  Date Value  09/16/2015 730  11/24/2014 450  07/14/2014 630      Problem List Items Addressed This Visit      Unprioritized   Hepatitis C virus infection without hepatic coma - Primary    He completed therapy with hard bony last year and one hepatitis C viral load was undetectable. He has  probably been cured. I will repeat another viral load before his visit in 6 months.      Relevant Medications   emtricitabine-tenofovir AF (DESCOVY) 200-25 MG tablet   Other Relevant Orders   Hepatitis C RNA quantitative   Human immunodeficiency virus (HIV) disease (HCC)    His infection remains under excellent control. I will change Truvada to the new preparation, Descovy, to reduce his risk of nephrotoxicity and osteoporosis. He will continue Tivicay. He will follow-up after lab work in 6 months.      Relevant Medications   emtricitabine-tenofovir  AF (DESCOVY) 200-25 MG tablet   Other Relevant Orders   T-helper cell (CD4)- ([facility] clinic only)   HIV 1 RNA quant-no reflex-bld        Cliffton Asters, MD [facility] [phone] pager   [phone] cell 10/08/2015, 9:04 AM

## 2015-11-04 DIAGNOSIS — N2581 Secondary hyperparathyroidism of renal origin: Secondary | ICD-10-CM | POA: Diagnosis not present

## 2015-11-04 DIAGNOSIS — T8611 Kidney transplant rejection: Secondary | ICD-10-CM | POA: Diagnosis not present

## 2015-11-04 DIAGNOSIS — R8279 Other abnormal findings on microbiological examination of urine: Secondary | ICD-10-CM | POA: Diagnosis not present

## 2015-11-04 DIAGNOSIS — M47816 Spondylosis without myelopathy or radiculopathy, lumbar region: Secondary | ICD-10-CM | POA: Diagnosis not present

## 2015-11-04 DIAGNOSIS — Z940 Kidney transplant status: Secondary | ICD-10-CM | POA: Diagnosis not present

## 2015-11-06 MED FILL — DESCOVY 200-25 MG TABS: 200-25 | 30 days supply | Qty: 30 | Fill #1

## 2015-11-11 MED FILL — TIVICAY 50 MG TABLET: 50 | 30 days supply | Qty: 30 | Fill #2

## 2015-12-07 MED FILL — DESCOVY 200-25 MG TABS: 200-25 | 30 days supply | Qty: 30 | Fill #2

## 2015-12-09 MED FILL — TIVICAY 50 MG TABLET: 50 | 30 days supply | Qty: 30 | Fill #3

## 2015-12-15 DIAGNOSIS — Z940 Kidney transplant status: Secondary | ICD-10-CM | POA: Diagnosis not present

## 2015-12-15 DIAGNOSIS — D638 Anemia in other chronic diseases classified elsewhere: Secondary | ICD-10-CM | POA: Diagnosis not present

## 2016-01-05 ENCOUNTER — Other Ambulatory Visit: Payer: Self-pay | Admitting: Internal Medicine

## 2016-01-05 DIAGNOSIS — B20 Human immunodeficiency virus [HIV] disease: Secondary | ICD-10-CM

## 2016-01-05 MED FILL — DESCOVY 200-25 MG TABS: 200-25 | 30 days supply | Qty: 30 | Fill #3

## 2016-01-05 MED FILL — TIVICAY 50 MG TABLET: 50 | 30 days supply | Qty: 30 | Fill #0

## 2016-02-03 MED FILL — DESCOVY 200-25 MG TABS: 200-25 | 30 days supply | Qty: 30 | Fill #4

## 2016-02-03 MED FILL — TIVICAY 50 MG TABLET: 50 | 30 days supply | Qty: 30 | Fill #1

## 2016-02-09 DIAGNOSIS — E785 Hyperlipidemia, unspecified: Secondary | ICD-10-CM | POA: Diagnosis not present

## 2016-02-09 DIAGNOSIS — Z940 Kidney transplant status: Secondary | ICD-10-CM | POA: Diagnosis not present

## 2016-02-09 DIAGNOSIS — D638 Anemia in other chronic diseases classified elsewhere: Secondary | ICD-10-CM | POA: Diagnosis not present

## 2016-02-09 DIAGNOSIS — E892 Postprocedural hypoparathyroidism: Secondary | ICD-10-CM | POA: Diagnosis not present

## 2016-02-29 DIAGNOSIS — Z940 Kidney transplant status: Secondary | ICD-10-CM | POA: Diagnosis not present

## 2016-02-29 DIAGNOSIS — T8611 Kidney transplant rejection: Secondary | ICD-10-CM | POA: Diagnosis not present

## 2016-02-29 DIAGNOSIS — Z23 Encounter for immunization: Secondary | ICD-10-CM | POA: Diagnosis not present

## 2016-02-29 DIAGNOSIS — N2581 Secondary hyperparathyroidism of renal origin: Secondary | ICD-10-CM | POA: Diagnosis not present

## 2016-02-29 DIAGNOSIS — R8279 Other abnormal findings on microbiological examination of urine: Secondary | ICD-10-CM | POA: Diagnosis not present

## 2016-03-02 ENCOUNTER — Other Ambulatory Visit: Payer: Self-pay | Admitting: Vascular Surgery

## 2016-03-02 DIAGNOSIS — N184 Chronic kidney disease, stage 4 (severe): Secondary | ICD-10-CM

## 2016-03-02 DIAGNOSIS — Z01810 Encounter for preprocedural cardiovascular examination: Secondary | ICD-10-CM

## 2016-03-03 MED FILL — TIVICAY 50 MG TABLET: 50 | 30 days supply | Qty: 30 | Fill #2

## 2016-03-03 MED FILL — DESCOVY 200-25 MG TABS: 200-25 | 30 days supply | Qty: 30 | Fill #5

## 2016-03-23 ENCOUNTER — Encounter: Payer: Self-pay | Admitting: Vascular Surgery

## 2016-03-30 ENCOUNTER — Ambulatory Visit (INDEPENDENT_AMBULATORY_CARE_PROVIDER_SITE_OTHER): Payer: Medicare Other | Admitting: Vascular Surgery

## 2016-03-30 ENCOUNTER — Ambulatory Visit (INDEPENDENT_AMBULATORY_CARE_PROVIDER_SITE_OTHER)
Admission: RE | Admit: 2016-03-30 | Discharge: 2016-03-30 | Disposition: A | Discharge status: Home / Self Care | Payer: Medicare Other | Source: Ambulatory Visit | Attending: Vascular Surgery | Admitting: Vascular Surgery

## 2016-03-30 ENCOUNTER — Encounter: Payer: Self-pay | Admitting: Vascular Surgery

## 2016-03-30 ENCOUNTER — Ambulatory Visit (HOSPITAL_COMMUNITY)
Admission: RE | Admit: 2016-03-30 | Discharge: 2016-03-30 | Disposition: A | Discharge status: Home / Self Care | Payer: Medicare Other | Source: Ambulatory Visit | Attending: Vascular Surgery | Admitting: Vascular Surgery

## 2016-03-30 VITALS — BP 137/74 | HR 68 | Temp 98.0°F | Resp 16 | Ht 68.0 in | Wt 156.0 lb

## 2016-03-30 DIAGNOSIS — Z01810 Encounter for preprocedural cardiovascular examination: Secondary | ICD-10-CM

## 2016-03-30 DIAGNOSIS — Z992 Dependence on renal dialysis: Secondary | ICD-10-CM | POA: Diagnosis not present

## 2016-03-30 DIAGNOSIS — N184 Chronic kidney disease, stage 4 (severe): Secondary | ICD-10-CM

## 2016-03-30 DIAGNOSIS — N186 End stage renal disease: Secondary | ICD-10-CM

## 2016-03-30 NOTE — Progress Notes (Signed)
Patient ID: Luis Reed, male   DOB: 1961-02-02, 55 y.o.   MRN: [id number]  Reason for Consult: New Evaluation (to discuss AVF)   Referred by Terrial Rhodesoladonato, Joseph, MD  Subjective:     HPI:  Luis Reed is a 55 y.o. male is here today for evaluation of his right upper extremity. He is actually confused about why he is here given that he has per his report a working kidney. He does complain of pain in his left upper extremity where he has an old fistula but he is not concerned about this. He does not want any intervention as far as fistula or graft as he states that his kidney is working well and should not need dialysis in the near future.  Past Medical History:  Diagnosis Date  . Hepatitis C   . HIV infection (HCC)   . Renal disorder renal failure   Family History  Problem Relation Age of Onset  . Prostate cancer Father    Past Surgical History:  Procedure Laterality Date  . NEPHRECTOMY TRANSPLANTED ORGAN      Short Social History:  Social History  Substance Use Topics  . Smoking status: Former Smoker    Packs/day: 0.30    Years: 20.00  . Smokeless tobacco: Never Used  . Alcohol use No    Allergies  Allergen Reactions  . Hydrocodone Itching and Other (See Comments)    fidgety    Current Outpatient Prescriptions  Medication Sig Dispense Refill  . acetaminophen (TYLENOL) 325 MG tablet Take 1 tablet (325 mg total) by mouth every 6 (six) hours as needed for mild pain or moderate pain (pain). 30 tablet 0  . CELLCEPT 250 MG capsule Take 250 mg by mouth 2 (two) times daily.  3  . cetirizine (ZYRTEC ALLERGY) 10 MG tablet Take 1 tablet (10 mg total) by mouth daily. 30 tablet 1  . emtricitabine-tenofovir AF (DESCOVY) 200-25 MG tablet Take 1 tablet by mouth daily. 30 tablet 11  . erythromycin ophthalmic ointment Place a 1/2 inch ribbon of ointment into the lower eyelid every 8 hrs while awake for 5 days 1 g 0  . latanoprost (XALATAN) 0.005 % ophthalmic solution   2    . LORazepam (ATIVAN) 1 MG tablet Take 1 tablet (1 mg total) by mouth at bedtime. 30 tablet 0  . predniSONE (DELTASONE) 5 MG tablet Take 5 mg by mouth daily. Take on Monday, Wednesday and Friday nights    . tacrolimus (PROGRAF) 1 MG capsule Take 1 mg by mouth 2 (two) times daily. Take with 5 mg tablet for a 6 mg dose    . tacrolimus (PROGRAF) 5 MG capsule Take 5 mg by mouth 2 (two) times daily. Take with 1 mg tablet for a 6 mg dose    . TIVICAY 50 MG tablet TAKE 1 TABLET BY MOUTH DAILY. 30 tablet 7   No current facility-administered medications for this visit.     Review of Systems  Cardiovascular: Cardiovascular negative.  Musculoskeletal:       Left arm pain with straightening Skin: Skin negative.  Neurological: Neurological negative. Hematologic: Hematologic/lymphatic negative.        Objective:  Objective   Vitals:   03/30/16 1036  BP: 137/74  Pulse: 68  Resp: 16  Temp: 98 F (36.7 C)  SpO2: 96%  Weight: 156 lb (70.8 kg)  Height: 5\' 8"  (1.727 m)   Body mass index is 23.72 kg/m.  Physical Exam  Constitutional: He is  oriented to person, place, and time. He appears well-developed.  Neck: Normal range of motion.  Cardiovascular: Normal rate.   Pulmonary/Chest: Effort normal.  Musculoskeletal:  Left arm avf with palpable thrill, multiple side branches visible. Hand is warm  Neurological: He is alert and oriented to person, place, and time.  Skin: Skin is warm and dry.  Psychiatric: He has a normal mood and affect. His behavior is normal. Judgment and thought content normal.    Data: He does not have a visible cephalic vein in the right arm. Basilic vein is of adequate size at the antecubital fossa for consideration of a 2 stage basilic vein transposition. He has a right brachial artery that is 0.51 cm and triphasic waveform.     Assessment/Plan:    55 year old African-American gentleman who is here today with what he says is a working renal transplant. He does not  want consideration for new AV fistula graft in the right arm. He does have complaints of left arm pain at site of his old fistula but also does not want intervention on that. Should he need dialysis think it is reasonable to attempt to use his left arm AV fistula. He was somewhat frustrated on today's exam and did not really want any consideration of procedures. With this we'll see him back on a when necessary basis.     Maeola HarmanBrandon Christopher Mannat Benedetti MD [facility]

## 2016-04-11 ENCOUNTER — Telehealth: Payer: Self-pay | Admitting: *Deleted

## 2016-04-11 ENCOUNTER — Ambulatory Visit: Payer: Medicare Other | Admitting: Internal Medicine

## 2016-04-11 NOTE — Telephone Encounter (Signed)
UNABLE TO CONTACT PATIENT TO SCHEDULE NEW APPT WITH DR. Orvan FalconerAMPBELL.

## 2016-04-13 MED FILL — DESCOVY 200-25 MG TABS: 200-25 | 30 days supply | Qty: 30 | Fill #6

## 2016-04-13 MED FILL — TIVICAY 50 MG TABLET: 50 | 30 days supply | Qty: 30 | Fill #3

## 2016-04-14 DIAGNOSIS — H40013 Open angle with borderline findings, low risk, bilateral: Secondary | ICD-10-CM | POA: Diagnosis not present

## 2016-04-14 DIAGNOSIS — H52223 Regular astigmatism, bilateral: Secondary | ICD-10-CM | POA: Diagnosis not present

## 2016-04-14 DIAGNOSIS — H524 Presbyopia: Secondary | ICD-10-CM | POA: Diagnosis not present

## 2016-04-14 DIAGNOSIS — H5203 Hypermetropia, bilateral: Secondary | ICD-10-CM | POA: Diagnosis not present

## 2016-05-11 DIAGNOSIS — Z940 Kidney transplant status: Secondary | ICD-10-CM | POA: Diagnosis not present

## 2016-05-11 MED FILL — DESCOVY 200-25 MG TABS: 200-25 | 30 days supply | Qty: 30 | Fill #7 | Status: TO

## 2016-05-11 MED FILL — TIVICAY 50 MG TABLET: 50 | 30 days supply | Qty: 30 | Fill #4 | Status: TO

## 2016-06-10 DIAGNOSIS — Z940 Kidney transplant status: Secondary | ICD-10-CM | POA: Diagnosis not present

## 2016-06-10 MED FILL — DESCOVY 200-25 MG TABS: 200-25 | 30 days supply | Qty: 30 | Fill #0

## 2016-06-10 MED FILL — TIVICAY 50 MG TABLET: 50 | 30 days supply | Qty: 30 | Fill #0

## 2016-07-13 DIAGNOSIS — Z940 Kidney transplant status: Secondary | ICD-10-CM | POA: Diagnosis not present

## 2016-07-13 DIAGNOSIS — N2581 Secondary hyperparathyroidism of renal origin: Secondary | ICD-10-CM | POA: Diagnosis not present

## 2016-07-13 DIAGNOSIS — Z9225 Personal history of immunosupression therapy: Secondary | ICD-10-CM | POA: Diagnosis not present

## 2016-07-13 DIAGNOSIS — T8611 Kidney transplant rejection: Secondary | ICD-10-CM | POA: Diagnosis not present

## 2016-07-18 MED FILL — TIVICAY 50 MG TABLET: 50 | 30 days supply | Qty: 30 | Fill #1

## 2016-07-18 MED FILL — DESCOVY 200-25 MG TABS: 200-25 | 30 days supply | Qty: 30 | Fill #1

## 2016-08-10 DIAGNOSIS — N2581 Secondary hyperparathyroidism of renal origin: Secondary | ICD-10-CM | POA: Diagnosis not present

## 2016-08-10 DIAGNOSIS — Z940 Kidney transplant status: Secondary | ICD-10-CM | POA: Diagnosis not present

## 2016-08-10 MED FILL — DESCOVY 200-25 MG TABS: 200-25 | 30 days supply | Qty: 30 | Fill #2

## 2016-08-10 MED FILL — TIVICAY 50 MG TABLET: 50 | 30 days supply | Qty: 30 | Fill #2

## 2016-08-15 ENCOUNTER — Other Ambulatory Visit: Payer: Self-pay | Admitting: Pharmacist Clinician (PhC)/ Clinical Pharmacy Specialist

## 2016-08-15 DIAGNOSIS — B20 Human immunodeficiency virus [HIV] disease: Secondary | ICD-10-CM

## 2016-08-15 MED ORDER — DOLUTEGRAVIR SODIUM 50 MG PO TABS: 50.0000 mg | ORAL_TABLET | Freq: Every day | ORAL | 3 refills | Status: DC

## 2016-08-15 MED ORDER — EMTRICITABINE-TENOFOVIR AF 200-25 MG PO TABS: 1.0000 | ORAL_TABLET | Freq: Every day | ORAL | 3 refills | Status: DC

## 2016-09-08 MED FILL — DESCOVY 200-25 MG TABS: 200-25 | 30 days supply | Qty: 30 | Fill #0

## 2016-09-08 MED FILL — TIVICAY 50 MG TABLET: 50 | 30 days supply | Qty: 30 | Fill #0

## 2016-09-13 DIAGNOSIS — Z940 Kidney transplant status: Secondary | ICD-10-CM | POA: Diagnosis not present

## 2016-09-25 DIAGNOSIS — M7712 Lateral epicondylitis, left elbow: Secondary | ICD-10-CM | POA: Diagnosis not present

## 2016-10-12 MED FILL — DESCOVY 200-25 MG TABS: 200-25 | 30 days supply | Qty: 30 | Fill #1

## 2016-10-12 MED FILL — TIVICAY 50 MG TABLET: 50 | 30 days supply | Qty: 30 | Fill #1

## 2016-10-25 ENCOUNTER — Encounter: Payer: Medicare Other | Admitting: Family

## 2016-11-04 DIAGNOSIS — Z940 Kidney transplant status: Secondary | ICD-10-CM | POA: Diagnosis not present

## 2016-11-04 DIAGNOSIS — D631 Anemia in chronic kidney disease: Secondary | ICD-10-CM | POA: Diagnosis not present

## 2016-11-15 MED FILL — TIVICAY 50 MG TABLET: 50 | 30 days supply | Qty: 30 | Fill #2

## 2016-11-15 MED FILL — DESCOVY 200-25 MG TABS: 200-25 | 30 days supply | Qty: 30 | Fill #2

## 2016-12-01 ENCOUNTER — Encounter: Payer: Self-pay | Admitting: Internal Medicine

## 2016-12-01 ENCOUNTER — Ambulatory Visit (INDEPENDENT_AMBULATORY_CARE_PROVIDER_SITE_OTHER): Payer: Medicare Other | Admitting: Internal Medicine

## 2016-12-01 DIAGNOSIS — B1920 Unspecified viral hepatitis C without hepatic coma: Secondary | ICD-10-CM

## 2016-12-01 DIAGNOSIS — Z940 Kidney transplant status: Secondary | ICD-10-CM | POA: Diagnosis not present

## 2016-12-01 DIAGNOSIS — B20 Human immunodeficiency virus [HIV] disease: Secondary | ICD-10-CM | POA: Diagnosis not present

## 2016-12-01 LAB — CBC
HCT: 42.7 % (ref 38.5–50.0)
HEMOGLOBIN: 14.2 g/dL (ref 13.2–17.1)
MCH: 33.3 pg — ABNORMAL HIGH (ref 27.0–33.0)
MCHC: 33.3 g/dL (ref 32.0–36.0)
MCV: 100 fL (ref 80.0–100.0)
MPV: 8.8 fL (ref 7.5–12.5)
PLATELETS: 194 10*3/uL (ref 140–400)
RBC: 4.27 MIL/uL (ref 4.20–5.80)
RDW: 13.7 % (ref 11.0–15.0)
WBC: 4.4 10*3/uL (ref 3.8–10.8)

## 2016-12-01 LAB — COMPREHENSIVE METABOLIC PANEL WITH GFR
ALT: 8 U/L — ABNORMAL LOW (ref 9–46)
AST: 17 U/L (ref 10–35)
Albumin: 3.9 g/dL (ref 3.6–5.1)
Alkaline Phosphatase: 59 U/L (ref 40–115)
BUN: 20 mg/dL (ref 7–25)
CO2: 23 mmol/L (ref 20–31)
Calcium: 7.4 mg/dL — ABNORMAL LOW (ref 8.6–10.3)
Chloride: 103 mmol/L (ref 98–110)
Creat: 1.93 mg/dL — ABNORMAL HIGH (ref 0.70–1.33)
Glucose, Bld: 99 mg/dL (ref 65–99)
Potassium: 4.4 mmol/L (ref 3.5–5.3)
Sodium: 138 mmol/L (ref 135–146)
Total Bilirubin: 0.5 mg/dL (ref 0.2–1.2)
Total Protein: 6.7 g/dL (ref 6.1–8.1)

## 2016-12-01 LAB — LIPID PANEL
CHOL/HDL RATIO: 2 ratio (ref <5.0)
CHOLESTEROL: 127 mg/dL (ref <200)
HDL: 64 mg/dL (ref >40)
LDL Cholesterol: 49 mg/dL (ref <100)
Triglycerides: 68 mg/dL (ref <150)
VLDL: 14 mg/dL (ref <30)

## 2016-12-01 NOTE — Progress Notes (Signed)
Patient Active Problem List   Diagnosis Date Noted  . Human immunodeficiency virus (HIV) disease (HCC) 05/10/1999    Priority: High  . Hepatitis C virus infection without hepatic coma 03/15/2006    Priority: Medium  . Medicare annual wellness visit, subsequent 03/23/2015  . Other specified anxiety disorders 07/18/2014  . Chronic insomnia 07/18/2014  . RA (retrograde amnesia) 07/18/2014  . TBI (traumatic brain injury) (HCC) 07/18/2014  . Post concussive syndrome 05/13/2014  . 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 Essential (primary) hypertension 06/06/2011  . KIDNEY TRANSPLANTATION 07/11/2007  . SYPHILIS 03/15/2006  . HYDROCELE NOS 03/15/2006  . SEIZURE DISORDER 03/15/2006  . ALCOHOL ABUSE, HX OF 03/15/2006  . HEPATITIS B, HX OF 03/15/2006  . TOBACCO USE, QUIT 03/15/2006  . PARATHYROIDECTOMY 08/08/2002  . CEREBROVASCULAR ACCIDENT, ACUTE 10/08/1999    Patient's Medications  New Prescriptions   No medications on file  Previous Medications   ACETAMINOPHEN (TYLENOL) 325 MG TABLET    Take 1 tablet (325 mg total) by mouth every 6 (six) hours as needed for mild pain or moderate pain (pain).   CELLCEPT 250 MG CAPSULE    Take 250 mg by mouth 2 (two) times daily.   CETIRIZINE (ZYRTEC ALLERGY) 10 MG TABLET    Take 1 tablet (10 mg total) by mouth daily.   DOLUTEGRAVIR (TIVICAY) 50 MG TABLET    Take 1 tablet (50 mg total) by mouth daily.   EMTRICITABINE-TENOFOVIR AF (DESCOVY) 200-25 MG TABLET    Take 1 tablet by mouth daily.   ERYTHROMYCIN OPHTHALMIC OINTMENT    Place a 1/2 inch ribbon of ointment into the lower eyelid every 8 hrs while awake for 5 days   LATANOPROST (XALATAN) 0.005 % OPHTHALMIC SOLUTION       LORAZEPAM (ATIVAN) 1 MG TABLET    Take 1 tablet (1 mg total) by mouth at bedtime.   PREDNISONE (DELTASONE) 5 MG TABLET    Take 5 mg by mouth daily. Take on Monday, Wednesday and Friday nights   TACROLIMUS (PROGRAF) 1 MG CAPSULE    Take 1 mg by mouth 2 (two) times  daily. Take with 5 mg tablet for a 6 mg dose   TACROLIMUS (PROGRAF) 5 MG CAPSULE    Take 5 mg by mouth 2 (two) times daily. Take with 1 mg tablet for a 6 mg dose  Modified Medications   No medications on file  Discontinued Medications   No medications on file    Subjective: Luis Reed is in for his routine HIV follow-up visit. He has had no problems obtaining, taking or tolerating Descovy and Tivicay. He says that he never misses any doses. He has not changed any of his other medications. He would like to be seen in the dental clinic. To his front upper teeth are bothering him.   Review of Systems: Review of Systems  Constitutional: Negative for chills, diaphoresis, fever, malaise/fatigue and weight loss.  HENT: Negative for sore throat.        As noted in history of present illness.  Respiratory: Negative for cough, sputum production and shortness of breath.   Cardiovascular: Negative for chest pain.  Gastrointestinal: Negative for abdominal pain, diarrhea, heartburn, nausea and vomiting.  Genitourinary: Negative for dysuria and frequency.  Musculoskeletal: Negative for joint pain and myalgias.  Skin: Negative for rash.  Neurological: Negative for dizziness and headaches.  Psychiatric/Behavioral: Negative for depression and substance abuse. The patient is not nervous/anxious.  Past Medical History:  Diagnosis Date  . Hepatitis C   . HIV infection (HCC)   . Renal disorder renal failure    Social History  Substance Use Topics  . Smoking status: Former Smoker    Packs/day: 0.30    Years: 20.00  . Smokeless tobacco: Never Used  . Alcohol use No    Family History  Problem Relation Age of Onset  . Prostate cancer Father     Allergies  Allergen Reactions  . Hydrocodone Itching and Other (See Comments)    fidgety    Objective:  Vitals:   12/01/16 0853  BP: 131/71  Pulse: 60  Temp: (!) 97 F (36.1 C)  TempSrc: Oral  Weight: 160 lb (72.6 kg)   Body mass index is  24.33 kg/m.  Physical Exam  Constitutional: He is oriented to person, place, and time.  He is in good spirits.  HENT:  Mouth/Throat: No oropharyngeal exudate.  Some discoloration of 2 front, upper teeth.  Eyes: Conjunctivae are normal.  Cardiovascular: Normal rate and regular rhythm.   No murmur heard. Pulmonary/Chest: Effort normal and breath sounds normal.  Abdominal: Soft. He exhibits no mass. There is no tenderness.  Musculoskeletal: Normal range of motion.  Neurological: He is alert and oriented to person, place, and time.  Skin: No rash noted.  Psychiatric: Mood and affect normal.    Lab Results Lab Results  Component Value Date   WBC 4.4 09/16/2015   HGB 13.7 09/16/2015   HCT 41.6 09/16/2015   MCV 100.2 (H) 09/16/2015   PLT 181 09/16/2015    Lab Results  Component Value Date   CREATININE 1.98 (H) 09/16/2015   BUN 19 09/16/2015   NA 139 09/16/2015   K 4.5 09/16/2015   CL 102 09/16/2015   CO2 28 09/16/2015    Lab Results  Component Value Date   ALT 9 09/16/2015   AST 13 09/16/2015   ALKPHOS 65 09/16/2015   BILITOT 0.6 09/16/2015    Lab Results  Component Value Date   CHOL 128 03/23/2015   HDL 62.90 03/23/2015   LDLCALC 54 03/23/2015   TRIG 52.0 03/23/2015   CHOLHDL 2 03/23/2015   Lab Results  Component Value Date   LABRPR NON REAC 09/16/2015   HIV 1 RNA Quant (copies/mL)  Date Value  09/16/2015 <20  11/24/2014 <20  07/14/2014 26 (H)   CD4 T Cell Abs (/uL)  Date Value  09/16/2015 730  11/24/2014 450  07/14/2014 630     Problem List Items Addressed This Visit      High   Human immunodeficiency virus (HIV) disease (HCC)    His infection has been under excellent long-term control. He will continue Descovy and Tivicay get repeat blood work today. He will follow-up in one year.      Relevant Orders   T-helper cell (CD4)- ([facility] clinic only)   HIV 1 RNA quant-no reflex-bld   CBC   Comprehensive metabolic panel   RPR   Lipid panel      Medium   Hepatitis C virus infection without hepatic coma    He completed therapy with Harvoni 2 years ago. He was undetectable during treatment. I will repeat a viral load today.      Relevant Orders   Hepatitis C RNA quantitative     Unprioritized   KIDNEY TRANSPLANTATION    He has had some chronic renal insufficiency following renal transplant. I will recheck his renal function today. He has follow-up with  his nephrologist, Dr. Abel Prestoolodonato next week.           Cliffton AstersJohn Mckala Pantaleon, MD [facility] [phone] pager   [phone] cell 12/01/2016, 9:10 AM

## 2016-12-01 NOTE — Assessment & Plan Note (Signed)
He has had some chronic renal insufficiency following renal transplant. I will recheck his renal function today. He has follow-up with his nephrologist, Dr. Abel Prestoolodonato next week.

## 2016-12-01 NOTE — Assessment & Plan Note (Signed)
His infection has been under excellent long-term control. He will continue Descovy and Tivicay get repeat blood work today. He will follow-up in one year.

## 2016-12-01 NOTE — Assessment & Plan Note (Signed)
He completed therapy with Harvoni 2 years ago. He was undetectable during treatment. I will repeat a viral load today.

## 2016-12-02 LAB — T-HELPER CELL (CD4) - (RCID CLINIC ONLY)
CD4 % Helper T Cell: 31 % — ABNORMAL LOW (ref 33–55)
CD4 T CELL ABS: 810 /uL (ref 400–2700)

## 2016-12-02 LAB — RPR

## 2016-12-06 LAB — HIV-1 RNA QUANT-NO REFLEX-BLD
HIV 1 RNA QUANT: NOT DETECTED {copies}/mL
HIV-1 RNA QUANT, LOG: NOT DETECTED {Log_copies}/mL

## 2016-12-06 LAB — HEPATITIS C RNA QUANTITATIVE
HCV Quantitative Log: <1.18 Log IU/mL
HCV Quantitative: <15 IU/mL

## 2016-12-14 MED FILL — TIVICAY 50 MG TABLET: 50 | 30 days supply | Qty: 30 | Fill #3

## 2016-12-14 MED FILL — DESCOVY 200-25 MG TABS: 200-25 | 30 days supply | Qty: 30 | Fill #3

## 2016-12-26 ENCOUNTER — Ambulatory Visit (INDEPENDENT_AMBULATORY_CARE_PROVIDER_SITE_OTHER): Payer: Medicare Other

## 2016-12-26 ENCOUNTER — Ambulatory Visit (INDEPENDENT_AMBULATORY_CARE_PROVIDER_SITE_OTHER): Payer: Medicare Other | Admitting: Podiatry

## 2016-12-26 ENCOUNTER — Encounter: Payer: Self-pay | Admitting: Podiatry

## 2016-12-26 DIAGNOSIS — M7732 Calcaneal spur, left foot: Secondary | ICD-10-CM

## 2016-12-26 DIAGNOSIS — M722 Plantar fascial fibromatosis: Secondary | ICD-10-CM | POA: Diagnosis not present

## 2016-12-26 MED ORDER — BETAMETHASONE SOD PHOS & ACET 6 (3-3) MG/ML IJ SUSP: 3.0000 mg | Freq: Once | INTRAMUSCULAR | Status: DC

## 2016-12-26 NOTE — Progress Notes (Signed)
Patient ID: Luis Reed, male   DOB: 16-May-1960, 56 y.o.   MRN: [id number]   Subjective: Patient presents today for pain and tenderness in the left foot. Patient states the foot pain has been hurting for several weeks now. Patient states that it hurts in the mornings with the first steps out of bed. Patient presents today for further treatment and evaluation  Objective: Physical Exam General: The patient is alert and oriented x3 in no acute distress.  Dermatology: Skin is warm, dry and supple bilateral lower extremities. Negative for open lesions or macerations bilateral.   Vascular: Dorsalis Pedis and Posterior Tibial pulses palpable bilateral.  Capillary fill time is immediate to all digits.  Neurological: Epicritic and protective threshold intact bilateral.   Musculoskeletal: Tenderness to palpation at the medial calcaneal tubercale and through the insertion of the plantar fascia of the left foot. All other joints range of motion within normal limits bilateral. Strength 5/5 in all groups bilateral.   Radiographic exam:   Normal osseous mineralization. Joint spaces preserved. There does appear to be a small cortical irregularity along the plantar calcaneal spur possibly indicated of a nondisplaced fracture of the calcaneal spur. Calcaneal spur present with mild thickening of plantar fascia left. No other soft tissue abnormalities or radiopaque foreign bodies.   Assessment: 1. Plantar fasciitis left foot  Plan of Care:  1. Patient evaluated. Xrays reviewed.   2. Injection of 0.5cc Celestone soluspan injected into the left plantar fascia.  3.Plantar fascial band(s) dispensed  5. Instructed patient regarding therapies and modalities at home to alleviate symptoms.  6. Return to clinic in 4 weeks.    Patient is a roofer/construction  Felecia Shelling, DPM [facility]  Dr. Felecia Shelling, DPM    [address] [place], [place]  [address]                Office [phone]  Fax [phone]

## 2016-12-26 NOTE — Progress Notes (Signed)
   Subjective:    Patient ID: Luis Reed, male    DOB: [birth year], 58 y.o.   MRN: [id number]  HPI   I have been having pain in my left heel for about 2 weeks.      Review of Systems  All other systems reviewed and are negative.      Objective:   Physical Exam        Assessment & Plan:

## 2017-01-02 ENCOUNTER — Encounter: Payer: Self-pay | Admitting: Internal Medicine

## 2017-01-05 ENCOUNTER — Other Ambulatory Visit: Payer: Self-pay | Admitting: Internal Medicine

## 2017-01-05 DIAGNOSIS — B20 Human immunodeficiency virus [HIV] disease: Secondary | ICD-10-CM

## 2017-01-09 MED FILL — DESCOVY 200-25 MG TABS: 200-25 | 30 days supply | Qty: 30 | Fill #0

## 2017-01-09 MED FILL — TIVICAY 50 MG TABLET: 50 | 30 days supply | Qty: 30 | Fill #0

## 2017-01-11 DIAGNOSIS — Z940 Kidney transplant status: Secondary | ICD-10-CM | POA: Diagnosis not present

## 2017-01-11 DIAGNOSIS — N2581 Secondary hyperparathyroidism of renal origin: Secondary | ICD-10-CM | POA: Diagnosis not present

## 2017-02-03 MED FILL — TIVICAY 50 MG TABLET: 50 | 30 days supply | Qty: 30 | Fill #1

## 2017-02-03 MED FILL — DESCOVY 200-25 MG TABS: 200-25 | 30 days supply | Qty: 30 | Fill #1

## 2017-02-27 DIAGNOSIS — D631 Anemia in chronic kidney disease: Secondary | ICD-10-CM | POA: Diagnosis not present

## 2017-02-27 DIAGNOSIS — Z940 Kidney transplant status: Secondary | ICD-10-CM | POA: Diagnosis not present

## 2017-03-02 MED FILL — TIVICAY 50 MG TABLET: 50 | 30 days supply | Qty: 30 | Fill #2

## 2017-03-02 MED FILL — DESCOVY 200-25 MG TABS: 200-25 | 30 days supply | Qty: 30 | Fill #2

## 2017-03-29 MED FILL — TIVICAY 50 MG TABLET: 50 | 30 days supply | Qty: 30 | Fill #3

## 2017-03-29 MED FILL — DESCOVY 200-25 MG TABS: 200-25 | 30 days supply | Qty: 30 | Fill #3

## 2017-04-01 ENCOUNTER — Emergency Department (HOSPITAL_COMMUNITY): Payer: Medicare Other

## 2017-04-01 ENCOUNTER — Emergency Department (HOSPITAL_COMMUNITY)
Admission: EM | Admit: 2017-04-01 | Discharge: 2017-04-01 | Disposition: A | Discharge status: Home / Self Care | Payer: Medicare Other | Attending: Emergency Medicine | Admitting: Emergency Medicine

## 2017-04-01 ENCOUNTER — Other Ambulatory Visit: Payer: Self-pay

## 2017-04-01 ENCOUNTER — Encounter (HOSPITAL_COMMUNITY): Payer: Self-pay

## 2017-04-01 DIAGNOSIS — B1920 Unspecified viral hepatitis C without hepatic coma: Secondary | ICD-10-CM | POA: Insufficient documentation

## 2017-04-01 DIAGNOSIS — Z87891 Personal history of nicotine dependence: Secondary | ICD-10-CM | POA: Insufficient documentation

## 2017-04-01 DIAGNOSIS — M25572 Pain in left ankle and joints of left foot: Secondary | ICD-10-CM

## 2017-04-01 DIAGNOSIS — Z79811 Long term (current) use of aromatase inhibitors: Secondary | ICD-10-CM | POA: Diagnosis not present

## 2017-04-01 DIAGNOSIS — Z885 Allergy status to narcotic agent status: Secondary | ICD-10-CM | POA: Diagnosis not present

## 2017-04-01 DIAGNOSIS — Z940 Kidney transplant status: Secondary | ICD-10-CM | POA: Diagnosis not present

## 2017-04-01 DIAGNOSIS — Z79899 Other long term (current) drug therapy: Secondary | ICD-10-CM | POA: Diagnosis not present

## 2017-04-01 DIAGNOSIS — Z21 Asymptomatic human immunodeficiency virus [HIV] infection status: Secondary | ICD-10-CM | POA: Diagnosis not present

## 2017-04-01 NOTE — ED Notes (Signed)
Pt taken to xray 

## 2017-04-01 NOTE — Discharge Instructions (Signed)
Here are your x-ray results   EXAM:  LEFT ANKLE COMPLETE - 3+ VIEW     COMPARISON:  None.     FINDINGS:  There is no evidence of acute fracture, dislocation, or joint  effusion. A few tiny ossific densities are seen along the inferior  aspect of the medial malleolus, which may be due to old avulsion  injuries or tiny accessory ossicles. There is no evidence of ankle  joint arthropathy. Small plantar calcaneal bone spur noted.     IMPRESSION:  No acute findings.        Electronically Signed    By: Myles RosenthalJohn  Stahl M.D.    On: 04/01/2017 08:48   There is no evidence of new bony injury, fracture, dislocation. There are tiny opaque densities in the area where you have pain that may be from an old injury. It is still unclear what is causing your pain, it might be a soft tissue like ligament.  Please take tylenol or ibuprofen for pain. You can take up to 1000 mg tylenol PLUS 600 mg ibuprofen every 8 hours for pain. Rest. Ice. Elevate.   Follow up with podiatrist within a week if pain does not improve.    Return to ED if you develop swelling, redness, warmth, numbness or tingling to ankle.

## 2017-04-01 NOTE — ED Triage Notes (Signed)
Patient complains of left ankle pain x 1 day, denies trauma, pain with rest and ambulation, NAD

## 2017-04-01 NOTE — ED Provider Notes (Signed)
[facility] Provider Note   CSN: [id number] Arrival date & time: 04/01/17  [id number]     History   Chief Complaint No chief complaint on file.   HPI Luis HagemanWalter L Reed is a 56 y.o. male with history of HIV (CD4 count 810 on 11/2016), treated hepatitis C, renal transplant, left plantar fasciitis presents to the ED for sudden onset, persistent left medial ankle and heel pain onset yesterday. It is worse with weightbearing, range of motion and touch. Slightly alleviated with rest. Has not tried anything over-the-counter for the symptoms. No associated rash, erythema, edema, warmth over left ankle.  No numbness or tingling to toes. He denies history of gout but states that runs in the family. Denies IV drug use or history of septic arthritis in the past. Was evaluated by podiatrist for left sole pain months ago and diagnosed with plantar fascitis, states pain today is different location.   HPI  Past Medical History:  Diagnosis Date  . Hepatitis C   . HIV infection (HCC)   . Renal disorder renal failure    Patient Active Problem List   Diagnosis Date Noted  . Medicare annual wellness visit, subsequent 03/23/2015  . Other specified anxiety disorders 07/18/2014  . Chronic insomnia 07/18/2014  . RA (retrograde amnesia) 07/18/2014  . TBI (traumatic brain injury) (HCC) 07/18/2014  . Post concussive syndrome 05/13/2014  . 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 Essential (primary) hypertension 06/06/2011  . KIDNEY TRANSPLANTATION 07/11/2007  . Hepatitis C virus infection without hepatic coma 03/15/2006  . SYPHILIS 03/15/2006  . HYDROCELE NOS 03/15/2006  . SEIZURE DISORDER 03/15/2006  . ALCOHOL ABUSE, HX OF 03/15/2006  . HEPATITIS B, HX OF 03/15/2006  . TOBACCO USE, QUIT 03/15/2006  . PARATHYROIDECTOMY 08/08/2002  . CEREBROVASCULAR ACCIDENT, ACUTE 10/08/1999  . Human immunodeficiency virus (HIV) disease (HCC) 05/10/1999    Past Surgical  History:  Procedure Laterality Date  . NEPHRECTOMY TRANSPLANTED ORGAN         Home Medications    Prior to Admission medications   Medication Sig Start Date End Date Taking? Authorizing Provider  acetaminophen (TYLENOL) 325 MG tablet Take 1 tablet (325 mg total) by mouth every 6 (six) hours as needed for mild pain or moderate pain (pain). Patient not taking: Reported on 12/01/2016 04/12/13   PiepenbrinkVictorino Dike, Jennifer, PA-C  CELLCEPT 250 MG capsule Take 250 mg by mouth 2 (two) times daily. 01/29/14   [provider]  cetirizine (ZYRTEC ALLERGY) 10 MG tablet Take 1 tablet (10 mg total) by mouth daily. Patient not taking: Reported on 12/26/2016 09/09/14   Roxy HorsemanBrowning, Robert, PA-C  DESCOVY 200-25 MG tablet TAKE 1 TABLET BY MOUTH DAILY. 01/05/17   Cliffton Astersampbell, John, MD  erythromycin ophthalmic ointment Place a 1/2 inch ribbon of ointment into the lower eyelid every 8 hrs while awake for 5 days Patient not taking: Reported on 12/01/2016 09/26/15   Fayrene Helperran, Bowie, PA-C  latanoprost (XALATAN) 0.005 % ophthalmic solution  06/03/14   [provider]  LORazepam (ATIVAN) 1 MG tablet Take 1 tablet (1 mg total) by mouth at bedtime. Patient not taking: Reported on 12/01/2016 07/18/14   Dohmeier, Porfirio Mylararmen, MD  predniSONE (DELTASONE) 5 MG tablet Take 5 mg by mouth daily. Take on Monday, Wednesday and Friday nights    [provider]  tacrolimus (PROGRAF) 1 MG capsule Take 1 mg by mouth 2 (two) times daily. Take with 5 mg tablet for a 6 mg dose    [provider]  tacrolimus (PROGRAF) 5 MG capsule Take 5 mg by mouth 2 (two) times daily. Take with 1 mg tablet for a 6 mg dose    [provider]  TIVICAY 50 MG tablet TAKE 1 TABLET BY MOUTH DAILY. 01/05/17   Cliffton Astersampbell, John, MD    Family History Family History  Problem Relation Age of Onset  . Prostate cancer Father     Social History Social History   Tobacco Use  . Smoking status: Former Smoker    Packs/day: 0.30    Years: 20.00      Pack years: 6.00  . Smokeless tobacco: Never Used  Substance Use Topics  . Alcohol use: No  . Drug use: No     Allergies   Hydrocodone   Review of Systems Review of Systems  Musculoskeletal: Positive for arthralgias.  All other systems reviewed and are negative.    Physical Exam Updated Vital Signs BP 118/72   Pulse 78   Temp 98 F (36.7 C) (Oral)   Resp 18   SpO2 100%   Physical Exam  Constitutional: He is oriented to person, place, and time. He appears well-developed and well-nourished. No distress.  NAD.  HENT:  Head: Normocephalic and atraumatic.  Right Ear: External ear normal.  Left Ear: External ear normal.  Nose: Nose normal.  Eyes: Conjunctivae and EOM are normal. No scleral icterus.  Neck: Normal range of motion. Neck supple.  Cardiovascular: Normal rate, regular rhythm, normal heart sounds and intact distal pulses.  No murmur heard. <2 cap refill to left toes 2+ DP and PT pulses bilaterally   Pulmonary/Chest: Effort normal and breath sounds normal. He has no wheezes.  Musculoskeletal: Normal range of motion. He exhibits tenderness. He exhibits no deformity.  Focal tenderness below and to left medial malleoli No edema, erythema, warmth, fluctuance or cellulitis over left ankle or foot Left calf is soft and non tender Full PROM of left ankle with mild pain only   Neurological: He is alert and oriented to person, place, and time.  Skin: Skin is warm and dry. Capillary refill takes less than 2 seconds.  Normal skin over left ankle and foot   Psychiatric: He has a normal mood and affect. His behavior is normal. Judgment and thought content normal.  Nursing note and vitals reviewed.    ED Treatments / Results  Labs (all labs ordered are listed, but only abnormal results are displayed) Labs Reviewed - No data to display  EKG  EKG Interpretation None       Radiology Dg Ankle Complete Left  Result Date: 04/01/2017 CLINICAL DATA:  Left  ankle pain for 1 day.  No known injury. EXAM: LEFT ANKLE COMPLETE - 3+ VIEW COMPARISON:  None. FINDINGS: There is no evidence of acute fracture, dislocation, or joint effusion. A few tiny ossific densities are seen along the inferior aspect of the medial malleolus, which may be due to old avulsion injuries or tiny accessory ossicles. There is no evidence of ankle joint arthropathy. Small plantar calcaneal bone spur noted. IMPRESSION: No acute findings. Electronically Signed   By: Myles RosenthalJohn  Stahl M.D.   On: 04/01/2017 08:48    Procedures Procedures (including critical care time)  Medications Ordered in ED Medications - No data to display   Initial Impression / Assessment and Plan / ED Course  I have reviewed the triage vital signs and the nursing notes.  Pertinent labs & imaging results that were available during my care of the patient  were reviewed by me and considered in my medical decision making (see chart for details).     56 y.o. year old male with h/o significant for  HIV (CD4 count 810 on 11/2016), treated hepatitis C, renal transplant, left plantar fasciitis presents to ED for atraumatic left ankle pain x 1 day.  No fevers or chills. No h/o IVDU or gout. Not a prosthetic joint. No preceding trauma, URI or GI illness. On exam, there is focal tenderness to left medial malleoli. No warmth, fluctuance or evidence of overlaying cellulitis. Only mild pain with full ROM. Initial differential diagnosis includes septic arthritis, reactive arthritis, gout, however higher suspicion for OA or soft tissue injury given exam and symptomatology. No h/o high risk sexual practices, generalized rash or GU symptoms to raise suspicion for gonococcal arthritis. X-ray w/o acute injury. Will d/c with conservative management and podiatry f/u. He has care established with Dr. Logan Bores (podiatry). Discussed s/s that would warrant return to ED. Pt verbalized understanding and agreeable with plan.    Final Clinical  Impressions(s) / ED Diagnoses   Final diagnoses:  Acute left ankle pain    ED Discharge Orders    None       Liberty Handy, PA-C 04/01/17 1191    Lavera Guise, MD 04/01/17 1626

## 2017-04-11 DIAGNOSIS — Z949 Transplanted organ and tissue status, unspecified: Secondary | ICD-10-CM | POA: Diagnosis not present

## 2017-04-12 ENCOUNTER — Ambulatory Visit: Payer: Medicare Other | Admitting: Podiatry

## 2017-04-12 ENCOUNTER — Encounter: Payer: Self-pay | Admitting: Podiatry

## 2017-04-12 DIAGNOSIS — M722 Plantar fascial fibromatosis: Secondary | ICD-10-CM

## 2017-04-12 MED ORDER — METHYLPREDNISOLONE 4 MG PO TBPK: ORAL_TABLET | ORAL | 0 refills | Status: DC

## 2017-04-12 MED ORDER — MELOXICAM 15 MG PO TABS: 15.0000 mg | ORAL_TABLET | Freq: Every day | ORAL | 1 refills | Status: AC

## 2017-04-16 NOTE — Progress Notes (Signed)
   Subjective: 56 year old male presenting today for follow-up evaluation of left plantar fasciitis.  He reports continued pain in the left foot.  He states he went to the ED last Saturday for treatment of the pain and was instructed to follow-up here.  He reports no significant relief after receiving the injection.  Patient presents today for further treatment and evaluation.  Past Medical History:  Diagnosis Date  . Hepatitis C   . HIV infection (HCC)   . Renal disorder renal failure     Objective: Physical Exam General: The patient is alert and oriented x3 in no acute distress.  Dermatology: Skin is warm, dry and supple bilateral lower extremities. Negative for open lesions or macerations bilateral.   Vascular: Dorsalis Pedis and Posterior Tibial pulses palpable bilateral.  Capillary fill time is immediate to all digits.  Neurological: Epicritic and protective threshold intact bilateral.   Musculoskeletal: Tenderness to palpation at the medial calcaneal tubercale and through the insertion of the plantar fascia of the left foot. All other joints range of motion within normal limits bilateral. Strength 5/5 in all groups bilateral.    Assessment: 1. Plantar fasciitis left foot  Plan of Care:  1. Patient evaluated.   2. Injection of 0.5cc Celestone soluspan injected into the left plantar fascia.  3.  Plantar fascia brace dispensed. 4.  Prescription for Medrol Dosepak provided to patient. 5.  Prescription for meloxicam provided to patient. 6.  Return to clinic in 4 weeks.   Felecia ShellingBrent M. Evans, DPM [facility]  Dr. Felecia ShellingBrent M. Evans, DPM    [address]                                     [place], [place] [id number]                Office [phone]  Fax [phone]

## 2017-05-03 MED FILL — DESCOVY 200-25 MG TABS: 200-25 | 30 days supply | Qty: 30 | Fill #4

## 2017-05-03 MED FILL — TIVICAY 50 MG TABLET: 50 | 30 days supply | Qty: 30 | Fill #4

## 2017-05-10 ENCOUNTER — Encounter: Payer: Medicare Other | Admitting: Podiatry

## 2017-05-16 NOTE — Progress Notes (Signed)
This encounter was created in error - please disregard.

## 2017-05-17 DIAGNOSIS — Z940 Kidney transplant status: Secondary | ICD-10-CM | POA: Diagnosis not present

## 2017-05-17 DIAGNOSIS — R8279 Other abnormal findings on microbiological examination of urine: Secondary | ICD-10-CM | POA: Diagnosis not present

## 2017-05-17 DIAGNOSIS — T8611 Kidney transplant rejection: Secondary | ICD-10-CM | POA: Diagnosis not present

## 2017-05-17 DIAGNOSIS — N2581 Secondary hyperparathyroidism of renal origin: Secondary | ICD-10-CM | POA: Diagnosis not present

## 2017-06-02 MED FILL — TIVICAY 50 MG TABLET: 50 | 30 days supply | Qty: 30 | Fill #5

## 2017-06-02 MED FILL — DESCOVY 200-25 MG TABS: 200-25 | 30 days supply | Qty: 30 | Fill #5

## 2017-06-06 ENCOUNTER — Telehealth: Payer: Self-pay | Admitting: *Deleted

## 2017-06-06 NOTE — Telephone Encounter (Signed)
Request of #90 Meloxicam. Dr. Logan BoresEvans states fill as ordered, pt needs another appt. Return fax denying.

## 2017-06-12 DIAGNOSIS — Z940 Kidney transplant status: Secondary | ICD-10-CM | POA: Diagnosis not present

## 2017-06-12 DIAGNOSIS — D631 Anemia in chronic kidney disease: Secondary | ICD-10-CM | POA: Diagnosis not present

## 2017-06-26 ENCOUNTER — Other Ambulatory Visit: Payer: Self-pay | Admitting: Internal Medicine

## 2017-06-26 DIAGNOSIS — B20 Human immunodeficiency virus [HIV] disease: Secondary | ICD-10-CM

## 2017-06-28 MED FILL — DESCOVY 200-25 MG TABS: 200-25 | 30 days supply | Qty: 30 | Fill #0

## 2017-06-28 MED FILL — TIVICAY 50 MG TABLET: 50 | 30 days supply | Qty: 30 | Fill #0

## 2017-07-27 DIAGNOSIS — N2581 Secondary hyperparathyroidism of renal origin: Secondary | ICD-10-CM | POA: Diagnosis not present

## 2017-07-27 DIAGNOSIS — Z940 Kidney transplant status: Secondary | ICD-10-CM | POA: Diagnosis not present

## 2017-07-27 DIAGNOSIS — D631 Anemia in chronic kidney disease: Secondary | ICD-10-CM | POA: Diagnosis not present

## 2017-08-03 MED FILL — TIVICAY 50 MG TABLET: 50 | 30 days supply | Qty: 30 | Fill #1

## 2017-08-03 MED FILL — DESCOVY 200-25 MG TABS: 200-25 | 30 days supply | Qty: 30 | Fill #1

## 2017-08-04 ENCOUNTER — Other Ambulatory Visit: Payer: Self-pay | Admitting: Pharmacist Clinician (PhC)/ Clinical Pharmacy Specialist

## 2017-08-04 DIAGNOSIS — B20 Human immunodeficiency virus [HIV] disease: Secondary | ICD-10-CM

## 2017-08-12 ENCOUNTER — Encounter (HOSPITAL_COMMUNITY): Payer: Self-pay | Admitting: Emergency Medicine

## 2017-08-12 ENCOUNTER — Other Ambulatory Visit: Payer: Self-pay

## 2017-08-12 ENCOUNTER — Emergency Department (HOSPITAL_COMMUNITY)
Admission: EM | Admit: 2017-08-12 | Discharge: 2017-08-12 | Disposition: A | Discharge status: Home / Self Care | Payer: Medicare Other | Attending: Emergency Medicine | Admitting: Emergency Medicine

## 2017-08-12 DIAGNOSIS — X500XXA Overexertion from strenuous movement or load, initial encounter: Secondary | ICD-10-CM | POA: Diagnosis not present

## 2017-08-12 DIAGNOSIS — Y999 Unspecified external cause status: Secondary | ICD-10-CM | POA: Diagnosis not present

## 2017-08-12 DIAGNOSIS — Y929 Unspecified place or not applicable: Secondary | ICD-10-CM | POA: Diagnosis not present

## 2017-08-12 DIAGNOSIS — Z79899 Other long term (current) drug therapy: Secondary | ICD-10-CM | POA: Diagnosis not present

## 2017-08-12 DIAGNOSIS — Z940 Kidney transplant status: Secondary | ICD-10-CM | POA: Insufficient documentation

## 2017-08-12 DIAGNOSIS — B20 Human immunodeficiency virus [HIV] disease: Secondary | ICD-10-CM | POA: Insufficient documentation

## 2017-08-12 DIAGNOSIS — Y9389 Activity, other specified: Secondary | ICD-10-CM | POA: Insufficient documentation

## 2017-08-12 DIAGNOSIS — S39012A Strain of muscle, fascia and tendon of lower back, initial encounter: Secondary | ICD-10-CM | POA: Diagnosis not present

## 2017-08-12 DIAGNOSIS — M545 Low back pain: Secondary | ICD-10-CM | POA: Diagnosis present

## 2017-08-12 DIAGNOSIS — Z87891 Personal history of nicotine dependence: Secondary | ICD-10-CM | POA: Insufficient documentation

## 2017-08-12 LAB — URINALYSIS, ROUTINE W REFLEX MICROSCOPIC
Bilirubin Urine: NEGATIVE
GLUCOSE, UA: NEGATIVE mg/dL
Hgb urine dipstick: NEGATIVE
Ketones, ur: NEGATIVE mg/dL
LEUKOCYTES UA: NEGATIVE
Nitrite: NEGATIVE
PROTEIN: NEGATIVE mg/dL
SPECIFIC GRAVITY, URINE: 1.02 (ref 1.005–1.030)
pH: 6 (ref 5.0–8.0)

## 2017-08-12 LAB — COMPREHENSIVE METABOLIC PANEL
ALT: 12 U/L — AB (ref 17–63)
ANION GAP: 9 (ref 5–15)
AST: 17 U/L (ref 15–41)
Albumin: 3.9 g/dL (ref 3.5–5.0)
Alkaline Phosphatase: 55 U/L (ref 38–126)
BUN: 27 mg/dL — ABNORMAL HIGH (ref 6–20)
CO2: 25 mmol/L (ref 22–32)
CREATININE: 1.77 mg/dL — AB (ref 0.61–1.24)
Calcium: 8.2 mg/dL — ABNORMAL LOW (ref 8.9–10.3)
Chloride: 106 mmol/L (ref 101–111)
GFR, EST AFRICAN AMERICAN: 47 mL/min — AB (ref >60)
GFR, EST NON AFRICAN AMERICAN: 41 mL/min — AB (ref >60)
Glucose, Bld: 99 mg/dL (ref 65–99)
POTASSIUM: 4.1 mmol/L (ref 3.5–5.1)
SODIUM: 140 mmol/L (ref 135–145)
Total Bilirubin: 0.9 mg/dL (ref 0.3–1.2)
Total Protein: 7.1 g/dL (ref 6.5–8.1)

## 2017-08-12 LAB — CBC
HCT: 40.8 % (ref 39.0–52.0)
HEMOGLOBIN: 13.9 g/dL (ref 13.0–17.0)
MCH: 33.7 pg (ref 26.0–34.0)
MCHC: 34.1 g/dL (ref 30.0–36.0)
MCV: 98.8 fL (ref 78.0–100.0)
PLATELETS: 171 10*3/uL (ref 150–400)
RBC: 4.13 MIL/uL — AB (ref 4.22–5.81)
RDW: 12.6 % (ref 11.5–15.5)
WBC: 5.5 10*3/uL (ref 4.0–10.5)

## 2017-08-12 LAB — LIPASE, BLOOD: LIPASE: 27 U/L (ref 11–51)

## 2017-08-12 NOTE — ED Provider Notes (Signed)
TIME SEEN: 4:13 AM  CHIEF COMPLAINT: Right back pain  HPI: Patient is a 57 year old male with history of HIV, kidney failure status post renal transplant in 2007, hepatitis C who presents to the emergency department with complaints of right back pain that started when he was moving furniture.  States he thinks he pulled a muscle but just wanted to come in and have his kidney checked out just in case.  No fevers, cough, vomiting, diarrhea, dysuria, hematuria, penile discharge, testicular pain or swelling.  Pain worse with movement.  No numbness or focal weakness.  ROS: See HPI Constitutional: no fever  Eyes: no drainage  ENT: no runny nose   Cardiovascular:  no chest pain  Resp: no SOB  GI: no vomiting GU: no dysuria Integumentary: no rash  Allergy: no hives  Musculoskeletal: no leg swelling  Neurological: no slurred speech ROS otherwise negative  PAST MEDICAL HISTORY/PAST SURGICAL HISTORY:  Past Medical History:  Diagnosis Date  . Hepatitis C   . HIV infection (HCC)   . Renal disorder renal failure    MEDICATIONS:  Prior to Admission medications   Medication Sig Start Date End Date Taking? Authorizing Provider  acetaminophen (TYLENOL) 325 MG tablet Take 1 tablet (325 mg total) by mouth every 6 (six) hours as needed for mild pain or moderate pain (pain). Patient not taking: Reported on 12/01/2016 04/12/13   PiepenbrinkVictorino Dike, PA-C  CELLCEPT 250 MG capsule Take 250 mg by mouth 2 (two) times daily. 01/29/14   [provider]  cetirizine (ZYRTEC ALLERGY) 10 MG tablet Take 1 tablet (10 mg total) by mouth daily. Patient not taking: Reported on 12/26/2016 09/09/14   Roxy Horseman, PA-C  DESCOVY 200-25 MG tablet TAKE 1 TABLET BY MOUTH DAILY. 06/26/17   Cliffton Asters, MD  erythromycin ophthalmic ointment Place a 1/2 inch ribbon of ointment into the lower eyelid every 8 hrs while awake for 5 days Patient not taking: Reported on 12/01/2016 09/26/15   Fayrene Helper, PA-C  latanoprost  (XALATAN) 0.005 % ophthalmic solution  06/03/14   [provider]  LORazepam (ATIVAN) 1 MG tablet Take 1 tablet (1 mg total) by mouth at bedtime. Patient not taking: Reported on 12/01/2016 07/18/14   Dohmeier, Porfirio Mylar, MD  methylPREDNISolone (MEDROL DOSEPAK) 4 MG TBPK tablet 6 day dose pack - take as directed 04/12/17   Felecia Shelling, DPM  predniSONE (DELTASONE) 5 MG tablet Take 5 mg by mouth daily. Take on Monday, Wednesday and Friday nights    [provider]  tacrolimus (PROGRAF) 1 MG capsule Take 1 mg by mouth 2 (two) times daily. Take with 5 mg tablet for a 6 mg dose    [provider]  tacrolimus (PROGRAF) 5 MG capsule Take 5 mg by mouth 2 (two) times daily. Take with 1 mg tablet for a 6 mg dose    [provider]  TIVICAY 50 MG tablet TAKE 1 TABLET BY MOUTH DAILY. 06/26/17   Cliffton Asters, MD    ALLERGIES:  Allergies  Allergen Reactions  . Hydrocodone Itching and Other (See Comments)    fidgety    SOCIAL HISTORY:  Social History   Tobacco Use  . Smoking status: Former Smoker    Packs/day: 0.30    Years: 20.00    Pack years: 6.00  . Smokeless tobacco: Never Used  Substance Use Topics  . Alcohol use: No    FAMILY HISTORY: Family History  Problem Relation Age of Onset  . Prostate cancer Father  EXAM: BP 131/82 (BP Location: Left Arm)   Pulse 64   Temp 97.7 F (36.5 C) (Oral)   Resp 16   Ht 5\' 8"  (1.727 m)   Wt 74.4 kg (164 lb)   SpO2 100%   BMI 24.94 kg/m  CONSTITUTIONAL: Alert and oriented and responds appropriately to questions. Well-appearing; well-nourished HEAD: Normocephalic EYES: Conjunctivae clear, pupils appear equal, EOMI ENT: normal nose; moist mucous membranes NECK: Supple, no meningismus, no nuchal rigidity, no LAD  CARD: RRR; S1 and S2 appreciated; no murmurs, no clicks, no rubs, no gallops RESP: Normal chest excursion without splinting or tachypnea; breath sounds clear and equal bilaterally; no wheezes, no  rhonchi, no rales, no hypoxia or respiratory distress, speaking full sentences ABD/GI: Normal bowel sounds; non-distended; soft, non-tender, no rebound, no guarding, no peritoneal signs, no hepatosplenomegaly BACK:  The back appears normal and is non-tender to palpation, there is no CVA tenderness, no midline spinal tenderness or step-off or deformity EXT: Normal ROM in all joints; non-tender to palpation; no edema; normal capillary refill; no cyanosis, no calf tenderness or swelling    SKIN: Normal color for age and race; warm; no rash NEURO: Moves all extremities equally, normal sensation diffusely, normal gait PSYCH: The patient's mood and manner are appropriate. Grooming and personal hygiene are appropriate.  MEDICAL DECISION MAKING: Patient here with what sounds like musculoskeletal strain.  He agrees but states that he wanted to have his kidney checked out just in case as he has had a history of a renal transplant in the right lower abdomen.  Abdominal exam is benign.  No fevers, vomiting or diarrhea.  No urinary symptoms.  We did check labs and urine in triage which show no significant abnormality.  He does have a creatinine of 1.7 but this appears improved from his baseline.  Urine shows no sign of infection or blood.  I feel he is safe for discharge home and take Tylenol as needed.  Discussed return precautions.  Neurologically intact.  No other sign of injury on exam.  I do not feel he needs emergent imaging.  He is comfortable with this plan.  At this time, I do not feel there is any life-threatening condition present. I have reviewed and discussed all results (EKG, imaging, lab, urine as appropriate) and exam findings with patient/family. I have reviewed nursing notes and appropriate previous records.  I feel the patient is safe to be discharged home without further emergent workup and can continue workup as an outpatient as needed. Discussed usual and customary return precautions.  Patient/family verbalize understanding and are comfortable with this plan.  Outpatient follow-up has been provided if needed. All questions have been answered.     Sartaj Hoskin, Layla MawKristen N, DO 08/12/17 [phone]

## 2017-08-12 NOTE — Discharge Instructions (Addendum)
Your kidney function was 1.73 today which is better than your most recent.  Your urine showed no sign of infection or blood.  Labs were otherwise normal.  You may take Tylenol 1000 mg every 6 hours as needed for pain.

## 2017-08-12 NOTE — ED Triage Notes (Signed)
Patient is complaining of right abdominal and flank pain. Patient states his right abdominal and right flank pain started tonight. Patient states he came because he has a hx of kidney transplant. Patient wants to make sure his right kidney is alright.

## 2017-08-12 NOTE — ED Notes (Signed)
Bed: WA13 Expected date:  Expected time:  Means of arrival:  Comments: 

## 2017-08-28 MED FILL — DESCOVY 200-25 MG TABS: 200-25 | 30 days supply | Qty: 30 | Fill #2

## 2017-08-28 MED FILL — TIVICAY 50 MG TABLET: 50 | 30 days supply | Qty: 30 | Fill #2

## 2017-09-18 DIAGNOSIS — Z940 Kidney transplant status: Secondary | ICD-10-CM | POA: Diagnosis not present

## 2017-09-18 DIAGNOSIS — D631 Anemia in chronic kidney disease: Secondary | ICD-10-CM | POA: Diagnosis not present

## 2017-09-21 DIAGNOSIS — N2581 Secondary hyperparathyroidism of renal origin: Secondary | ICD-10-CM | POA: Diagnosis not present

## 2017-09-21 DIAGNOSIS — Z940 Kidney transplant status: Secondary | ICD-10-CM | POA: Diagnosis not present

## 2017-09-21 DIAGNOSIS — Z9225 Personal history of immunosupression therapy: Secondary | ICD-10-CM | POA: Diagnosis not present

## 2017-09-21 DIAGNOSIS — T8611 Kidney transplant rejection: Secondary | ICD-10-CM | POA: Diagnosis not present

## 2017-10-03 MED FILL — DESCOVY 200-25 MG TABS: 200-25 | 30 days supply | Qty: 30 | Fill #3

## 2017-10-03 MED FILL — TIVICAY 50 MG TABLET: 50 | 30 days supply | Qty: 30 | Fill #3

## 2017-11-06 ENCOUNTER — Other Ambulatory Visit: Payer: Medicare Other

## 2017-11-06 DIAGNOSIS — B20 Human immunodeficiency virus [HIV] disease: Secondary | ICD-10-CM

## 2017-11-06 DIAGNOSIS — Z79899 Other long term (current) drug therapy: Secondary | ICD-10-CM | POA: Diagnosis not present

## 2017-11-07 LAB — CBC
HEMATOCRIT: 41.2 % (ref 38.5–50.0)
Hemoglobin: 14.1 g/dL (ref 13.2–17.1)
MCH: 32.9 pg (ref 27.0–33.0)
MCHC: 34.2 g/dL (ref 32.0–36.0)
MCV: 96 fL (ref 80.0–100.0)
MPV: 9.1 fL (ref 7.5–12.5)
Platelets: 192 10*3/uL (ref 140–400)
RBC: 4.29 10*6/uL (ref 4.20–5.80)
RDW: 12.6 % (ref 11.0–15.0)
WBC: 5.3 10*3/uL (ref 3.8–10.8)

## 2017-11-07 LAB — COMPLETE METABOLIC PANEL WITH GFR
AG Ratio: 1.4 (calc) (ref 1.0–2.5)
ALKALINE PHOSPHATASE (APISO): 58 U/L (ref 40–115)
ALT: 5 U/L — ABNORMAL LOW (ref 9–46)
AST: 12 U/L (ref 10–35)
Albumin: 4.1 g/dL (ref 3.6–5.1)
BILIRUBIN TOTAL: 0.8 mg/dL (ref 0.2–1.2)
BUN/Creatinine Ratio: 9 (calc) (ref 6–22)
BUN: 16 mg/dL (ref 7–25)
CO2: 26 mmol/L (ref 20–32)
CREATININE: 1.85 mg/dL — AB (ref 0.70–1.33)
Calcium: 8.6 mg/dL (ref 8.6–10.3)
Chloride: 105 mmol/L (ref 98–110)
GFR, Est African American: 46 mL/min/{1.73_m2} — ABNORMAL LOW (ref >=60)
GFR, Est Non African American: 39 mL/min/{1.73_m2} — ABNORMAL LOW (ref >=60)
GLOBULIN: 2.9 g/dL (ref 1.9–3.7)
GLUCOSE: 88 mg/dL (ref 65–99)
Potassium: 4.4 mmol/L (ref 3.5–5.3)
SODIUM: 139 mmol/L (ref 135–146)
Total Protein: 7 g/dL (ref 6.1–8.1)

## 2017-11-07 LAB — T-HELPER CELL (CD4) - (RCID CLINIC ONLY)
CD4 % Helper T Cell: 28 % — ABNORMAL LOW (ref 33–55)
CD4 T CELL ABS: 710 /uL (ref 400–2700)

## 2017-11-07 LAB — LIPID PANEL
Cholesterol: 125 mg/dL (ref <200)
HDL: 62 mg/dL (ref >40)
LDL CHOLESTEROL (CALC): 49 mg/dL
NON-HDL CHOLESTEROL (CALC): 63 mg/dL (ref <130)
TRIGLYCERIDES: 63 mg/dL (ref <150)
Total CHOL/HDL Ratio: 2 (calc) (ref <5.0)

## 2017-11-07 LAB — RPR: RPR: NONREACTIVE

## 2017-11-08 LAB — HIV-1 RNA QUANT-NO REFLEX-BLD
HIV 1 RNA Quant: <20 copies/mL
HIV-1 RNA Quant, Log: <1.3 Log copies/mL

## 2017-11-10 MED FILL — DESCOVY 200-25 MG TABS: 200-25 | 30 days supply | Qty: 30 | Fill #4

## 2017-11-10 MED FILL — TIVICAY 50 MG TABLET: 50 | 30 days supply | Qty: 30 | Fill #4

## 2017-11-21 ENCOUNTER — Encounter: Payer: Self-pay | Admitting: Internal Medicine

## 2017-11-21 ENCOUNTER — Ambulatory Visit (INDEPENDENT_AMBULATORY_CARE_PROVIDER_SITE_OTHER): Payer: Medicare Other | Admitting: Internal Medicine

## 2017-11-21 DIAGNOSIS — B20 Human immunodeficiency virus [HIV] disease: Secondary | ICD-10-CM

## 2017-11-21 NOTE — Assessment & Plan Note (Signed)
His infection is under excellent, long-term control.  He will continue his current regimen and follow-up after lab work in 12 months.

## 2017-11-21 NOTE — Progress Notes (Signed)
Patient Active Problem List   Diagnosis Date Noted  . Human immunodeficiency virus (HIV) disease (HCC) 05/10/1999    Priority: High  . Hepatitis C virus infection without hepatic coma 03/15/2006    Priority: Medium  . Medicare annual wellness visit, subsequent 03/23/2015  . Other specified anxiety disorders 07/18/2014  . Chronic insomnia 07/18/2014  . RA (retrograde amnesia) 07/18/2014  . TBI (traumatic brain injury) (HCC) 07/18/2014  . Post concussive syndrome 05/13/2014  . 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 Essential (primary) hypertension 06/06/2011  . KIDNEY TRANSPLANTATION 07/11/2007  . SYPHILIS 03/15/2006  . HYDROCELE NOS 03/15/2006  . SEIZURE DISORDER 03/15/2006  . ALCOHOL ABUSE, HX OF 03/15/2006  . HEPATITIS B, HX OF 03/15/2006  . TOBACCO USE, QUIT 03/15/2006  . PARATHYROIDECTOMY 08/08/2002  . CEREBROVASCULAR ACCIDENT, ACUTE 10/08/1999    Patient's Medications  New Prescriptions   No medications on file  Previous Medications   ACETAMINOPHEN (TYLENOL) 325 MG TABLET    Take 1 tablet (325 mg total) by mouth every 6 (six) hours as needed for mild pain or moderate pain (pain).   CELLCEPT 250 MG CAPSULE    Take 250 mg by mouth 2 (two) times daily.   CETIRIZINE (ZYRTEC ALLERGY) 10 MG TABLET    Take 1 tablet (10 mg total) by mouth daily.   DESCOVY 200-25 MG TABLET    TAKE 1 TABLET BY MOUTH DAILY.   ERYTHROMYCIN OPHTHALMIC OINTMENT    Place a 1/2 inch ribbon of ointment into the lower eyelid every 8 hrs while awake for 5 days   LATANOPROST (XALATAN) 0.005 % OPHTHALMIC SOLUTION       LORAZEPAM (ATIVAN) 1 MG TABLET    Take 1 tablet (1 mg total) by mouth at bedtime.   METHYLPREDNISOLONE (MEDROL DOSEPAK) 4 MG TBPK TABLET    6 day dose pack - take as directed   PREDNISONE (DELTASONE) 5 MG TABLET    Take 5 mg by mouth daily. Take on Monday, Wednesday and Friday nights   TACROLIMUS (PROGRAF) 1 MG CAPSULE    Take 1 mg by mouth 2 (two) times daily. Take with 5 mg  tablet for a 6 mg dose   TACROLIMUS (PROGRAF) 5 MG CAPSULE    Take 5 mg by mouth 2 (two) times daily. Take with 1 mg tablet for a 6 mg dose   TIVICAY 50 MG TABLET    TAKE 1 TABLET BY MOUTH DAILY.   ZOLPIDEM (AMBIEN) 10 MG TABLET    Take 10 mg by mouth at bedtime.  Modified Medications   No medications on file  Discontinued Medications   No medications on file    Subjective: Luis Reed is in for his routine HIV follow-up visit.  He has had no problems obtaining, taking or tolerating his Descovy and Tivicay and never misses any doses.  He is feeling well.  He stays busy with work and his 5 grandchildren who live locally.  Review of Systems: Review of Systems  Constitutional: Negative for chills, diaphoresis, fever, malaise/fatigue and weight loss.  HENT: Negative for sore throat.   Respiratory: Negative for cough, sputum production and shortness of breath.   Cardiovascular: Negative for chest pain.  Gastrointestinal: Negative for abdominal pain, diarrhea, heartburn, nausea and vomiting.  Genitourinary: Negative for dysuria and frequency.  Musculoskeletal: Negative for joint pain and myalgias.  Skin: Negative for rash.  Neurological: Negative for dizziness and headaches.  Psychiatric/Behavioral: Negative for depression and substance abuse.  The patient is not nervous/anxious.     Past Medical History:  Diagnosis Date  . Hepatitis C   . HIV infection (HCC)   . Renal disorder renal failure    Social History   Tobacco Use  . Smoking status: Former Smoker    Packs/day: 0.30    Years: 20.00    Pack years: 6.00  . Smokeless tobacco: Never Used  Substance Use Topics  . Alcohol use: No  . Drug use: No    Family History  Problem Relation Age of Onset  . Prostate cancer Father     Allergies  Allergen Reactions  . Hydrocodone Itching and Other (See Comments)    fidgety    Health Maintenance  Topic Date Due  . INFLUENZA VACCINE  12/07/2017  . COLONOSCOPY  09/08/2019  .  TETANUS/TDAP  11/07/2022  . Hepatitis C Screening  Completed  . HIV Screening  Completed    Objective:  Vitals:   11/21/17 0926  BP: (!) 145/78  Pulse: 60  Temp: 97.7 F (36.5 C)  TempSrc: Oral  Weight: 157 lb (71.2 kg)  Height: 5\' 8"  (1.727 m)   Body mass index is 23.87 kg/m.  Physical Exam  Constitutional: He is oriented to person, place, and time.  HENT:  Mouth/Throat: No oropharyngeal exudate.  Eyes: Conjunctivae are normal.  Cardiovascular: Normal rate and regular rhythm.  No murmur heard. Pulmonary/Chest: Effort normal and breath sounds normal.  Abdominal: Soft. He exhibits no mass. There is no tenderness.  Musculoskeletal: Normal range of motion.  Neurological: He is alert and oriented to person, place, and time.  Skin: No rash noted.  Psychiatric: He has a normal mood and affect.    Lab Results Lab Results  Component Value Date   WBC 5.3 11/06/2017   HGB 14.1 11/06/2017   HCT 41.2 11/06/2017   MCV 96.0 11/06/2017   PLT 192 11/06/2017    Lab Results  Component Value Date   CREATININE 1.85 (H) 11/06/2017   BUN 16 11/06/2017   NA 139 11/06/2017   K 4.4 11/06/2017   CL 105 11/06/2017   CO2 26 11/06/2017    Lab Results  Component Value Date   ALT 5 (L) 11/06/2017   AST 12 11/06/2017   ALKPHOS 55 08/12/2017   BILITOT 0.8 11/06/2017    Lab Results  Component Value Date   CHOL 125 11/06/2017   HDL 62 11/06/2017   LDLCALC 49 11/06/2017   TRIG 63 11/06/2017   CHOLHDL 2.0 11/06/2017   Lab Results  Component Value Date   LABRPR NON-REACTIVE 11/06/2017   HIV 1 RNA Quant (copies/mL)  Date Value  11/06/2017 <20 NOT DETECTED  12/01/2016 <20 NOT DETECTED  09/16/2015 <20   CD4 T Cell Abs (/uL)  Date Value  11/06/2017 710  12/01/2016 810  09/16/2015 730     Problem List Items Addressed This Visit      High   Human immunodeficiency virus (HIV) disease (HCC)   Relevant Orders   CBC   T-helper cell (CD4)- ([facility] clinic only)   Comprehensive  metabolic panel   Lipid panel   RPR   HIV 1 RNA quant-no reflex-bld        Luis AstersJohn Cherise Fedder, MD [facility] [phone] pager   [phone] cell 11/21/2017, 9:41 AM

## 2017-12-11 MED FILL — TIVICAY 50 MG TABLET: 50 | 30 days supply | Qty: 30 | Fill #5

## 2017-12-11 MED FILL — DESCOVY 200-25 MG TABS: 200-25 | 30 days supply | Qty: 30 | Fill #5

## 2017-12-19 DIAGNOSIS — D631 Anemia in chronic kidney disease: Secondary | ICD-10-CM | POA: Diagnosis not present

## 2017-12-19 DIAGNOSIS — Z940 Kidney transplant status: Secondary | ICD-10-CM | POA: Diagnosis not present

## 2017-12-19 DIAGNOSIS — N2581 Secondary hyperparathyroidism of renal origin: Secondary | ICD-10-CM | POA: Diagnosis not present

## 2018-01-03 ENCOUNTER — Other Ambulatory Visit: Payer: Self-pay | Admitting: Internal Medicine

## 2018-01-03 DIAGNOSIS — B20 Human immunodeficiency virus [HIV] disease: Secondary | ICD-10-CM

## 2018-01-04 MED FILL — TIVICAY 50 MG TABLET: 50 | 30 days supply | Qty: 30 | Fill #0

## 2018-01-04 MED FILL — DESCOVY 200-25 MG TABS: 200-25 | 30 days supply | Qty: 30 | Fill #0

## 2018-01-19 DIAGNOSIS — Z23 Encounter for immunization: Secondary | ICD-10-CM | POA: Diagnosis not present

## 2018-01-19 DIAGNOSIS — T8611 Kidney transplant rejection: Secondary | ICD-10-CM | POA: Diagnosis not present

## 2018-01-19 DIAGNOSIS — Z940 Kidney transplant status: Secondary | ICD-10-CM | POA: Diagnosis not present

## 2018-01-19 DIAGNOSIS — N2581 Secondary hyperparathyroidism of renal origin: Secondary | ICD-10-CM | POA: Diagnosis not present

## 2018-01-26 DIAGNOSIS — Z940 Kidney transplant status: Secondary | ICD-10-CM | POA: Diagnosis not present

## 2018-02-01 MED FILL — DESCOVY 200-25 MG TABS: 200-25 | 30 days supply | Qty: 30 | Fill #1

## 2018-02-01 MED FILL — TIVICAY 50 MG TABLET: 50 | 30 days supply | Qty: 30 | Fill #1

## 2018-02-27 ENCOUNTER — Encounter: Payer: Self-pay | Admitting: Family

## 2018-03-01 MED FILL — TIVICAY 50 MG TABLET: 50 | 30 days supply | Qty: 30 | Fill #2

## 2018-03-01 MED FILL — DESCOVY 200-25 MG TABS: 200-25 | 30 days supply | Qty: 30 | Fill #2

## 2018-03-06 ENCOUNTER — Encounter: Payer: Self-pay | Admitting: Vascular Surgery

## 2018-03-06 ENCOUNTER — Encounter: Payer: Self-pay | Admitting: *Deleted

## 2018-03-06 ENCOUNTER — Other Ambulatory Visit: Payer: Self-pay | Admitting: *Deleted

## 2018-03-06 ENCOUNTER — Ambulatory Visit: Payer: Medicare Other | Admitting: Vascular Surgery

## 2018-03-06 ENCOUNTER — Other Ambulatory Visit: Payer: Self-pay

## 2018-03-06 VITALS — BP 128/68 | HR 77 | Resp 18 | Ht 68.0 in | Wt 156.5 lb

## 2018-03-06 DIAGNOSIS — T82898A Other specified complication of vascular prosthetic devices, implants and grafts, initial encounter: Secondary | ICD-10-CM

## 2018-03-06 NOTE — Progress Notes (Signed)
[facility] [place]  Patient name: Luis Reed MRN: [id number] DOB: 1960/05/16 Sex: male  REASON FOR CONSULT: Evaluation of aneurysm left arm AV fistula  HPI: Luis Reed is a 57 y.o. male, who is here today for evaluation.  He is known to me from a prior fistula creation over 10 years ago.  He did initially have a left forearm fistula and this eventually failed.  He had subsequent placement of a brachiocephalic fistula.  He underwent renal transplant soon thereafter in 2009 and has had no use of this fistula.  Over the ensuing 10 years he has had continued marked aneurysmal change and is here today for discussion of ligation.  He denies any total arm swelling which would suggest central occlusive disease.  Does have marked collaterals off the fistula as well.  Past Medical History:  Diagnosis Date  . Hepatitis C   . HIV infection (HCC)   . Renal disorder renal failure    Family History  Problem Relation Age of Onset  . Prostate cancer Father     SOCIAL HISTORY: Social History   Socioeconomic History  . Marital status: Married    Spouse name: Steward Drone  . Number of children: 2  . Years of education: 58  . Highest education level: Not on file  Occupational History  . Occupation: Disabled    Comment: Kidney disease   Social Needs  . Financial resource strain: Not on file  . Food insecurity:    Worry: Not on file    Inability: Not on file  . Transportation needs:    Medical: Not on file    Non-medical: Not on file  Tobacco Use  . Smoking status: Former Smoker    Packs/day: 0.30    Years: 20.00    Pack years: 6.00  . Smokeless tobacco: Never Used  Substance and Sexual Activity  . Alcohol use: No  . Drug use: No  . Sexual activity: Not Currently    Partners: Female    Comment: declined condoms  Lifestyle  . Physical activity:    Days per week: Not on file    Minutes per session: Not on file  .  Stress: Not on file  Relationships  . Social connections:    Talks on phone: Not on file    Gets together: Not on file    Attends religious service: Not on file    Active member of club or organization: Not on file    Attends meetings of clubs or organizations: Not on file    Relationship status: Not on file  . Intimate partner violence:    Fear of current or ex partner: Not on file    Emotionally abused: Not on file    Physically abused: Not on file    Forced sexual activity: Not on file  Other Topics Concern  . Not on file  Social History Narrative   Born and raised in [place]. Currently resides in an house with his wife. No pets. Fun: fishing.    Denies religious beliefs that would effect health care.    Caffeine none.    Allergies  Allergen Reactions  . Hydrocodone Itching and Other (See Comments)    fidgety    Current Outpatient Medications  Medication Sig Dispense Refill  . acetaminophen (TYLENOL) 325 MG tablet Take 1 tablet (325 mg total) by mouth every 6 (six) hours as needed for mild pain or moderate pain (pain). 30 tablet 0  .  CELLCEPT 250 MG capsule Take 250 mg by mouth 2 (two) times daily.  3  . cetirizine (ZYRTEC ALLERGY) 10 MG tablet Take 1 tablet (10 mg total) by mouth daily. 30 tablet 1  . DESCOVY 200-25 MG tablet TAKE 1 TABLET BY MOUTH DAILY. 30 tablet 5  . erythromycin ophthalmic ointment Place a 1/2 inch ribbon of ointment into the lower eyelid every 8 hrs while awake for 5 days 1 g 0  . latanoprost (XALATAN) 0.005 % ophthalmic solution   2  . LORazepam (ATIVAN) 1 MG tablet Take 1 tablet (1 mg total) by mouth at bedtime. 30 tablet 0  . methylPREDNISolone (MEDROL DOSEPAK) 4 MG TBPK tablet 6 day dose pack - take as directed 21 tablet 0  . predniSONE (DELTASONE) 5 MG tablet Take 5 mg by mouth daily. Take on Monday, Wednesday and Friday nights    . tacrolimus (PROGRAF) 1 MG capsule Take 1 mg by mouth 2 (two) times daily. Take with 5 mg tablet for a 6 mg  dose    . tacrolimus (PROGRAF) 5 MG capsule Take 5 mg by mouth 2 (two) times daily. Take with 1 mg tablet for a 6 mg dose    . TIVICAY 50 MG tablet TAKE 1 TABLET BY MOUTH DAILY. 30 tablet 5  . zolpidem (AMBIEN) 10 MG tablet Take 10 mg by mouth at bedtime.  1   Current Facility-Administered Medications  Medication Dose Route Frequency Provider Last Rate Last Dose  . betamethasone acetate-betamethasone sodium phosphate (CELESTONE) injection 3 mg  3 mg Intramuscular Once Felecia Shelling, DPM        REVIEW OF SYSTEMS:  [X]  denotes positive finding, [ ]  denotes negative finding Cardiac  Comments:  Chest pain or chest pressure:    Shortness of breath upon exertion:    Short of breath when lying flat:    Irregular heart rhythm:        Vascular    Pain in calf, thigh, or hip brought on by ambulation:    Pain in feet at night that wakes you up from your sleep:     Blood clot in your veins:    Leg swelling:         Pulmonary    Oxygen at home:    Productive cough:     Wheezing:         Neurologic    Sudden weakness in arms or legs:     Sudden numbness in arms or legs:     Sudden onset of difficulty speaking or slurred speech:    Temporary loss of vision in one eye:     Problems with dizziness:         Gastrointestinal    Blood in stool:     Vomited blood:         Genitourinary    Burning when urinating:     Blood in urine:        Psychiatric    Major depression:         Hematologic    Bleeding problems:    Problems with blood clotting too easily:        Skin    Rashes or ulcers:        Constitutional    Fever or chills:      PHYSICAL EXAM: Vitals:   03/06/18 1023  BP: 128/68  Pulse: 77  Resp: 18  SpO2: 99%  Weight: 156 lb 8 oz (71 kg)  Height: 5\' 8"  (1.727  m)    GENERAL: The patient is a well-nourished male, in no acute distress. The vital signs are documented above. CARDIOVASCULAR: 2+ radial pulses bilaterally.  Very large tortuous cephalic vein fistula  from the antecubital space proximally.  Large collaterals coming off this extending down into the forearm. PULMONARY: There is good air exchange  ABDOMEN: Soft and non-tender  MUSCULOSKELETAL: There are no major deformities or cyanosis. NEUROLOGIC: No focal weakness or paresthesias are detected. SKIN: There are no ulcers or rashes noted. PSYCHIATRIC: The patient has a normal affect.  DATA:  None  MEDICAL ISSUES: Had long discussion with the patient and his wife present.  I did explain that if he had failure of his transplant that he could use his left upper arm fistula.  I did explain that ligation of this fistula would "burn the bridge" for she will access in the left arm.  Explained that he could have prosthetic graft in the left arm and potentially fistula creation in the right arm if he needed hemodialysis in the future.  He understands this and wishes to have ligation of the fistula.  I explained that we would resect the vein at the antecubital space since this can be rather bulky.  Explained that the proximal veins will decompress but occasionally will thrombosed first and have some fullness until this resolves.  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

## 2018-03-06 NOTE — H&P (View-Only) (Signed)
  [facility] [place]  Patient name: Luis Reed MRN: [id number] DOB: 02/08/1961 Sex: male  REASON FOR CONSULT: Evaluation of aneurysm left arm AV fistula  HPI: Luis Reed is a 57 y.o. male, who is here today for evaluation.  He is known to me from a prior fistula creation over 10 years ago.  He did initially have a left forearm fistula and this eventually failed.  He had subsequent placement of a brachiocephalic fistula.  He underwent renal transplant soon thereafter in 2009 and has had no use of this fistula.  Over the ensuing 10 years he has had continued marked aneurysmal change and is here today for discussion of ligation.  He denies any total arm swelling which would suggest central occlusive disease.  Does have marked collaterals off the fistula as well.  Past Medical History:  Diagnosis Date  . Hepatitis C   . HIV infection (HCC)   . Renal disorder renal failure    Family History  Problem Relation Age of Onset  . Prostate cancer Father     SOCIAL HISTORY: Social History   Socioeconomic History  . Marital status: Married    Spouse name: Brenda  . Number of children: 2  . Years of education: 12  . Highest education level: Not on file  Occupational History  . Occupation: Disabled    Comment: Kidney disease   Social Needs  . Financial resource strain: Not on file  . Food insecurity:    Worry: Not on file    Inability: Not on file  . Transportation needs:    Medical: Not on file    Non-medical: Not on file  Tobacco Use  . Smoking status: Former Smoker    Packs/day: 0.30    Years: 20.00    Pack years: 6.00  . Smokeless tobacco: Never Used  Substance and Sexual Activity  . Alcohol use: No  . Drug use: No  . Sexual activity: Not Currently    Partners: Female    Comment: declined condoms  Lifestyle  . Physical activity:    Days per week: Not on file    Minutes per session: Not on file  .  Stress: Not on file  Relationships  . Social connections:    Talks on phone: Not on file    Gets together: Not on file    Attends religious service: Not on file    Active member of club or organization: Not on file    Attends meetings of clubs or organizations: Not on file    Relationship status: Not on file  . Intimate partner violence:    Fear of current or ex partner: Not on file    Emotionally abused: Not on file    Physically abused: Not on file    Forced sexual activity: Not on file  Other Topics Concern  . Not on file  Social History Narrative   Born and raised in [place]. Currently resides in an house with his wife. No pets. Fun: fishing.    Denies religious beliefs that would effect health care.    Caffeine none.    Allergies  Allergen Reactions  . Hydrocodone Itching and Other (See Comments)    fidgety    Current Outpatient Medications  Medication Sig Dispense Refill  . acetaminophen (TYLENOL) 325 MG tablet Take 1 tablet (325 mg total) by mouth every 6 (six) hours as needed for mild pain or moderate pain (pain). 30 tablet 0  .   CELLCEPT 250 MG capsule Take 250 mg by mouth 2 (two) times daily.  3  . cetirizine (ZYRTEC ALLERGY) 10 MG tablet Take 1 tablet (10 mg total) by mouth daily. 30 tablet 1  . DESCOVY 200-25 MG tablet TAKE 1 TABLET BY MOUTH DAILY. 30 tablet 5  . erythromycin ophthalmic ointment Place a 1/2 inch ribbon of ointment into the lower eyelid every 8 hrs while awake for 5 days 1 g 0  . latanoprost (XALATAN) 0.005 % ophthalmic solution   2  . LORazepam (ATIVAN) 1 MG tablet Take 1 tablet (1 mg total) by mouth at bedtime. 30 tablet 0  . methylPREDNISolone (MEDROL DOSEPAK) 4 MG TBPK tablet 6 day dose pack - take as directed 21 tablet 0  . predniSONE (DELTASONE) 5 MG tablet Take 5 mg by mouth daily. Take on Monday, Wednesday and Friday nights    . tacrolimus (PROGRAF) 1 MG capsule Take 1 mg by mouth 2 (two) times daily. Take with 5 mg tablet for a 6 mg  dose    . tacrolimus (PROGRAF) 5 MG capsule Take 5 mg by mouth 2 (two) times daily. Take with 1 mg tablet for a 6 mg dose    . TIVICAY 50 MG tablet TAKE 1 TABLET BY MOUTH DAILY. 30 tablet 5  . zolpidem (AMBIEN) 10 MG tablet Take 10 mg by mouth at bedtime.  1   Current Facility-Administered Medications  Medication Dose Route Frequency Provider Last Rate Last Dose  . betamethasone acetate-betamethasone sodium phosphate (CELESTONE) injection 3 mg  3 mg Intramuscular Once Evans, Brent M, DPM        REVIEW OF SYSTEMS:  [X] denotes positive finding, [ ] denotes negative finding Cardiac  Comments:  Chest pain or chest pressure:    Shortness of breath upon exertion:    Short of breath when lying flat:    Irregular heart rhythm:        Vascular    Pain in calf, thigh, or hip brought on by ambulation:    Pain in feet at night that wakes you up from your sleep:     Blood clot in your veins:    Leg swelling:         Pulmonary    Oxygen at home:    Productive cough:     Wheezing:         Neurologic    Sudden weakness in arms or legs:     Sudden numbness in arms or legs:     Sudden onset of difficulty speaking or slurred speech:    Temporary loss of vision in one eye:     Problems with dizziness:         Gastrointestinal    Blood in stool:     Vomited blood:         Genitourinary    Burning when urinating:     Blood in urine:        Psychiatric    Major depression:         Hematologic    Bleeding problems:    Problems with blood clotting too easily:        Skin    Rashes or ulcers:        Constitutional    Fever or chills:      PHYSICAL EXAM: Vitals:   03/06/18 1023  BP: 128/68  Pulse: 77  Resp: 18  SpO2: 99%  Weight: 156 lb 8 oz (71 kg)  Height: 5' 8" (1.727   m)    GENERAL: The patient is a well-nourished male, in no acute distress. The vital signs are documented above. CARDIOVASCULAR: 2+ radial pulses bilaterally.  Very large tortuous cephalic vein fistula  from the antecubital space proximally.  Large collaterals coming off this extending down into the forearm. PULMONARY: There is good air exchange  ABDOMEN: Soft and non-tender  MUSCULOSKELETAL: There are no major deformities or cyanosis. NEUROLOGIC: No focal weakness or paresthesias are detected. SKIN: There are no ulcers or rashes noted. PSYCHIATRIC: The patient has a normal affect.  DATA:  None  MEDICAL ISSUES: Had long discussion with the patient and his wife present.  I did explain that if he had failure of his transplant that he could use his left upper arm fistula.  I did explain that ligation of this fistula would "burn the bridge" for she will access in the left arm.  Explained that he could have prosthetic graft in the left arm and potentially fistula creation in the right arm if he needed hemodialysis in the future.  He understands this and wishes to have ligation of the fistula.  I explained that we would resect the vein at the antecubital space since this can be rather bulky.  Explained that the proximal veins will decompress but occasionally will thrombosed first and have some fullness until this resolves.  We will coordinate this as an outpatient  He does have a history of malignant hyperthermia and we will notify anesthesia of this as well   Imogean Ciampa F. Tynell Winchell, MD FACS [facility] [place] Office Tel [phone] Pager [phone]    

## 2018-03-14 ENCOUNTER — Encounter (HOSPITAL_COMMUNITY): Payer: Self-pay | Admitting: *Deleted

## 2018-03-14 ENCOUNTER — Other Ambulatory Visit: Payer: Self-pay

## 2018-03-14 NOTE — Progress Notes (Signed)
Pt denies SOB, chest pain, and being under the care of a cardiologist. Pt denies having a stress test, echo and cardiac cath. Pt denies having an EKG and chest x ary within the last year. Pt made aware to stop taking vitamins, fish oil and herbal medications. Do not take any NSAIDs ie: Ibuprofen, Advil, Naproxen (Aleve), Motrin, BC and Goody Powder. Pt verbalized understandding of all pre-op instructions. Anesthesia made aware of pt history of Malignant Hyperthermia.

## 2018-03-15 NOTE — Anesthesia Preprocedure Evaluation (Addendum)
Anesthesia Evaluation  Patient identified by MRN, date of birth, ID band Patient awake    Reviewed: Allergy & Precautions, NPO status , Patient's Chart, lab work & pertinent test results  History of Anesthesia Complications (+) MALIGNANT HYPERTHERMIA and history of anesthetic complications (pt has had fever >103 s/p multiple anesthetics, yet 2 of these were MAC and non-triggeriing)  Airway Mallampati: II  TM Distance: >3 FB Neck ROM: Full    Dental  (+) Poor Dentition, Chipped, Dental Advisory Given, Missing   Pulmonary former smoker,    breath sounds clear to auscultation       Cardiovascular hypertension (no meds now that has functioning transplant), (-) angina Rhythm:Regular Rate:Normal     Neuro/Psych    GI/Hepatic negative GI ROS, (+)     substance abuse  alcohol use, Hepatitis -, C  Endo/Other  negative endocrine ROS  Renal/GU Renal disease (no longer requires dialysis)No s/p renal transplant     Musculoskeletal   Abdominal   Peds  Hematology   Anesthesia Other Findings   Reproductive/Obstetrics                           Anesthesia Physical Anesthesia Plan  ASA: III  Anesthesia Plan: MAC   Post-op Pain Management:    Induction:   PONV Risk Score and Plan: 1 and Ondansetron  Airway Management Planned: Nasal Cannula and Natural Airway  Additional Equipment:   Intra-op Plan:   Post-operative Plan:   Informed Consent: I have reviewed the patients History and Physical, chart, labs and discussed the procedure including the risks, benefits and alternatives for the proposed anesthesia with the patient or authorized representative who has indicated his/her understanding and acceptance.   Dental advisory given  Plan Discussed with: CRNA and Surgeon  Anesthesia Plan Comments: (PAT note written 03/15/2018 by Myra Gianotti, PA-C. See highlighted paragraph concerning anesthesia  history. Plan routine monitors, MAC, machine flushed, MH filter placed  )      Anesthesia Quick Evaluation

## 2018-03-15 NOTE — Progress Notes (Addendum)
Anesthesia Chart Review: SAME DAY WORK-UP   Case:  [id number] Date/Time:  03/16/18 1430   Procedure:  LIGATION OF ARTERIOVENOUS  FISTULA LEFT ARM (Left )   Anesthesia type:  Monitor Anesthesia Care   Pre-op diagnosis:  COMPLETION OF HEMODIALYSIS  THERAPY/ HISTORY OF MALIGNANT HYPOTHERMIA   Location:  [facility] ROOM 16 / [facility] OR   Surgeon:  Larina Earthly, MD      DISCUSSION: Patient is a 57 year old male scheduled for the above procedure.  History includes former smoker, HIV (diagnosed 2001), hepatitis C (s/p treatment; HCV quantitative not detected 12/01/16; positive Hep B S Ab, negative Hep B S Ag 07/03/06), ESRD (started HD 2001, s/p left radiocephalic AVF 12/03/99, s/p ligation 02/12/07; s/p left brachiocephalic AVF 01/06/07; s/p deceased done kidney transplant 07/09/07), CVA (possibly cocaine induced 10/13/99), parathyroidectomy with autotransplantation to right forearm 06/14/02.  Patient reported history of MALIGNANT HYPERTHERMIA. I called him to get more details. He reported that after one of his AVF creations he had a temperature of 103 for several days and had to stay in the hospital longer than anticipated. He reported that the anesthesiologist spoke with him about potential concern for malignant hyperthermia. He has never been tested for MH, nor have his family members. I reviewed several of his records in [place] and [facility] Everywhere and did not see malignant hyperthermia listed in his history until his 03/06/18 office visit with Dr. Arbie Cookey. I will attempt to get his anesthesia records from his 07/09/07 renal transplant and his last two anesthesia records from [facility], but in review of his discharge summary following 2007/01/06 left brachiocephalic AVF creation, he remained hospitalized until 12/25/06 with fever (Tmax of 105 degree F) and hypotension secondary to right lower lobe pneumonia and also had thrombocytopenia thought due to sepsis. He did require an ICU stay. He required cooling blankets and ice packs. He  was treated with vancomycin and Elita Quick initially and Avelox added after consulting with ID. Blood cultures were negative. He was afebrile by HD#3. There is no mention of concern for malignant hyperthermia in the discharge summary, although certainly with a high fever post-operatively. (UPDATE 03/15/18 11:29 AM: 07/09/07 anesthesia records received. Per record, MH history noted and malignant hyperthermia precautions utilized to avoid triggering agents. TIVA based anesthetic, BIS used.) (UPDATE 03/16/18 2:15 PM: For 01-06-07 AVF creation: propofol, Versed, Sufenta, lidocaine, and heparin were used intraoperatively. For 02/14/07 AVF ligation: lidocaine, propofol, Versed, Fentanyl, Ketamine, Robinul were given.)  Anesthesiologist to evaluate on the day of surgery and can review previous anesthesia records if received.   PROVIDERS: Veryl Speak, FNP is PCP  Terrial Rhodes, MD is nephrologist. He had renal transplant follow-up with Bing Plume, MD last on 06/06/11 [facility] Everywhere). Cliffton Asters, MD is ID. Last visit 11/21/17.   LABS: For day of surgery. CBC on 11/06/17 was WNL. His recent renal trends: Lab Results  Component Value Date   CREATININE 1.85 (H) 11/06/2017   CREATININE 1.77 (H) 08/12/2017   CREATININE 1.93 (H) 12/01/2016    EKG: For day of surgery. Comparison EKG from [facility] on 07/06/05 showed SR, first degree AV block.   CV: Echo 12/19/06: SUMMARY - Overall left ventricular systolic function was vigorous. Left    ventricular ejection fraction was estimated , range being 70    % to 80 %. There was no diagnostic evidence of left    ventricular regional wall motion abnormalities. Left    ventricular wall thickness was mildly increased. Left  ventricular diastolic function parameters were normal. IMPRESSIONS - No likely, discrete, intracardiac source of emboli was apparent.    The study would have limited sensitivity for such a  source.   Past Medical History:  Diagnosis Date  . Hepatitis C   . HIV infection (HCC)   . Malignant hyperthermia   . Renal disorder renal failure    Past Surgical History:  Procedure Laterality Date  . AV FISTULA PLACEMENT    . NEPHRECTOMY TRANSPLANTED ORGAN      MEDICATIONS: . betamethasone acetate-betamethasone sodium phosphate (CELESTONE) injection 3 mg   . calcitRIOL (ROCALTROL) 0.5 MCG capsule  . CELLCEPT 250 MG capsule  . DESCOVY 200-25 MG tablet  . predniSONE (DELTASONE) 5 MG tablet  . tacrolimus (PROGRAF) 1 MG capsule  . tacrolimus (PROGRAF) 5 MG capsule  . TIVICAY 50 MG tablet  . zolpidem (AMBIEN) 10 MG tablet    Velna [facility] Short Stay Center/Anesthesiology Phone [phone] 03/15/2018 10:30 AM

## 2018-03-16 ENCOUNTER — Ambulatory Visit (HOSPITAL_COMMUNITY)
Admission: RE | Admit: 2018-03-16 | Discharge: 2018-03-16 | Disposition: A | Discharge status: Home / Self Care | Payer: Medicare HMO | Source: Ambulatory Visit | Attending: Vascular Surgery | Admitting: Vascular Surgery

## 2018-03-16 ENCOUNTER — Encounter (HOSPITAL_COMMUNITY): Payer: Self-pay

## 2018-03-16 ENCOUNTER — Other Ambulatory Visit: Payer: Self-pay

## 2018-03-16 ENCOUNTER — Ambulatory Visit (HOSPITAL_COMMUNITY): Payer: Medicare HMO | Admitting: Vascular Surgery

## 2018-03-16 ENCOUNTER — Encounter (HOSPITAL_COMMUNITY)
Admission: RE | Disposition: A | Discharge status: Home / Self Care | Payer: Self-pay | Source: Ambulatory Visit | Attending: Vascular Surgery

## 2018-03-16 DIAGNOSIS — N179 Acute kidney failure, unspecified: Secondary | ICD-10-CM | POA: Diagnosis not present

## 2018-03-16 DIAGNOSIS — N189 Chronic kidney disease, unspecified: Secondary | ICD-10-CM | POA: Insufficient documentation

## 2018-03-16 DIAGNOSIS — Z8619 Personal history of other infectious and parasitic diseases: Secondary | ICD-10-CM | POA: Diagnosis not present

## 2018-03-16 DIAGNOSIS — F1011 Alcohol abuse, in remission: Secondary | ICD-10-CM | POA: Insufficient documentation

## 2018-03-16 DIAGNOSIS — Z885 Allergy status to narcotic agent status: Secondary | ICD-10-CM | POA: Diagnosis not present

## 2018-03-16 DIAGNOSIS — Z79899 Other long term (current) drug therapy: Secondary | ICD-10-CM | POA: Insufficient documentation

## 2018-03-16 DIAGNOSIS — Z87891 Personal history of nicotine dependence: Secondary | ICD-10-CM | POA: Diagnosis not present

## 2018-03-16 DIAGNOSIS — F1911 Other psychoactive substance abuse, in remission: Secondary | ICD-10-CM | POA: Diagnosis not present

## 2018-03-16 DIAGNOSIS — I770 Arteriovenous fistula, acquired: Secondary | ICD-10-CM | POA: Insufficient documentation

## 2018-03-16 DIAGNOSIS — Z940 Kidney transplant status: Secondary | ICD-10-CM | POA: Diagnosis not present

## 2018-03-16 DIAGNOSIS — Z9889 Other specified postprocedural states: Secondary | ICD-10-CM | POA: Insufficient documentation

## 2018-03-16 DIAGNOSIS — Z87820 Personal history of traumatic brain injury: Secondary | ICD-10-CM | POA: Diagnosis not present

## 2018-03-16 DIAGNOSIS — G40909 Epilepsy, unspecified, not intractable, without status epilepticus: Secondary | ICD-10-CM | POA: Insufficient documentation

## 2018-03-16 DIAGNOSIS — B20 Human immunodeficiency virus [HIV] disease: Secondary | ICD-10-CM | POA: Diagnosis not present

## 2018-03-16 DIAGNOSIS — B1920 Unspecified viral hepatitis C without hepatic coma: Secondary | ICD-10-CM | POA: Diagnosis not present

## 2018-03-16 DIAGNOSIS — I129 Hypertensive chronic kidney disease with stage 1 through stage 4 chronic kidney disease, or unspecified chronic kidney disease: Secondary | ICD-10-CM | POA: Diagnosis not present

## 2018-03-16 DIAGNOSIS — T82898A Other specified complication of vascular prosthetic devices, implants and grafts, initial encounter: Secondary | ICD-10-CM | POA: Diagnosis not present

## 2018-03-16 DIAGNOSIS — F5104 Psychophysiologic insomnia: Secondary | ICD-10-CM | POA: Diagnosis not present

## 2018-03-16 HISTORY — PX: LIGATION OF ARTERIOVENOUS  FISTULA: SHX5948

## 2018-03-16 HISTORY — DX: Malignant hyperthermia due to anesthesia, initial encounter: T88.3XXA

## 2018-03-16 LAB — POCT I-STAT 4, (NA,K, GLUC, HGB,HCT)
Glucose, Bld: 95 mg/dL (ref 70–99)
HCT: 46 % (ref 39.0–52.0)
Hemoglobin: 15.6 g/dL (ref 13.0–17.0)
Potassium: 4.2 mmol/L (ref 3.5–5.1)
Sodium: 141 mmol/L (ref 135–145)

## 2018-03-16 SURGERY — LIGATION OF ARTERIOVENOUS  FISTULA
Anesthesia: Monitor Anesthesia Care | Site: Arm Lower | Laterality: Left

## 2018-03-16 MED ORDER — SODIUM CHLORIDE 0.9 % IV SOLN
INTRAVENOUS | Status: DC
  Administered 2018-03-16 (×2): via INTRAVENOUS

## 2018-03-16 MED ORDER — MEPERIDINE HCL 50 MG/ML IJ SOLN: 6.2500 mg | INTRAMUSCULAR | Status: DC | PRN

## 2018-03-16 MED ORDER — PROPOFOL 10 MG/ML IV BOLUS
INTRAVENOUS | Status: DC | PRN
  Administered 2018-03-16: 20 mg via INTRAVENOUS

## 2018-03-16 MED ORDER — FENTANYL CITRATE (PF) 250 MCG/5ML IJ SOLN
INTRAMUSCULAR | Status: AC
  Filled 2018-03-16: qty 5

## 2018-03-16 MED ORDER — CHLORHEXIDINE GLUCONATE 4 % EX LIQD: 60.0000 mL | Freq: Once | CUTANEOUS | Status: DC

## 2018-03-16 MED ORDER — LIDOCAINE-EPINEPHRINE 0.5 %-1:200000 IJ SOLN
INTRAMUSCULAR | Status: AC
  Filled 2018-03-16: qty 1

## 2018-03-16 MED ORDER — PROMETHAZINE HCL 25 MG/ML IJ SOLN: 6.2500 mg | INTRAMUSCULAR | Status: DC | PRN

## 2018-03-16 MED ORDER — 0.9 % SODIUM CHLORIDE (POUR BTL) OPTIME
TOPICAL | Status: DC | PRN
  Administered 2018-03-16: 1000 mL

## 2018-03-16 MED ORDER — LIDOCAINE 2% (20 MG/ML) 5 ML SYRINGE
INTRAMUSCULAR | Status: DC | PRN
  Administered 2018-03-16: 50 mg via INTRAVENOUS

## 2018-03-16 MED ORDER — LIDOCAINE 2% (20 MG/ML) 5 ML SYRINGE
INTRAMUSCULAR | Status: AC
  Filled 2018-03-16: qty 5

## 2018-03-16 MED ORDER — HYDROMORPHONE HCL 1 MG/ML IJ SOLN
0.2500 mg | INTRAMUSCULAR | Status: DC | PRN
  Administered 2018-03-16: 0.5 mg via INTRAVENOUS

## 2018-03-16 MED ORDER — CEFAZOLIN SODIUM-DEXTROSE 2-4 GM/100ML-% IV SOLN
2.0000 g | INTRAVENOUS | Status: AC
  Administered 2018-03-16: 2 g via INTRAVENOUS
  Filled 2018-03-16: qty 100

## 2018-03-16 MED ORDER — ONDANSETRON HCL 4 MG/2ML IJ SOLN
INTRAMUSCULAR | Status: DC | PRN
  Administered 2018-03-16: 4 mg via INTRAVENOUS

## 2018-03-16 MED ORDER — FENTANYL CITRATE (PF) 100 MCG/2ML IJ SOLN
INTRAMUSCULAR | Status: DC | PRN
  Administered 2018-03-16: 50 ug via INTRAVENOUS

## 2018-03-16 MED ORDER — HYDROMORPHONE HCL 1 MG/ML IJ SOLN
INTRAMUSCULAR | Status: AC
  Filled 2018-03-16: qty 1

## 2018-03-16 MED ORDER — LIDOCAINE-EPINEPHRINE 0.5 %-1:200000 IJ SOLN
INTRAMUSCULAR | Status: DC | PRN
  Administered 2018-03-16: 10 mL

## 2018-03-16 MED ORDER — PROPOFOL 1000 MG/100ML IV EMUL
INTRAVENOUS | Status: AC
  Filled 2018-03-16: qty 200

## 2018-03-16 MED ORDER — OXYCODONE-ACETAMINOPHEN 5-325 MG PO TABS: 1.0000 | ORAL_TABLET | Freq: Four times a day (QID) | ORAL | 0 refills | Status: DC | PRN

## 2018-03-16 MED ORDER — PROPOFOL 500 MG/50ML IV EMUL
INTRAVENOUS | Status: DC | PRN
  Administered 2018-03-16: 75 ug/kg/min via INTRAVENOUS

## 2018-03-16 MED ORDER — MIDAZOLAM HCL 2 MG/2ML IJ SOLN: 0.5000 mg | Freq: Once | INTRAMUSCULAR | Status: DC | PRN

## 2018-03-16 SURGICAL SUPPLY — 29 items
ADH SKN CLS APL DERMABOND .7 (GAUZE/BANDAGES/DRESSINGS) ×1
CANISTER SUCT 3000ML PPV (MISCELLANEOUS) ×2 IMPLANT
CLIP LIGATING EXTRA MED SLVR (CLIP) ×2 IMPLANT
CLIP LIGATING EXTRA SM BLUE (MISCELLANEOUS) ×2 IMPLANT
COVER WAND RF STERILE (DRAPES) ×2 IMPLANT
DECANTER SPIKE VIAL GLASS SM (MISCELLANEOUS) ×2 IMPLANT
DERMABOND ADVANCED (GAUZE/BANDAGES/DRESSINGS) ×1
DERMABOND ADVANCED .7 DNX12 (GAUZE/BANDAGES/DRESSINGS) ×1 IMPLANT
ELECT REM PT RETURN 9FT ADLT (ELECTROSURGICAL) ×2
ELECTRODE REM PT RTRN 9FT ADLT (ELECTROSURGICAL) ×1 IMPLANT
GLOVE SS BIOGEL STRL SZ 7.5 (GLOVE) ×1 IMPLANT
GLOVE SUPERSENSE BIOGEL SZ 7.5 (GLOVE) ×1
GOWN STRL REUS W/ TWL LRG LVL3 (GOWN DISPOSABLE) ×3 IMPLANT
GOWN STRL REUS W/TWL LRG LVL3 (GOWN DISPOSABLE) ×6
KIT BASIN OR (CUSTOM PROCEDURE TRAY) ×2 IMPLANT
KIT TURNOVER KIT B (KITS) ×2 IMPLANT
NS IRRIG 1000ML POUR BTL (IV SOLUTION) ×2 IMPLANT
PACK CV ACCESS (CUSTOM PROCEDURE TRAY) ×2 IMPLANT
PAD ARMBOARD 7.5X6 YLW CONV (MISCELLANEOUS) ×4 IMPLANT
SUT ETHILON 3 0 PS 1 (SUTURE) IMPLANT
SUT PROLENE 5 0 C 1 24 (SUTURE) ×1 IMPLANT
SUT PROLENE 5 0 C 1 36 (SUTURE) ×1 IMPLANT
SUT PROLENE 6 0 CC (SUTURE) IMPLANT
SUT SILK 0 TIES 10X30 (SUTURE) ×2 IMPLANT
SUT VIC AB 3-0 SH 27 (SUTURE) ×2
SUT VIC AB 3-0 SH 27X BRD (SUTURE) ×1 IMPLANT
TOWEL GREEN STERILE (TOWEL DISPOSABLE) ×2 IMPLANT
UNDERPAD 30X30 (UNDERPADS AND DIAPERS) ×2 IMPLANT
WATER STERILE IRR 1000ML POUR (IV SOLUTION) ×2 IMPLANT

## 2018-03-16 NOTE — Interval H&P Note (Signed)
History and Physical Interval Note:  03/16/2018 2:37 PM  Luis Reed  has presented today for surgery, with the diagnosis of COMPLETION OF HEMODIALYSIS  THERAPY/ HISTORY OF MALIGNANT HYPOTHERMIA  The various methods of treatment have been discussed with the patient and family. After consideration of risks, benefits and other options for treatment, the patient has consented to  Procedure(s): LIGATION OF ARTERIOVENOUS  FISTULA LEFT ARM (Left) as a surgical intervention .  The patient's history has been reviewed, patient examined, no change in status, stable for surgery.  I have reviewed the patient's chart and labs.  Questions were answered to the patient's satisfaction.     Waverly Ferrari

## 2018-03-16 NOTE — Transfer of Care (Signed)
Immediate Anesthesia Transfer of Care Note  Patient: IAAN OREGEL  Procedure(s) Performed: LIGATION OF ARTERIOVENOUS  FISTULA LEFT ARM (Left Arm Lower)  Patient Location: PACU  Anesthesia Type:MAC  Level of Consciousness: awake, alert  and oriented  Airway & Oxygen Therapy: Patient Spontanous Breathing  Post-op Assessment: Report given to RN and Post -op Vital signs reviewed and stable  Post vital signs: Reviewed and stable  Last Vitals:  Vitals Value Taken Time  BP 121/79 03/16/2018  4:20 PM  Temp 36.6 C 03/16/2018  4:20 PM  Pulse 60 03/16/2018  4:22 PM  Resp 17 03/16/2018  4:22 PM  SpO2 100 % 03/16/2018  4:22 PM  Vitals shown include unvalidated device data.  Last Pain:  Vitals:   03/16/18 1620  TempSrc:   PainSc: (P) 0-No pain         Complications: No apparent anesthesia complications

## 2018-03-16 NOTE — Op Note (Signed)
    OPERATIVE REPORT  DATE OF SURGERY: 03/16/2018  PATIENT: Luis Reed, 57 y.o. male MRN: [id number]  DOB: [birth year]  PRE-OPERATIVE DIAGNOSIS: Mega fistula left upper arm brachiocephalic fistula  POST-OPERATIVE DIAGNOSIS:  Same  PROCEDURE: Ligation of fistula and excision of fluid at the antecubital space  SURGEON:  Gretta Began, M.D.  PHYSICIAN ASSISTANT: Nurse  ANESTHESIA: Local with sedation  EBL: per anesthesia record  Total I/O In: 400 [I.V.:400] Out: 10 [Blood:10]  BLOOD ADMINISTERED: none  DRAINS: none  SPECIMEN: none  COUNTS CORRECT:  YES  PATIENT DISPOSITION:  PACU - hemodynamically stable  PROCEDURE DETAILS: The patient was taken to the operating room placed supine position where the area of the left arm prepped and draped you sterile fashion.  Patient is a renal transplant patient and is no longer using his fistula.  He has massive dilatation of this and wishes ligation.  Using local anesthesia incision made over the antecubital space and carried down to isolate the vein.  The large aneurysmal area at the antecubital space was mobilized proximally and distally.  The vein was isolated at the old brachial anastomosis and was occluded with a baby Gregory clamp.  The vein was mobilized further distally and was occluded distally as well.  The vein was transected out of the antecubital space.  The vein was oversewed in 2 layers with 5-0 Prolene suture near the brachial artery anastomosis.  It was also ligated at the distal area where it was excised.  Clamps were removed and wounds were irrigated.  The wound was irrigated and hemostasis electrocautery.  The wound was closed with 3-0 Vicryl in the subcutaneous and subcuticular tissue.  Sterile dressing was applied and the patient was transferred to the recovery room in stable condition   Larina Earthly, M.D., [facility] 03/16/2018 4:14 PM

## 2018-03-16 NOTE — Progress Notes (Signed)
    No heavy lifting for 1 week to allow incisional healing.     Mosetta Pigeon PA-C

## 2018-03-16 NOTE — Interval H&P Note (Signed)
History and Physical Interval Note:  03/16/2018 3:01 PM  Luis Reed  has presented today for surgery, with the diagnosis of COMPLETION OF HEMODIALYSIS  THERAPY/ HISTORY OF MALIGNANT HYPOTHERMIA  The various methods of treatment have been discussed with the patient and family. After consideration of risks, benefits and other options for treatment, the patient has consented to  Procedure(s): LIGATION OF ARTERIOVENOUS  FISTULA LEFT ARM (Left) as a surgical intervention .  The patient's history has been reviewed, patient examined, no change in status, stable for surgery.  I have reviewed the patient's chart and labs.  Questions were answered to the patient's satisfaction.     Gretta Began

## 2018-03-16 NOTE — Anesthesia Postprocedure Evaluation (Signed)
Anesthesia Post Note  Patient: Luis Reed  Procedure(s) Performed: LIGATION OF ARTERIOVENOUS  FISTULA LEFT ARM (Left Arm Lower)     Patient location during evaluation: PACU Anesthesia Type: MAC Level of consciousness: awake and alert, oriented and patient cooperative Pain management: pain level controlled Vital Signs Assessment: post-procedure vital signs reviewed and stable Respiratory status: spontaneous breathing, nonlabored ventilation and respiratory function stable Cardiovascular status: blood pressure returned to baseline and stable Postop Assessment: no apparent nausea or vomiting and adequate PO intake Anesthetic complications: no    Last Vitals:  Vitals:   03/16/18 1204 03/16/18 1620  BP: (!) 148/73 121/79  Pulse: (!) 56 (!) 49  Resp: 18 16  Temp: 36.5 C 36.6 C  SpO2: 100% 98%    Last Pain:  Vitals:   03/16/18 1633  TempSrc:   PainSc: 6                  Salbador Fiveash,E. Idelia Caudell

## 2018-03-17 ENCOUNTER — Encounter (HOSPITAL_COMMUNITY): Payer: Self-pay | Admitting: Vascular Surgery

## 2018-03-20 ENCOUNTER — Telehealth: Payer: Self-pay | Admitting: *Deleted

## 2018-03-20 NOTE — Telephone Encounter (Signed)
Patient called C/o swelling in arm. Denies any pain, redness or heat. Instructed to elevate extremity above level of heart and exercise hand . To call office back if no improvement or worsening condition.

## 2018-03-26 ENCOUNTER — Encounter: Payer: Self-pay | Admitting: Family

## 2018-03-26 ENCOUNTER — Other Ambulatory Visit: Payer: Self-pay

## 2018-03-26 ENCOUNTER — Ambulatory Visit (INDEPENDENT_AMBULATORY_CARE_PROVIDER_SITE_OTHER): Payer: Medicare Other | Admitting: Family

## 2018-03-26 VITALS — BP 150/86 | HR 68 | Temp 97.3°F | Resp 16 | Ht 68.0 in | Wt 150.0 lb

## 2018-03-26 DIAGNOSIS — Z940 Kidney transplant status: Secondary | ICD-10-CM

## 2018-03-26 DIAGNOSIS — T82898D Other specified complication of vascular prosthetic devices, implants and grafts, subsequent encounter: Secondary | ICD-10-CM

## 2018-03-26 NOTE — Progress Notes (Signed)
CC:  swelling and mild tenderness in left upper arm that started a few days after the ligation of the AVF  History of Present Illness  Luis Reed is a 57 y.o. (May 04, 1961) male who is s/p Ligation of fistula and excision of fluid at the antecubital space on 03-16-18 by Dr. Arbie Cookey for mega fistula left upper arm brachiocephalic fistula.  He is s/p kidney transplant in 2007 and this is working well.  He returns today with c/o swelling and mild tenderness in left upper arm that started a few days after the ligation of the left arm AVF. Denies any  redness or heat, denies fever or chills.   Past Medical History:  Diagnosis Date  . Hepatitis C   . HIV infection (HCC)   . Malignant hyperthermia   . Renal disorder renal failure    Social History Social History   Tobacco Use  . Smoking status: Former Smoker    Packs/day: 0.30    Years: 20.00    Pack years: 6.00    Types: Cigarettes  . Smokeless tobacco: Never Used  . Tobacco comment: quit smoking cigaettes at age 57  Substance Use Topics  . Alcohol use: No  . Drug use: No    Family History Family History  Problem Relation Age of Onset  . Prostate cancer Father     Surgical History Past Surgical History:  Procedure Laterality Date  . AV FISTULA PLACEMENT    . LIGATION OF ARTERIOVENOUS  FISTULA Left 03/16/2018   Procedure: LIGATION OF ARTERIOVENOUS  FISTULA LEFT ARM;  Surgeon: Larina Earthly, MD;  Location: [facility] OR;  Service: Vascular;  Laterality: Left;  . NEPHRECTOMY TRANSPLANTED ORGAN      Allergies  Allergen Reactions  . Hydrocodone Itching and Other (See Comments)    fidgety    Current Outpatient Medications  Medication Sig Dispense Refill  . calcitRIOL (ROCALTROL) 0.5 MCG capsule Take 0.5 mcg by mouth at bedtime.  3  . CELLCEPT 250 MG capsule Take 250 mg by mouth 2 (two) times daily.  3  . DESCOVY 200-25 MG tablet TAKE 1 TABLET BY MOUTH DAILY. (Patient taking differently: Take 1 tablet by mouth at  bedtime. ) 30 tablet 5  . predniSONE (DELTASONE) 5 MG tablet Take 5 mg by mouth every Monday, Wednesday, and Friday at 8 PM.     . tacrolimus (PROGRAF) 1 MG capsule Take 2 mg by mouth 2 (two) times daily.     . tacrolimus (PROGRAF) 5 MG capsule Take 5 mg by mouth 2 (two) times daily.     Marland Kitchen TIVICAY 50 MG tablet TAKE 1 TABLET BY MOUTH DAILY. (Patient taking differently: Take 50 mg by mouth at bedtime. ) 30 tablet 5  . zolpidem (AMBIEN) 10 MG tablet Take 10 mg by mouth at bedtime as needed for sleep.   1  . oxyCODONE-acetaminophen (PERCOCET/ROXICET) 5-325 MG tablet Take 1 tablet by mouth every 6 (six) hours as needed. (Patient not taking: Reported on 03/26/2018) 6 tablet 0   Current Facility-Administered Medications  Medication Dose Route Frequency Provider Last Rate Last Dose  . betamethasone acetate-betamethasone sodium phosphate (CELESTONE) injection 3 mg  3 mg Intramuscular Once Felecia Shelling, DPM         REVIEW OF SYSTEMS: see HPI for pertinent positives and negatives    PHYSICAL EXAMINATION:  Vitals:   03/26/18 1034  BP: (!) 150/86  Pulse: 68  Resp: 16  Temp: (!) 97.3 F (36.3 C)  TempSrc: Oral  SpO2: 97%  Weight: 150 lb (68 kg)  Height: 5\' 8"  (1.727 m)   Body mass index is 22.81 kg/m.  General: The patient appears his stated age.   HEENT:  No gross abnormalities Pulmonary: Respirations are non-labored Abdomen: Soft and non-tender Musculoskeletal: There are no major deformities.   Neurologic: No focal weakness or paresthesias are detected Skin: There are no ulcer or rashes noted. Psychiatric: The patient has normal affect. Cardiovascular: There is a regular rate and rhythm Bilateral radial pulses are 1+ palpable. Thrombosed aneurysmal left upper arm AV fistula.     Luis Reed is a 57 y.o. male who is s/p Ligation of fistula and excision of fluid at the antecubital space on 03-16-18 by Dr. Arbie CookeyEarly for mega fistula left upper arm brachiocephalic fistula.  He is  s/p kidney transplant in 2007 and this is working well.   I discussed with Dr. Myra GianottiBrabham pt HPI, physical exam results.   The left upper arm AVF has thrombosed as expected after ligation, and the swelling and mild discomfort are from the thrombosed area. Advise warm compresses and acetaminophen OTC.  Follow up with us as needed.   Charisse MarchSuzanne Nickel, RN, MSN, FNP-C [facility] Office: [phone]  03/26/2018, 10:44 AM  Clinic MD: Myra GianottiBrabham

## 2018-03-29 MED FILL — DESCOVY 200-25 MG TABS: 200-25 | 30 days supply | Qty: 30 | Fill #3

## 2018-03-29 MED FILL — TIVICAY 50 MG TABLET: 50 | 30 days supply | Qty: 30 | Fill #3

## 2018-04-30 MED FILL — TIVICAY 50 MG TABLET: 50 | 30 days supply | Qty: 30 | Fill #4

## 2018-04-30 MED FILL — DESCOVY 200-25 MG TABS: 200-25 | 30 days supply | Qty: 30 | Fill #4

## 2018-06-12 MED FILL — TIVICAY 50 MG TABLET: 50 | 30 days supply | Qty: 30 | Fill #5

## 2018-06-12 MED FILL — DESCOVY 200-25 MG TABS: 200-25 | 30 days supply | Qty: 30 | Fill #5

## 2018-07-05 ENCOUNTER — Other Ambulatory Visit: Payer: Self-pay | Admitting: Internal Medicine

## 2018-07-05 DIAGNOSIS — B20 Human immunodeficiency virus [HIV] disease: Secondary | ICD-10-CM

## 2018-07-16 MED FILL — TIVICAY 50 MG TABLET: 50 | 30 days supply | Qty: 30 | Fill #0

## 2018-07-16 MED FILL — DESCOVY 200-25 MG TABS: 200-25 | 30 days supply | Qty: 30 | Fill #0

## 2018-07-25 NOTE — Progress Notes (Signed)
Luis Reed DOB: 13-May-1960 Encounter date: 07/27/2018  This is a 58 y.o. male who presents to establish care. Chief Complaint  Patient presents with  . New Patient (Initial Visit)   No specific concerns today.   Previous provider went into ID: so needed a new primary. Last visit with primary was in 2016.   Hasn't had physical yet this year.   Dx with kidney disease in 2000. Was on dialysis from 2000-2007. Got transplant in 2007. Has done great since that time. Sees [facility] q 6 months.   Seizure history: when he had port put in neck had severe hypotension and had several seizures. Went into a coma for 7 days.   Had UTI in past; not recurrent; never hospitalized.   Plays basketball for exercise.   Urination q 15-30 minutes x 1 month. No dysuria. No abd or flank pain. NO STD exposure. Not waking through night to urinate. Does feel like stream is strong and that he is emptying completely. No discharge or drainage. No blood in urine; no change in color and no odor.   History of present illness:  Hep C: treated in 2011. Treatment completed by Dr. Orvan Falconer. Does still follow with ID q 6 months. Also following with Dr. Orvan Falconer for HIV.    Doesn't always sleep very many hours. Feels rested even with just a few hours of sleep. Sleep medications haven't helped. Did complete sleep study.   Hasn't smoked in 20 years; hasn't had alcohol in 20 years.    Past Medical History:  Diagnosis Date  . Chronic insomnia 07/18/2014  . Hepatitis C   . HIV infection (HCC)   . Malignant hyperthermia   . Renal disorder renal failure  . SEIZURE DISORDER 03/15/2006   Qualifier: History of  By: Orvan Falconer MD, John     Past Surgical History:  Procedure Laterality Date  . AV FISTULA PLACEMENT    . LIGATION OF ARTERIOVENOUS  FISTULA Left 03/16/2018   Procedure: LIGATION OF ARTERIOVENOUS  FISTULA LEFT ARM;  Surgeon: Larina Earthly, MD;  Location: [facility] OR;  Service: Vascular;  Laterality: Left;  .  NEPHRECTOMY TRANSPLANTED ORGAN     Allergies  Allergen Reactions  . Hydrocodone Itching and Other (See Comments)    fidgety   Current Meds  Medication Sig  . calcitRIOL (ROCALTROL) 0.5 MCG capsule Take 0.5 mcg by mouth at bedtime.  . CELLCEPT 250 MG capsule Take 250 mg by mouth 2 (two) times daily.  Marland Kitchen emtricitabine-tenofovir AF (DESCOVY) 200-25 MG tablet Take 1 tablet by mouth at bedtime.  . predniSONE (DELTASONE) 5 MG tablet Take 5 mg by mouth every Monday, Wednesday, and Friday at 8 PM.   . tacrolimus (PROGRAF) 1 MG capsule Take 2 mg by mouth 2 (two) times daily.   . tacrolimus (PROGRAF) 5 MG capsule Take 5 mg by mouth 2 (two) times daily.   Marland Kitchen TIVICAY 50 MG tablet TAKE 1 TABLET BY MOUTH DAILY.  Marland Kitchen zolpidem (AMBIEN) 10 MG tablet Take 10 mg by mouth at bedtime as needed for sleep.   . [DISCONTINUED] oxyCODONE-acetaminophen (PERCOCET/ROXICET) 5-325 MG tablet Take 1 tablet by mouth every 6 (six) hours as needed.   Current Facility-Administered Medications for the 07/27/18 encounter (Office Visit) with Wynn Banker, MD  Medication  . betamethasone acetate-betamethasone sodium phosphate (CELESTONE) injection 3 mg   Social History   Tobacco Use  . Smoking status: Former Smoker    Packs/day: 0.30    Years: 20.00  Pack years: 6.00    Types: Cigarettes  . Smokeless tobacco: Never Used  . Tobacco comment: quit smoking cigaettes at age 70  Substance Use Topics  . Alcohol use: No   Family History  Problem Relation Age of Onset  . Prostate cancer Father 39     Review of Systems  Constitutional: Negative for chills, fatigue and fever.  Respiratory: Negative for cough, chest tightness, shortness of breath and wheezing.   Cardiovascular: Negative for chest pain, palpitations and leg swelling.  Genitourinary: Positive for frequency. Negative for decreased urine volume, difficulty urinating, discharge, dysuria and urgency.    Objective:  BP 140/70 (BP Location: Left Arm,  Patient Position: Sitting, Cuff Size: Normal)   Pulse 64   Temp 98.1 F (36.7 C) (Oral)   Ht 5\' 8"  (1.727 m)   Wt 168 lb 3.2 oz (76.3 kg)   SpO2 96%   BMI 25.57 kg/m   Weight: 168 lb 3.2 oz (76.3 kg)   BP Readings from Last 3 Encounters:  07/27/18 140/70  03/26/18 (!) 150/86  03/16/18 (!) 153/86   Wt Readings from Last 3 Encounters:  07/27/18 168 lb 3.2 oz (76.3 kg)  03/26/18 150 lb (68 kg)  03/16/18 160 lb (72.6 kg)    Physical Exam Constitutional:      General: He is not in acute distress.    Appearance: He is well-developed.  Cardiovascular:     Rate and Rhythm: Normal rate and regular rhythm.     Heart sounds: Normal heart sounds. No murmur. No friction rub.  Pulmonary:     Effort: Pulmonary effort is normal. No respiratory distress.     Breath sounds: Normal breath sounds. No wheezing or rales.  Abdominal:     General: Abdomen is flat. Bowel sounds are normal. There is no distension.     Palpations: Abdomen is soft.     Tenderness: There is no guarding or rebound.  Genitourinary:    Comments: Patient declined genital/prostate exam. Musculoskeletal:     Right lower leg: No edema.     Left lower leg: No edema.  Neurological:     Mental Status: He is alert and oriented to person, place, and time.  Psychiatric:        Behavior: Behavior normal.     Assessment/Plan:  1. Urinary frequency Was told UA was negative, but there is some protein present. Will await culture results but make patient aware so that he can discuss with nephrology. - POCT urinalysis dipstick  2. Colon cancer screening  - Ambulatory referral to Gastroenterology  3. Family history of prostate cancer  - PSA  4. Essential (primary) hypertension States that he has not been treated for years and typically runs in 120's systolic. I encouraged home checking for him and recommended following closely as we do not want to risk htn with transplanted kidney.   5. KIDNEY TRANSPLANTATION See  above; follows with transplant surgeon.  6. HIV: follows with ID and has been tolerating treatment well/stable.  Return in about 6 months (around 01/27/2019) for physical exam.  Theodis Shove, MD

## 2018-07-27 ENCOUNTER — Ambulatory Visit (INDEPENDENT_AMBULATORY_CARE_PROVIDER_SITE_OTHER): Payer: Medicare HMO | Admitting: Family Medicine

## 2018-07-27 ENCOUNTER — Other Ambulatory Visit: Payer: Self-pay

## 2018-07-27 ENCOUNTER — Encounter: Payer: Self-pay | Admitting: Family Medicine

## 2018-07-27 VITALS — BP 140/70 | HR 64 | Temp 98.1°F | Ht 68.0 in | Wt 168.2 lb

## 2018-07-27 DIAGNOSIS — Z1211 Encounter for screening for malignant neoplasm of colon: Secondary | ICD-10-CM

## 2018-07-27 DIAGNOSIS — R350 Frequency of micturition: Secondary | ICD-10-CM | POA: Diagnosis not present

## 2018-07-27 DIAGNOSIS — B20 Human immunodeficiency virus [HIV] disease: Secondary | ICD-10-CM

## 2018-07-27 DIAGNOSIS — Z8042 Family history of malignant neoplasm of prostate: Secondary | ICD-10-CM

## 2018-07-27 DIAGNOSIS — Z940 Kidney transplant status: Secondary | ICD-10-CM

## 2018-07-27 DIAGNOSIS — I10 Essential (primary) hypertension: Secondary | ICD-10-CM | POA: Diagnosis not present

## 2018-07-27 LAB — POCT URINALYSIS DIPSTICK
Bilirubin, UA: NEGATIVE
Glucose, UA: NEGATIVE
Ketones, UA: NEGATIVE
LEUKOCYTES UA: NEGATIVE
NITRITE UA: NEGATIVE
PH UA: 6 (ref 5.0–8.0)
PROTEIN UA: POSITIVE — AB
RBC UA: NEGATIVE
Spec Grav, UA: 1.02 (ref 1.010–1.025)
UROBILINOGEN UA: 0.2 U/dL

## 2018-07-27 NOTE — Patient Instructions (Signed)
DASH Eating Plan  DASH stands for "Dietary Approaches to Stop Hypertension." The DASH eating plan is a healthy eating plan that has been shown to reduce high blood pressure (hypertension). It may also reduce your risk for type 2 diabetes, heart disease, and stroke. The DASH eating plan may also help with weight loss.  What are tips for following this plan?    General guidelines   Avoid eating more than 2,300 mg (milligrams) of salt (sodium) a day. If you have hypertension, you may need to reduce your sodium intake to 1,500 mg a day.   Limit alcohol intake to no more than 1 drink a day for nonpregnant women and 2 drinks a day for men. One drink equals 12 oz of beer, 5 oz of wine, or 1 oz of hard liquor.   Work with your health care provider to maintain a healthy body weight or to lose weight. Ask what an ideal weight is for you.   Get at least 30 minutes of exercise that causes your heart to beat faster (aerobic exercise) most days of the week. Activities may include walking, swimming, or biking.   Work with your health care provider or diet and nutrition specialist (dietitian) to adjust your eating plan to your individual calorie needs.  Reading food labels     Check food labels for the amount of sodium per serving. Choose foods with less than 5 percent of the Daily Value of sodium. Generally, foods with less than 300 mg of sodium per serving fit into this eating plan.   To find whole grains, look for the word "whole" as the first word in the ingredient list.  Shopping   Buy products labeled as "low-sodium" or "no salt added."   Buy fresh foods. Avoid canned foods and premade or frozen meals.  Cooking   Avoid adding salt when cooking. Use salt-free seasonings or herbs instead of table salt or sea salt. Check with your health care provider or pharmacist before using salt substitutes.   Do not fry foods. Cook foods using healthy methods such as baking, boiling, grilling, and broiling instead.   Cook with  heart-healthy oils, such as olive, canola, soybean, or sunflower oil.  Meal planning   Eat a balanced diet that includes:  ? 5 or more servings of fruits and vegetables each day. At each meal, try to fill half of your plate with fruits and vegetables.  ? Up to 6-8 servings of whole grains each day.  ? Less than 6 oz of lean meat, poultry, or fish each day. A 3-oz serving of meat is about the same size as a deck of cards. One egg equals 1 oz.  ? 2 servings of low-fat dairy each day.  ? A serving of nuts, seeds, or beans 5 times each week.  ? Heart-healthy fats. Healthy fats called Omega-3 fatty acids are found in foods such as flaxseeds and coldwater fish, like sardines, salmon, and mackerel.   Limit how much you eat of the following:  ? Canned or prepackaged foods.  ? Food that is high in trans fat, such as fried foods.  ? Food that is high in saturated fat, such as fatty meat.  ? Sweets, desserts, sugary drinks, and other foods with added sugar.  ? Full-fat dairy products.   Do not salt foods before eating.   Try to eat at least 2 vegetarian meals each week.   Eat more home-cooked food and less restaurant, buffet, and fast food.     When eating at a restaurant, ask that your food be prepared with less salt or no salt, if possible.  What foods are recommended?  The items listed may not be a complete list. Talk with your dietitian about what dietary choices are best for you.  Grains  Whole-grain or whole-wheat bread. Whole-grain or whole-wheat pasta. Brown rice. Oatmeal. Quinoa. Bulgur. Whole-grain and low-sodium cereals. Pita bread. Low-fat, low-sodium crackers. Whole-wheat flour tortillas.  Vegetables  Fresh or frozen vegetables (raw, steamed, roasted, or grilled). Low-sodium or reduced-sodium tomato and vegetable juice. Low-sodium or reduced-sodium tomato sauce and tomato paste. Low-sodium or reduced-sodium canned vegetables.  Fruits  All fresh, dried, or frozen fruit. Canned fruit in natural juice (without  added sugar).  Meat and other protein foods  Skinless chicken or turkey. Ground chicken or turkey. Pork with fat trimmed off. Fish and seafood. Egg whites. Dried beans, peas, or lentils. Unsalted nuts, nut butters, and seeds. Unsalted canned beans. Lean cuts of beef with fat trimmed off. Low-sodium, lean deli meat.  Dairy  Low-fat (1%) or fat-free (skim) milk. Fat-free, low-fat, or reduced-fat cheeses. Nonfat, low-sodium ricotta or cottage cheese. Low-fat or nonfat yogurt. Low-fat, low-sodium cheese.  Fats and oils  Soft margarine without trans fats. Vegetable oil. Low-fat, reduced-fat, or light mayonnaise and salad dressings (reduced-sodium). Canola, safflower, olive, soybean, and sunflower oils. Avocado.  Seasoning and other foods  Herbs. Spices. Seasoning mixes without salt. Unsalted popcorn and pretzels. Fat-free sweets.  What foods are not recommended?  The items listed may not be a complete list. Talk with your dietitian about what dietary choices are best for you.  Grains  Baked goods made with fat, such as croissants, muffins, or some breads. Dry pasta or rice meal packs.  Vegetables  Creamed or fried vegetables. Vegetables in a cheese sauce. Regular canned vegetables (not low-sodium or reduced-sodium). Regular canned tomato sauce and paste (not low-sodium or reduced-sodium). Regular tomato and vegetable juice (not low-sodium or reduced-sodium). Pickles. Olives.  Fruits  Canned fruit in a light or heavy syrup. Fried fruit. Fruit in cream or butter sauce.  Meat and other protein foods  Fatty cuts of meat. Ribs. Fried meat. Bacon. Sausage. Bologna and other processed lunch meats. Salami. Fatback. Hotdogs. Bratwurst. Salted nuts and seeds. Canned beans with added salt. Canned or smoked fish. Whole eggs or egg yolks. Chicken or turkey with skin.  Dairy  Whole or 2% milk, cream, and half-and-half. Whole or full-fat cream cheese. Whole-fat or sweetened yogurt. Full-fat cheese. Nondairy creamers. Whipped toppings.  Processed cheese and cheese spreads.  Fats and oils  Butter. Stick margarine. Lard. Shortening. Ghee. Bacon fat. Tropical oils, such as coconut, palm kernel, or palm oil.  Seasoning and other foods  Salted popcorn and pretzels. Onion salt, garlic salt, seasoned salt, table salt, and sea salt. Worcestershire sauce. Tartar sauce. Barbecue sauce. Teriyaki sauce. Soy sauce, including reduced-sodium. Steak sauce. Canned and packaged gravies. Fish sauce. Oyster sauce. Cocktail sauce. Horseradish that you find on the shelf. Ketchup. Mustard. Meat flavorings and tenderizers. Bouillon cubes. Hot sauce and Tabasco sauce. Premade or packaged marinades. Premade or packaged taco seasonings. Relishes. Regular salad dressings.  Where to find more information:   National Heart, Lung, and Blood Institute: www.nhlbi.nih.gov   American Heart Association: www.heart.org  Summary   The DASH eating plan is a healthy eating plan that has been shown to reduce high blood pressure (hypertension). It may also reduce your risk for type 2 diabetes, heart disease, and stroke.   With the   DASH eating plan, you should limit salt (sodium) intake to 2,300 mg a day. If you have hypertension, you may need to reduce your sodium intake to 1,500 mg a day.   When on the DASH eating plan, aim to eat more fresh fruits and vegetables, whole grains, lean proteins, low-fat dairy, and heart-healthy fats.   Work with your health care provider or diet and nutrition specialist (dietitian) to adjust your eating plan to your individual calorie needs.  This information is not intended to replace advice given to you by your health care provider. Make sure you discuss any questions you have with your health care provider.  Document Released: 04/14/2011 Document Revised: 04/18/2016 Document Reviewed: 04/18/2016  Elsevier Interactive Patient Education  2019 [employer] Inc[employer]

## 2018-08-08 MED FILL — TIVICAY 50 MG TABLET: 50 | 30 days supply | Qty: 30 | Fill #1

## 2018-08-08 MED FILL — DESCOVY 200-25 MG TABS: 200-25 | 30 days supply | Qty: 30 | Fill #1

## 2018-08-14 ENCOUNTER — Other Ambulatory Visit: Payer: Self-pay

## 2018-08-14 ENCOUNTER — Encounter: Payer: Self-pay | Admitting: Family Medicine

## 2018-08-14 ENCOUNTER — Ambulatory Visit (INDEPENDENT_AMBULATORY_CARE_PROVIDER_SITE_OTHER): Payer: Medicare HMO | Admitting: Family Medicine

## 2018-08-14 ENCOUNTER — Ambulatory Visit: Payer: Medicare HMO | Admitting: Family Medicine

## 2018-08-14 DIAGNOSIS — I10 Essential (primary) hypertension: Secondary | ICD-10-CM

## 2018-08-14 DIAGNOSIS — Z940 Kidney transplant status: Secondary | ICD-10-CM | POA: Diagnosis not present

## 2018-08-14 NOTE — Progress Notes (Signed)
Subjective:    Patient ID: Luis HagemanWalter L Potier, male    DOB: 1961-05-09, 58 y.o.   MRN: [id number]  HPI Virtual Visit via Video Note  I connected with the patient on 08/14/18 at  9:15 AM EDT by a video enabled telemedicine application and verified that I am speaking with the correct person using two identifiers.  Location patient: home Location provider:work or home office Persons participating in the virtual visit: patient, provider  I discussed the limitations of evaluation and management by telemedicine and the availability of in person appointments. The patient expressed understanding and agreed to proceed.   HPI: He has questions about his BP. He sees Dr. Abel Prestoolodonato for ESRD and he has had a renal transplant. His creatinine has been stable in the range of 1.5 to 1.6. he last saw Dr. Abel Prestoolodonato in January, and his BP was borderline then at 140/70. He follows this at home as well since he has a strong family hx of HTN, and he has been getting readings of 135-140 over 65 to 70. He feels good. He exercises regularly by walking and playing basketball. He does not use tobacco, he watches the salt in his diet. His weight has been stable.    ROS: See pertinent positives and negatives per HPI.  Past Medical History:  Diagnosis Date  . Chronic insomnia 07/18/2014  . Hepatitis C   . HIV infection (HCC)   . Malignant hyperthermia   . Renal disorder renal failure  . SEIZURE DISORDER 03/15/2006   Qualifier: History of  By: Orvan Falconerampbell MD, John      Past Surgical History:  Procedure Laterality Date  . AV FISTULA PLACEMENT    . LIGATION OF ARTERIOVENOUS  FISTULA Left 03/16/2018   Procedure: LIGATION OF ARTERIOVENOUS  FISTULA LEFT ARM;  Surgeon: Larina EarthlyEarly, Todd F, MD;  Location: [facility] OR;  Service: Vascular;  Laterality: Left;  . NEPHRECTOMY TRANSPLANTED ORGAN      Family History  Problem Relation Age of Onset  . Prostate cancer Father 270     Current Outpatient Medications:  .  calcitRIOL  (ROCALTROL) 0.5 MCG capsule, Take 0.5 mcg by mouth at bedtime., Disp: , Rfl: 3 .  CELLCEPT 250 MG capsule, Take 250 mg by mouth 2 (two) times daily., Disp: , Rfl: 3 .  emtricitabine-tenofovir AF (DESCOVY) 200-25 MG tablet, Take 1 tablet by mouth at bedtime., Disp: 30 tablet, Rfl: 5 .  predniSONE (DELTASONE) 5 MG tablet, Take 5 mg by mouth every Monday, Wednesday, and Friday at 8 PM. , Disp: , Rfl:  .  tacrolimus (PROGRAF) 1 MG capsule, Take 2 mg by mouth 2 (two) times daily. , Disp: , Rfl:  .  tacrolimus (PROGRAF) 5 MG capsule, Take 5 mg by mouth 2 (two) times daily. , Disp: , Rfl:  .  TIVICAY 50 MG tablet, TAKE 1 TABLET BY MOUTH DAILY., Disp: 30 tablet, Rfl: 5 .  zolpidem (AMBIEN) 10 MG tablet, Take 10 mg by mouth at bedtime as needed for sleep. , Disp: , Rfl: 1  Current Facility-Administered Medications:  .  betamethasone acetate-betamethasone sodium phosphate (CELESTONE) injection 3 mg, 3 mg, Intramuscular, Once, Evans, Larena GlassmanBrent M, DPM  EXAM:  VITALS per patient if applicable:  GENERAL: alert, oriented, appears well and in no acute distress  HEENT: atraumatic, conjunttiva clear, no obvious abnormalities on inspection of external nose and ears  NECK: normal movements of the head and neck  LUNGS: on inspection no signs of respiratory distress, breathing rate appears normal, no  obvious gross SOB, gasping or wheezing  CV: no obvious cyanosis  MS: moves all visible extremities without noticeable abnormality  PSYCH/NEURO: pleasant and cooperative, no obvious depression or anxiety, speech and thought processing grossly intact  ASSESSMENT AND PLAN: I told him that I agree his BP has been going up somewhat and that now he is at the borderline of HTN, but I do not think he needs to be on medication at this time. He will see Dr. Abel Presto again in 3 weeks, and he will discuss this with him in more detail. He is scheduled for a well exam with Dr. Hassan Rowan in September.  Gershon Crane, MD   Discussed the following assessment and plan:  No diagnosis found.     I discussed the assessment and treatment plan with the patient. The patient was provided an opportunity to ask questions and all were answered. The patient agreed with the plan and demonstrated an understanding of the instructions.   The patient was advised to call back or seek an in-person evaluation if the symptoms worsen or if the condition fails to improve as anticipated.     Review of Systems     Objective:   Physical Exam        Assessment & Plan:

## 2018-09-03 MED FILL — TIVICAY 50 MG TABLET: 50 | 30 days supply | Qty: 30 | Fill #2

## 2018-09-03 MED FILL — DESCOVY 200-25 MG TABS: 200-25 | 30 days supply | Qty: 30 | Fill #2

## 2018-10-05 MED FILL — TIVICAY 50 MG TABLET: 50 | 30 days supply | Qty: 30 | Fill #3

## 2018-10-05 MED FILL — DESCOVY 200-25 MG TABS: 200-25 | 30 days supply | Qty: 30 | Fill #3

## 2018-10-22 ENCOUNTER — Encounter: Payer: Self-pay | Admitting: Gastroenterology

## 2018-11-05 MED FILL — TIVICAY 50 MG TABLET: 50 | 30 days supply | Qty: 30 | Fill #4

## 2018-11-05 MED FILL — DESCOVY 200-25 MG TABS: 200-25 | 30 days supply | Qty: 30 | Fill #4

## 2018-11-20 ENCOUNTER — Ambulatory Visit: Payer: Self-pay | Admitting: *Deleted

## 2018-11-20 ENCOUNTER — Other Ambulatory Visit: Payer: Self-pay

## 2018-11-20 ENCOUNTER — Telehealth: Payer: Self-pay | Admitting: *Deleted

## 2018-11-20 VITALS — Ht 68.0 in | Wt 170.0 lb

## 2018-11-20 DIAGNOSIS — Z1211 Encounter for screening for malignant neoplasm of colon: Secondary | ICD-10-CM

## 2018-11-20 DIAGNOSIS — T883XXA Malignant hyperthermia due to anesthesia, initial encounter: Secondary | ICD-10-CM | POA: Insufficient documentation

## 2018-11-20 MED ORDER — PEG 3350-KCL-NA BICARB-NACL 420 G PO SOLR: 4000.0000 mL | Freq: Once | ORAL | 0 refills | Status: AC

## 2018-11-20 NOTE — Telephone Encounter (Signed)
I did a PV on this pt this morning- he informed me he had Malignant hyperthermia in 2007 with his kidney transplant-  He is scheduled for a colon in the [facility] 12-04-2018, Tuesday   Can he have his procedure  in the [place]?  Please advise   thanks for your time, Lelan Pons

## 2018-11-20 NOTE — Telephone Encounter (Signed)
Lelan Pons,  This pt needs to have his procedure in the hospital.  Thanks,  Osvaldo Angst

## 2018-11-20 NOTE — Progress Notes (Signed)
No egg or soy allergy known to patient   issues with past sedation with any surgeries  or procedures- pt states in 2007 he had Malignant Hyperthermia with his kidney transplant- TE to [facility] about this issue- pt aware may have to be changed to a Hospital case due to this issue , no intubation problems  No diet pills per patient No home 02 use per patient  No blood thinners per patient  Pt denies issues with constipation  No A fib or A flutter  EMMI video sent to pt's e mail   Pt verified name, DOB, address and insurance during PV today. Pt mailed instruction packet to included paper to complete and mail back to [facility] with addressed and stamped envelope, Emmi video, copy of consent form to read and not return, and instructions.PV completed over the phone. Pt encouraged to call with questions or issues   Pt is aware that care partner will wait in the car during procedure; if they feel like they will be too hot to wait in the car; they may wait in the lobby.  We want them to wear a mask (we do not have any that we can provide them), practice social distancing, and we will check their temperatures when they get here.  I did remind patient that their care partner needs to stay in the parking lot the entire time. Pt will wear mask into building.

## 2018-11-20 NOTE — Telephone Encounter (Signed)
Dr Fuller Plan,  Do you want this pt to have an Office Visit before his colon due to the Malignant Hyperthermia?   His last colon was in 2004 with only tics and hems  He has a hx of HepC, HIV, CVA, HEP B ,kidney transplant, TBI, HTN, seizure d/o, last seizure 2000 per pt, and the Malignany hyperthermia.  Please advise, Thanks for your time, Marijean Niemann

## 2018-11-21 NOTE — Telephone Encounter (Signed)
Please schedule office visit with me or APP.

## 2018-11-21 NOTE — Telephone Encounter (Signed)
Tried to schedule office visit in person, was blocked several times. Informed pt that Dr Fuller Plan wants to see him in the office to discuss the colon Will try to schedule appt and let pt know the day and time in the am. Gwyndolyn Saxon in [facility].

## 2018-11-22 NOTE — Telephone Encounter (Signed)
OV 8-4 at 0830 am With [place] for APP per Dr Lynne Leader note  On 7-14 TE- 12-04-2018 colon cancelled - pt aware reason for OV Malignant Hyperthermia at kidney transplant and NO [facility] colon per Jenny Reichmann- will have to be Hospital case

## 2018-12-03 MED FILL — DESCOVY 200-25 MG TABS: 200-25 | 30 days supply | Qty: 30 | Fill #5

## 2018-12-03 MED FILL — TIVICAY 50 MG TABLET: 50 | 30 days supply | Qty: 30 | Fill #5

## 2018-12-04 ENCOUNTER — Encounter: Payer: Medicare HMO | Admitting: Gastroenterology

## 2018-12-11 ENCOUNTER — Ambulatory Visit: Payer: Medicare HMO | Admitting: Nurse Practitioner

## 2018-12-26 ENCOUNTER — Other Ambulatory Visit: Payer: Self-pay | Admitting: Internal Medicine

## 2018-12-26 DIAGNOSIS — B20 Human immunodeficiency virus [HIV] disease: Secondary | ICD-10-CM

## 2018-12-26 NOTE — Telephone Encounter (Signed)
Needs office visit.

## 2018-12-31 MED FILL — DESCOVY 200-25 MG TABS: 200-25 | 30 days supply | Qty: 30 | Fill #0

## 2018-12-31 MED FILL — TIVICAY 50 MG TABLET: 50 | 30 days supply | Qty: 30 | Fill #0

## 2019-01-03 ENCOUNTER — Ambulatory Visit: Payer: Medicare HMO | Admitting: Nurse Practitioner

## 2019-01-30 ENCOUNTER — Other Ambulatory Visit: Payer: Self-pay | Admitting: Internal Medicine

## 2019-01-30 DIAGNOSIS — B20 Human immunodeficiency virus [HIV] disease: Secondary | ICD-10-CM

## 2019-01-31 ENCOUNTER — Other Ambulatory Visit: Payer: Self-pay | Admitting: Physician Assistant

## 2019-01-31 ENCOUNTER — Other Ambulatory Visit: Payer: Self-pay

## 2019-01-31 ENCOUNTER — Other Ambulatory Visit: Payer: Medicare HMO

## 2019-01-31 ENCOUNTER — Other Ambulatory Visit (HOSPITAL_COMMUNITY)
Admission: RE | Admit: 2019-01-31 | Discharge: 2019-01-31 | Disposition: A | Discharge status: Home / Self Care | Payer: Medicare HMO | Source: Ambulatory Visit | Attending: Internal Medicine | Admitting: Internal Medicine

## 2019-01-31 DIAGNOSIS — Z113 Encounter for screening for infections with a predominantly sexual mode of transmission: Secondary | ICD-10-CM | POA: Insufficient documentation

## 2019-01-31 DIAGNOSIS — Z79899 Other long term (current) drug therapy: Secondary | ICD-10-CM

## 2019-01-31 DIAGNOSIS — B20 Human immunodeficiency virus [HIV] disease: Secondary | ICD-10-CM

## 2019-01-31 MED FILL — TIVICAY 50 MG TABLET: 50 | 30 days supply | Qty: 30 | Fill #1

## 2019-01-31 MED FILL — DESCOVY 200-25 MG TABS: 200-25 | 30 days supply | Qty: 30 | Fill #0

## 2019-02-01 ENCOUNTER — Encounter: Payer: Self-pay | Admitting: Family Medicine

## 2019-02-01 ENCOUNTER — Ambulatory Visit (INDEPENDENT_AMBULATORY_CARE_PROVIDER_SITE_OTHER): Payer: Medicare HMO | Admitting: Family Medicine

## 2019-02-01 VITALS — BP 132/80 | HR 62 | Temp 97.9°F | Ht 68.25 in | Wt 166.3 lb

## 2019-02-01 DIAGNOSIS — I10 Essential (primary) hypertension: Secondary | ICD-10-CM | POA: Diagnosis not present

## 2019-02-01 DIAGNOSIS — B20 Human immunodeficiency virus [HIV] disease: Secondary | ICD-10-CM

## 2019-02-01 DIAGNOSIS — Z0000 Encounter for general adult medical examination without abnormal findings: Secondary | ICD-10-CM | POA: Diagnosis not present

## 2019-02-01 DIAGNOSIS — Z8042 Family history of malignant neoplasm of prostate: Secondary | ICD-10-CM | POA: Diagnosis not present

## 2019-02-01 DIAGNOSIS — Z940 Kidney transplant status: Secondary | ICD-10-CM

## 2019-02-01 LAB — PSA: PSA: 0.92 ng/mL (ref 0.10–4.00)

## 2019-02-01 LAB — T-HELPER CELL (CD4) - (RCID CLINIC ONLY)
CD4 % Helper T Cell: 40 % (ref 33–65)
CD4 T Cell Abs: 804 /uL (ref 400–1790)

## 2019-02-01 MED ORDER — SHINGRIX 50 MCG/0.5ML IM SUSR: 0.5000 mL | Freq: Once | INTRAMUSCULAR | 0 refills | Status: AC

## 2019-02-01 NOTE — Addendum Note (Signed)
Addended by: Suzette Battiest on: 02/01/2019 08:43 AM   Modules accepted: Orders

## 2019-02-01 NOTE — Progress Notes (Signed)
Luis Reed DOB: April 03, 1961 Encounter date: 02/01/2019  This is a 58 y.o. male who presents for complete physical   History of present illness/Additional concerns: HTN: recent visit April with Dr. Clent Ridges with concerns of elevating blood pressure. Currently on amlodipine 5mg . Getting numbers around 132/80 at home. Nothing much higher.   Last visit with me was 07/2018.  Dx with kidney disease in 2000. Was on dialysis from 2000-2007. Got transplant in 2007. Has done great since that time. Sees [facility] q 6 months.   Seizure history: when he had port put in neck had severe hypotension and had several seizures. Went into a coma for 7 days.   Hep C treated in 2011 by Dr. 2012. Follows with ID q 6 months.   Last visit was referred to GI for colon cancer screening. I do not see that this was completed. He spoke with them and they are getting back with him. He wanted to make sure that they were taking precautions due to his transplanted kidney.   Last bloodwork was yesterday. Cbc stable, creat stable at 1.8,  Lipid stable. Calcium low at 8. Usually takes tums, but hasn't been doing this recently. Hasn't seen the chewable ones in stores recently.   Past Medical History:  Diagnosis Date  . Blood transfusion without reported diagnosis   . Chronic insomnia 07/18/2014  . Hepatitis C   . HIV infection (HCC)   . Hypertension   . Malignant hyperthermia   . Renal disorder renal failure   sees Dr. 09/17/2014   . SEIZURE DISORDER 03/15/2006   Qualifier: History of  By: 13/11/2005 MD, Orvan Falconer  - pt states last seizure 2000   Past Surgical History:  Procedure Laterality Date  . AV FISTULA PLACEMENT    . COLONOSCOPY  2004  . LIGATION OF ARTERIOVENOUS  FISTULA Left 03/16/2018   Procedure: LIGATION OF ARTERIOVENOUS  FISTULA LEFT ARM;  Surgeon: 13/12/2017, MD;  Location: [facility] OR;  Service: Vascular;  Laterality: Left;  . NEPHRECTOMY TRANSPLANTED ORGAN     Allergies  Allergen Reactions  .  Hydrocodone Itching and Other (See Comments)    fidgety   Current Meds  Medication Sig  . amLODipine (NORVASC) 5 MG tablet Take 5 mg by mouth daily.  . bisacodyl (DULCOLAX) 5 MG EC tablet Take 5 mg by mouth once. X 4 tabs for colon prep 7-28  . calcitRIOL (ROCALTROL) 0.5 MCG capsule Take 0.5 mcg by mouth at bedtime.  . CELLCEPT 250 MG capsule Take 250 mg by mouth 2 (two) times daily.  . DESCOVY 200-25 MG tablet TAKE 1 TABLET BY MOUTH AT BEDTIME.  . predniSONE (DELTASONE) 5 MG tablet Take 5 mg by mouth every Monday, Wednesday, and Friday at 8 PM.   . tacrolimus (PROGRAF) 1 MG capsule Take 2 mg by mouth 2 (two) times daily.   . tacrolimus (PROGRAF) 5 MG capsule Take 5 mg by mouth 2 (two) times daily.   Friday TIVICAY 50 MG tablet TAKE 1 TABLET BY MOUTH DAILY.  Marland Kitchen zolpidem (AMBIEN) 10 MG tablet Take 10 mg by mouth at bedtime as needed for sleep.    Current Facility-Administered Medications for the 02/01/19 encounter (Office Visit) with 02/03/19, MD  Medication  . betamethasone acetate-betamethasone sodium phosphate (CELESTONE) injection 3 mg   Social History   Tobacco Use  . Smoking status: Former Smoker    Packs/day: 0.30    Years: 20.00    Pack years: 6.00  Types: Cigarettes  . Smokeless tobacco: Never Used  . Tobacco comment: quit smoking cigaettes at age 62  Substance Use Topics  . Alcohol use: No   Family History  Problem Relation Age of Onset  . Prostate cancer Father 21  . Colon cancer Neg Hx   . Colon polyps Neg Hx   . Esophageal cancer Neg Hx   . Rectal cancer Neg Hx   . Stomach cancer Neg Hx      Review of Systems  Constitutional: Negative for activity change, appetite change, chills, fatigue, fever and unexpected weight change.  HENT: Negative for congestion, ear pain, hearing loss, sinus pressure, sinus pain, sore throat and trouble swallowing.   Eyes: Negative for pain and visual disturbance.  Respiratory: Negative for cough, chest tightness, shortness  of breath and wheezing.   Cardiovascular: Negative for chest pain, palpitations and leg swelling.  Gastrointestinal: Negative for abdominal distention, abdominal pain, blood in stool, constipation, diarrhea, nausea and vomiting.  Genitourinary: Negative for decreased urine volume, difficulty urinating, dysuria, penile pain and testicular pain.  Musculoskeletal: Negative for arthralgias, back pain and joint swelling.  Skin: Negative for rash.  Neurological: Negative for dizziness, weakness, numbness and headaches.  Hematological: Negative for adenopathy. Does not bruise/bleed easily.  Psychiatric/Behavioral: Negative for agitation, sleep disturbance and suicidal ideas. The patient is not nervous/anxious.     CBC:  Lab Results  Component Value Date   WBC 4.9 01/31/2019   HGB 14.3 01/31/2019   HCT 42.3 01/31/2019   MCH 32.6 01/31/2019   MCHC 33.8 01/31/2019   RDW 12.4 01/31/2019   PLT 212 01/31/2019   MPV 10.2 01/31/2019   CMP: Lab Results  Component Value Date   NA 139 01/31/2019   K 3.6 01/31/2019   CL 104 01/31/2019   CO2 26 01/31/2019   ANIONGAP 9 08/12/2017   GLUCOSE 96 01/31/2019   BUN 16 01/31/2019   CREATININE 1.84 (H) 01/31/2019   GFRAA 46 (L) 11/06/2017   CALCIUM 8.0 (L) 01/31/2019   CALCIUM 6.9 (L) 02/01/2011   PROT 6.9 01/31/2019   BILITOT 0.6 01/31/2019   ALKPHOS 55 08/12/2017   ALT 9 01/31/2019   ALT 22 07/14/2014   AST 15 01/31/2019   LIPID: Lab Results  Component Value Date   CHOL 131 01/31/2019   TRIG 85 01/31/2019   HDL 66 01/31/2019   LDLCALC 48 01/31/2019    Objective:  BP 132/80 (BP Location: Right Arm, Patient Position: Sitting, Cuff Size: Normal)   Pulse 62   Temp 97.9 F (36.6 C) (Temporal)   Ht 5' 8.25" (1.734 m)   Wt 166 lb 4.8 oz (75.4 kg)   SpO2 97%   BMI 25.10 kg/m   Weight: 166 lb 4.8 oz (75.4 kg)   BP Readings from Last 3 Encounters:  02/01/19 132/80  07/27/18 140/70  03/26/18 (!) 150/86   Wt Readings from Last 3  Encounters:  02/01/19 166 lb 4.8 oz (75.4 kg)  11/20/18 170 lb (77.1 kg)  07/27/18 168 lb 3.2 oz (76.3 kg)    Physical Exam Constitutional:      General: He is not in acute distress.    Appearance: He is well-developed.  HENT:     Head: Normocephalic and atraumatic.     Right Ear: External ear normal.     Left Ear: External ear normal.     Nose: Nose normal.     Mouth/Throat:     Pharynx: No oropharyngeal exudate.  Eyes:  Conjunctiva/sclera: Conjunctivae normal.     Pupils: Pupils are equal, round, and reactive to light.  Neck:     Musculoskeletal: Neck supple.     Thyroid: No thyromegaly.  Cardiovascular:     Rate and Rhythm: Normal rate and regular rhythm.     Heart sounds: Normal heart sounds. No murmur. No friction rub. No gallop.   Pulmonary:     Effort: Pulmonary effort is normal. No respiratory distress.     Breath sounds: Normal breath sounds. No stridor. No wheezing or rales.  Abdominal:     General: Bowel sounds are normal.     Palpations: Abdomen is soft.  Musculoskeletal: Normal range of motion.  Skin:    General: Skin is warm and dry.  Neurological:     Mental Status: He is alert and oriented to person, place, and time.  Psychiatric:        Behavior: Behavior normal.        Thought Content: Thought content normal.        Judgment: Judgment normal.     Assessment/Plan: Health Maintenance Due  Topic Date Due  . INFLUENZA VACCINE  12/08/2018   Health Maintenance reviewed.  1. Preventative health care Will get influenza at next doc visit next week (specialist).   2. Human immunodeficiency virus (HIV) disease (HCC) Follows with ID regularly.  3. KIDNEY TRANSPLANTATION Follows with tranplant doctor q 6 months. Has been stable.   4. Essential (primary) hypertension Stable on amlodipine. Continue medication.  5. Family history of prostate cancer - PSA; Future  Return in about 6 months (around 08/01/2019).  Theodis ShoveJunell Khyra Viscuso, MD

## 2019-02-01 NOTE — Patient Instructions (Signed)
It was nice to see you today.   Don't forget to complete your flu shot. I have sent an order for the shingles vaccine to your pharmacy for you.   I will call you with prostate lab results when I get them.

## 2019-02-04 LAB — URINE CYTOLOGY ANCILLARY ONLY
Chlamydia: NEGATIVE
Neisseria Gonorrhea: NEGATIVE

## 2019-02-06 LAB — COMPREHENSIVE METABOLIC PANEL
AG Ratio: 1.5 (calc) (ref 1.0–2.5)
ALT: 9 U/L (ref 9–46)
AST: 15 U/L (ref 10–35)
Albumin: 4.1 g/dL (ref 3.6–5.1)
Alkaline phosphatase (APISO): 59 U/L (ref 35–144)
BUN/Creatinine Ratio: 9 (calc) (ref 6–22)
BUN: 16 mg/dL (ref 7–25)
CO2: 26 mmol/L (ref 20–32)
Calcium: 8 mg/dL — ABNORMAL LOW (ref 8.6–10.3)
Chloride: 104 mmol/L (ref 98–110)
Creat: 1.84 mg/dL — ABNORMAL HIGH (ref 0.70–1.33)
Globulin: 2.8 g/dL (calc) (ref 1.9–3.7)
Glucose, Bld: 96 mg/dL (ref 65–99)
Potassium: 3.6 mmol/L (ref 3.5–5.3)
Sodium: 139 mmol/L (ref 135–146)
Total Bilirubin: 0.6 mg/dL (ref 0.2–1.2)
Total Protein: 6.9 g/dL (ref 6.1–8.1)

## 2019-02-06 LAB — CBC WITH DIFFERENTIAL/PLATELET
Absolute Monocytes: 377 cells/uL (ref 200–950)
Basophils Absolute: 49 cells/uL (ref 0–200)
Basophils Relative: 1 %
Eosinophils Absolute: 59 cells/uL (ref 15–500)
Eosinophils Relative: 1.2 %
HCT: 42.3 % (ref 38.5–50.0)
Hemoglobin: 14.3 g/dL (ref 13.2–17.1)
Lymphs Abs: 2181 cells/uL (ref 850–3900)
MCH: 32.6 pg (ref 27.0–33.0)
MCHC: 33.8 g/dL (ref 32.0–36.0)
MCV: 96.6 fL (ref 80.0–100.0)
MPV: 10.2 fL (ref 7.5–12.5)
Monocytes Relative: 7.7 %
Neutro Abs: 2234 cells/uL (ref 1500–7800)
Neutrophils Relative %: 45.6 %
Platelets: 212 10*3/uL (ref 140–400)
RBC: 4.38 10*6/uL (ref 4.20–5.80)
RDW: 12.4 % (ref 11.0–15.0)
Total Lymphocyte: 44.5 %
WBC: 4.9 10*3/uL (ref 3.8–10.8)

## 2019-02-06 LAB — RPR: RPR Ser Ql: NONREACTIVE

## 2019-02-06 LAB — LIPID PANEL
Cholesterol: 131 mg/dL (ref <200)
HDL: 66 mg/dL (ref >=40)
LDL Cholesterol (Calc): 48 mg/dL (calc)
Non-HDL Cholesterol (Calc): 65 mg/dL (calc) (ref <130)
Total CHOL/HDL Ratio: 2 (calc) (ref <5.0)
Triglycerides: 85 mg/dL (ref <150)

## 2019-02-06 LAB — HIV-1 RNA QUANT-NO REFLEX-BLD
HIV 1 RNA Quant: <20 copies/mL
HIV-1 RNA Quant, Log: <1.3 Log copies/mL

## 2019-02-12 ENCOUNTER — Encounter: Payer: Self-pay | Admitting: Internal Medicine

## 2019-02-12 ENCOUNTER — Ambulatory Visit (INDEPENDENT_AMBULATORY_CARE_PROVIDER_SITE_OTHER): Payer: Medicare HMO | Admitting: Internal Medicine

## 2019-02-12 ENCOUNTER — Other Ambulatory Visit: Payer: Self-pay

## 2019-02-12 DIAGNOSIS — B20 Human immunodeficiency virus [HIV] disease: Secondary | ICD-10-CM

## 2019-02-12 NOTE — Assessment & Plan Note (Signed)
His infection remains under excellent, long-term control.  He received his influenza vaccine today.  He will stay on his current antiretroviral regimen and follow-up after lab work in 1 year.

## 2019-02-12 NOTE — Progress Notes (Signed)
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   BISACODYL (DULCOLAX) 5 MG EC TABLET    Take 5 mg by mouth once. X 4 tabs for colon prep 7-28   CALCITRIOL (ROCALTROL) 0.5 MCG CAPSULE    Take 0.5 mcg by mouth at bedtime.   CELLCEPT 250 MG CAPSULE    Take 250 mg by mouth 2 (two) times daily.   DESCOVY 200-25 MG TABLET    TAKE 1 TABLET BY MOUTH AT BEDTIME.   PREDNISONE (DELTASONE) 5 MG TABLET    Take 5 mg by mouth every Monday, Wednesday, and Friday at 8 PM.    TACROLIMUS (PROGRAF) 1 MG CAPSULE    Take 2 mg by mouth 2 (two) times daily.    TACROLIMUS (PROGRAF) 5 MG CAPSULE    Take 5 mg by mouth 2 (two) times daily.    TIVICAY 50 MG TABLET    TAKE 1 TABLET BY MOUTH DAILY.   ZOLPIDEM (AMBIEN) 10 MG TABLET    Take 10 mg by mouth at bedtime as needed for sleep.   Modified Medications   No medications on file  Discontinued Medications   No medications on file    Subjective: [place] is in for his routine HIV follow-up visit.  He has had no  problems obtaining, taking or tolerating his Descovy or Tivicay.  He takes them each morning and never misses a single dose.  He is feeling well.  He continues to work as normal through the COVID pandemic but feels that he is safe.  He was recently started on amlodipine.  He is on no other new medications.  Review of Systems: Review of Systems  Constitutional: Negative for fever.  Respiratory: Negative for cough and shortness of breath.   Cardiovascular: Negative for chest pain.  Gastrointestinal: Negative for abdominal pain, diarrhea, nausea and vomiting.  Psychiatric/Behavioral: Negative for depression.    Past Medical History:  Diagnosis Date   Blood transfusion without reported diagnosis    Chronic insomnia 07/18/2014   Hepatitis C    HIV infection (HCC)    Hypertension    Malignant hyperthermia    Renal disorder renal failure   sees Dr. Dierdre Highman    SEIZURE DISORDER 03/15/2006   Qualifier: History of  By: Orvan Falconer MD, Yassmine Tamm  - pt states last seizure 2000    Social History   Tobacco Use   Smoking status: Former Smoker    Packs/day: 0.30    Years: 20.00    Pack years: 6.00  Types: Cigarettes   Smokeless tobacco: Never Used   Tobacco comment: quit smoking cigaettes at age 40  Substance Use Topics   Alcohol use: No   Drug use: No    Family History  Problem Relation Age of Onset   Prostate cancer Father 41   Colon cancer Neg Hx    Colon polyps Neg Hx    Esophageal cancer Neg Hx    Rectal cancer Neg Hx    Stomach cancer Neg Hx     Allergies  Allergen Reactions   Hydrocodone Itching and Other (See Comments)    fidgety    Health Maintenance  Topic Date Due   INFLUENZA VACCINE  12/08/2018   COLONOSCOPY  09/08/2019   TETANUS/TDAP  11/07/2022   Hepatitis C Screening  Completed   HIV Screening  Completed    Objective:  Vitals:   02/12/19 1043  BP: 129/90  Pulse: 72  Temp: 98 F (36.7 C)   There is no height or weight  on file to calculate BMI.  Physical Exam Constitutional:      Comments: He is quiet and reserved as usual but in good spirits.  Cardiovascular:     Rate and Rhythm: Normal rate and regular rhythm.     Heart sounds: No murmur.  Pulmonary:     Effort: Pulmonary effort is normal.     Breath sounds: Normal breath sounds.  Psychiatric:        Mood and Affect: Mood normal.     Lab Results Lab Results  Component Value Date   WBC 4.9 01/31/2019   HGB 14.3 01/31/2019   HCT 42.3 01/31/2019   MCV 96.6 01/31/2019   PLT 212 01/31/2019    Lab Results  Component Value Date   CREATININE 1.84 (H) 01/31/2019   BUN 16 01/31/2019   NA 139 01/31/2019   K 3.6 01/31/2019   CL 104 01/31/2019   CO2 26 01/31/2019    Lab Results  Component Value Date   ALT 9 01/31/2019   AST 15 01/31/2019   ALKPHOS 55 08/12/2017   BILITOT 0.6 01/31/2019    Lab Results  Component Value Date   CHOL 131 01/31/2019   HDL 66 01/31/2019   LDLCALC 48 01/31/2019   TRIG 85 01/31/2019   CHOLHDL 2.0 01/31/2019   Lab Results  Component Value Date   LABRPR NON-REACTIVE 01/31/2019   HIV 1 RNA Quant (copies/mL)  Date Value  01/31/2019 <20 NOT DETECTED  11/06/2017 <20 NOT DETECTED  12/01/2016 <20 NOT DETECTED   CD4 T Cell Abs (/uL)  Date Value  01/31/2019 804  11/06/2017 710  12/01/2016 810     Problem List Items Addressed This Visit      High   Human immunodeficiency virus (HIV) disease ([place])    His infection remains under excellent, long-term control.  He received his influenza vaccine today.  He will stay on his current antiretroviral regimen and follow-up after lab work in 1 year.      Relevant Orders   CBC   T-helper cell (CD4)- ([facility] clinic only)   Comprehensive metabolic panel   Lipid panel   RPR   HIV-1 RNA quant-no reflex-bld        Michel Bickers, MD [facility] for [place] 336 [phone] pager   [phone] cell 02/12/2019, 10:55 AM

## 2019-02-27 ENCOUNTER — Other Ambulatory Visit: Payer: Self-pay | Admitting: Internal Medicine

## 2019-02-27 DIAGNOSIS — B20 Human immunodeficiency virus [HIV] disease: Secondary | ICD-10-CM

## 2019-02-28 MED FILL — DESCOVY 200-25 MG TABS: 200-25 | 30 days supply | Qty: 30 | Fill #0

## 2019-02-28 MED FILL — TIVICAY 50 MG TABLET: 50 | 30 days supply | Qty: 30 | Fill #2

## 2019-03-28 MED FILL — TIVICAY 50 MG TABLET: 50 | 30 days supply | Qty: 30 | Fill #3

## 2019-03-28 MED FILL — DESCOVY 200-25 MG TABS: 200-25 | 30 days supply | Qty: 30 | Fill #1

## 2019-04-30 MED FILL — DESCOVY 200-25 MG TABS: 200-25 | 30 days supply | Qty: 30 | Fill #2

## 2019-04-30 MED FILL — TIVICAY 50 MG TABLET: 50 | 30 days supply | Qty: 30 | Fill #4

## 2019-05-16 ENCOUNTER — Other Ambulatory Visit: Payer: Self-pay

## 2019-05-16 ENCOUNTER — Emergency Department (HOSPITAL_COMMUNITY)
Admission: EM | Admit: 2019-05-16 | Discharge: 2019-05-16 | Disposition: A | Discharge status: Home / Self Care | Payer: Medicare HMO | Attending: Emergency Medicine | Admitting: Emergency Medicine

## 2019-05-16 ENCOUNTER — Encounter (HOSPITAL_COMMUNITY): Payer: Self-pay | Admitting: Emergency Medicine

## 2019-05-16 ENCOUNTER — Emergency Department (HOSPITAL_COMMUNITY): Payer: Medicare HMO

## 2019-05-16 DIAGNOSIS — Z87891 Personal history of nicotine dependence: Secondary | ICD-10-CM | POA: Diagnosis not present

## 2019-05-16 DIAGNOSIS — Z79899 Other long term (current) drug therapy: Secondary | ICD-10-CM | POA: Diagnosis not present

## 2019-05-16 DIAGNOSIS — R509 Fever, unspecified: Secondary | ICD-10-CM

## 2019-05-16 DIAGNOSIS — I10 Essential (primary) hypertension: Secondary | ICD-10-CM | POA: Insufficient documentation

## 2019-05-16 DIAGNOSIS — Z20822 Contact with and (suspected) exposure to covid-19: Secondary | ICD-10-CM

## 2019-05-16 DIAGNOSIS — U071 COVID-19: Secondary | ICD-10-CM | POA: Insufficient documentation

## 2019-05-16 DIAGNOSIS — Z940 Kidney transplant status: Secondary | ICD-10-CM | POA: Diagnosis not present

## 2019-05-16 LAB — CBC WITH DIFFERENTIAL/PLATELET
Abs Immature Granulocytes: 0.01 10*3/uL (ref 0.00–0.07)
Basophils Absolute: 0 10*3/uL (ref 0.0–0.1)
Basophils Relative: 0 %
Eosinophils Absolute: 0 10*3/uL (ref 0.0–0.5)
Eosinophils Relative: 0 %
HCT: 43.4 % (ref 39.0–52.0)
Hemoglobin: 14.7 g/dL (ref 13.0–17.0)
Immature Granulocytes: 0 %
Lymphocytes Relative: 29 %
Lymphs Abs: 1.5 10*3/uL (ref 0.7–4.0)
MCH: 33 pg (ref 26.0–34.0)
MCHC: 33.9 g/dL (ref 30.0–36.0)
MCV: 97.5 fL (ref 80.0–100.0)
Monocytes Absolute: 0.5 10*3/uL (ref 0.1–1.0)
Monocytes Relative: 11 %
Neutro Abs: 2.9 10*3/uL (ref 1.7–7.7)
Neutrophils Relative %: 60 %
Platelets: 135 10*3/uL — ABNORMAL LOW (ref 150–400)
RBC: 4.45 MIL/uL (ref 4.22–5.81)
RDW: 11.8 % (ref 11.5–15.5)
WBC: 5 10*3/uL (ref 4.0–10.5)
nRBC: 0 % (ref 0.0–0.2)

## 2019-05-16 LAB — URINALYSIS, ROUTINE W REFLEX MICROSCOPIC
Bacteria, UA: NONE SEEN
Bilirubin Urine: NEGATIVE
Glucose, UA: NEGATIVE mg/dL
Hgb urine dipstick: NEGATIVE
Ketones, ur: 20 mg/dL — AB
Leukocytes,Ua: NEGATIVE
Nitrite: NEGATIVE
Protein, ur: 100 mg/dL — AB
Specific Gravity, Urine: 1.023 (ref 1.005–1.030)
pH: 6 (ref 5.0–8.0)

## 2019-05-16 LAB — BASIC METABOLIC PANEL
Anion gap: 11 (ref 5–15)
BUN: 20 mg/dL (ref 6–20)
CO2: 24 mmol/L (ref 22–32)
Calcium: 7.6 mg/dL — ABNORMAL LOW (ref 8.9–10.3)
Chloride: 99 mmol/L (ref 98–111)
Creatinine, Ser: 1.86 mg/dL — ABNORMAL HIGH (ref 0.61–1.24)
GFR calc Af Amer: 45 mL/min — ABNORMAL LOW (ref >60)
GFR calc non Af Amer: 38 mL/min — ABNORMAL LOW (ref >60)
Glucose, Bld: 97 mg/dL (ref 70–99)
Potassium: 4 mmol/L (ref 3.5–5.1)
Sodium: 134 mmol/L — ABNORMAL LOW (ref 135–145)

## 2019-05-16 LAB — LACTIC ACID, PLASMA: Lactic Acid, Venous: 0.8 mmol/L (ref 0.5–1.9)

## 2019-05-16 MED ORDER — SODIUM CHLORIDE 0.9 % IV BOLUS: 1000.0000 mL | Freq: Once | INTRAVENOUS | Status: DC

## 2019-05-16 MED ORDER — ACETAMINOPHEN 325 MG PO TABS
650.0000 mg | ORAL_TABLET | Freq: Once | ORAL | Status: AC
  Administered 2019-05-16: 650 mg via ORAL
  Filled 2019-05-16: qty 2

## 2019-05-16 MED ORDER — SODIUM CHLORIDE 0.9 % IV BOLUS
1000.0000 mL | Freq: Once | INTRAVENOUS | Status: AC
  Administered 2019-05-16: 1000 mL via INTRAVENOUS

## 2019-05-16 NOTE — ED Notes (Signed)
Patient Alert and oriented to baseline. Stable and ambulatory to baseline. Patient verbalized understanding of the discharge instructions.  Patient belongings were taken by the patient.   

## 2019-05-16 NOTE — ED Provider Notes (Signed)
[facility] EMERGENCY DEPARTMENT Provider Note   CSN: [id number] Arrival date & time: 05/16/19  0546     History Chief Complaint  Patient presents with  . Dysuria    Luis Reed is a 59 y.o. male hx of HIV (CD 4 normal, viral load undetectable in 9/20), hypertension, previous history of UTI here presenting with chills, fever.  Patient had a fever yesterday and chills .  Patient states that he just feels achy all over .  He has no dysuria or hematuria or frequency. But he states that he had previous episodes of chills and it turns out to be UTI. Patient denies any abdominal pain or flank pain or vomiting .  Patient denies any sick contacts and has no cough. Patient in particular denies any known Covid contacts.  The history is provided by the patient.       Past Medical History:  Diagnosis Date  . Blood transfusion without reported diagnosis   . Chronic insomnia 07/18/2014  . Hepatitis C   . HIV infection ([place])   . Hypertension   . Malignant hyperthermia   . Renal disorder renal failure   sees Dr. Katrine Coho   . SEIZURE DISORDER 03/15/2006   Qualifier: History of  By: Megan Salon MD, Jenny Reichmann  - pt states last seizure 2000    Patient Active Problem List   Diagnosis Date Noted  . Malignant hyperthermia   . Medicare annual wellness visit, subsequent 03/23/2015  . Other specified anxiety disorders 07/18/2014  . RA (retrograde amnesia) 07/18/2014  . TBI (traumatic brain injury) ([place]) 07/18/2014  . Post concussive syndrome 05/13/2014  . 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 Essential (primary) hypertension 06/06/2011  . KIDNEY TRANSPLANTATION 07/11/2007  . Hepatitis C virus infection without hepatic coma 03/15/2006  . SYPHILIS 03/15/2006  . HYDROCELE NOS 03/15/2006  . ALCOHOL ABUSE, HX OF 03/15/2006  . HEPATITIS B, HX OF 03/15/2006  . TOBACCO USE, QUIT 03/15/2006  . PARATHYROIDECTOMY 08/08/2002  . CEREBROVASCULAR ACCIDENT, ACUTE 10/08/1999  .  Human immunodeficiency virus (HIV) disease ([place]) 05/10/1999    Past Surgical History:  Procedure Laterality Date  . AV FISTULA PLACEMENT    . COLONOSCOPY  2004  . LIGATION OF ARTERIOVENOUS  FISTULA Left 03/16/2018   Procedure: LIGATION OF ARTERIOVENOUS  FISTULA LEFT ARM;  Surgeon: Rosetta Posner, MD;  Location: [facility];  Service: Vascular;  Laterality: Left;  . NEPHRECTOMY TRANSPLANTED ORGAN         Family History  Problem Relation Age of Onset  . Prostate cancer Father 81  . Colon cancer Neg Hx   . Colon polyps Neg Hx   . Esophageal cancer Neg Hx   . Rectal cancer Neg Hx   . Stomach cancer Neg Hx     Social History   Tobacco Use  . Smoking status: Former Smoker    Packs/day: 0.30    Years: 20.00    Pack years: 6.00    Types: Cigarettes  . Smokeless tobacco: Never Used  . Tobacco comment: quit smoking cigaettes at age 10  Substance Use Topics  . Alcohol use: No  . Drug use: No    Home Medications Prior to Admission medications   Medication Sig Start Date End Date Taking? Authorizing Provider  amLODipine (NORVASC) 5 MG tablet Take 5 mg by mouth daily. 10/26/18   [provider]  bisacodyl (DULCOLAX) 5 MG EC tablet Take 5 mg by mouth once. X 4 tabs for colon prep  7-28    [provider]  calcitRIOL (ROCALTROL) 0.5 MCG capsule Take 0.5 mcg by mouth at bedtime. 12/19/17   [provider]  CELLCEPT 250 MG capsule Take 250 mg by mouth 2 (two) times daily. 01/29/14   [provider]  DESCOVY 200-25 MG tablet TAKE 1 TABLET BY MOUTH AT BEDTIME. 02/27/19   Cliffton Asters, MD  predniSONE (DELTASONE) 5 MG tablet Take 5 mg by mouth every Monday, Wednesday, and Friday at 8 PM.     [provider]  tacrolimus (PROGRAF) 1 MG capsule Take 2 mg by mouth 2 (two) times daily.     [provider]  tacrolimus (PROGRAF) 5 MG capsule Take 5 mg by mouth 2 (two) times daily.     [provider]  TIVICAY 50 MG tablet TAKE 1 TABLET BY  MOUTH DAILY. 12/26/18   Cliffton Asters, MD  zolpidem (AMBIEN) 10 MG tablet Take 10 mg by mouth at bedtime as needed for sleep.  11/03/17   [provider]    Allergies    Hydrocodone  Review of Systems   Review of Systems  Constitutional: Positive for chills and fever.  All other systems reviewed and are negative.   Physical Exam Updated Vital Signs BP 136/83   Pulse (!) 58   Temp 98.9 F (37.2 C) (Oral)   Resp 16   SpO2 96%   Physical Exam Vitals and nursing note reviewed.  HENT:     Head: Normocephalic.     Nose: Nose normal.     Mouth/Throat:     Mouth: Mucous membranes are moist.  Eyes:     Extraocular Movements: Extraocular movements intact.     Pupils: Pupils are equal, round, and reactive to light.  Cardiovascular:     Rate and Rhythm: Normal rate and regular rhythm.     Pulses: Normal pulses.  Pulmonary:     Effort: Pulmonary effort is normal.     Breath sounds: Normal breath sounds.  Abdominal:     General: Abdomen is flat.     Palpations: Abdomen is soft.     Comments: No abdominal tenderness, no CVAT   Musculoskeletal:        General: Normal range of motion.     Cervical back: Normal range of motion.  Skin:    General: Skin is warm.     Capillary Refill: Capillary refill takes less than 2 seconds.  Neurological:     General: No focal deficit present.     Mental Status: He is alert and oriented to person, place, and time.  Psychiatric:        Mood and Affect: Mood normal.        Behavior: Behavior normal.     ED Results / Procedures / Treatments   Labs (all labs ordered are listed, but only abnormal results are displayed) Labs Reviewed  CBC WITH DIFFERENTIAL/PLATELET - Abnormal; Notable for the following components:      Result Value   Platelets 135 (*)    All other components within normal limits  BASIC METABOLIC PANEL - Abnormal; Notable for the following components:   Sodium 134 (*)    Creatinine, Ser 1.86 (*)    Calcium 7.6 (*)     GFR calc non Af Amer 38 (*)    GFR calc Af Amer 45 (*)    All other components within normal limits  URINALYSIS, ROUTINE W REFLEX MICROSCOPIC - Abnormal; Notable for the following components:   Ketones, ur  20 (*)    Protein, ur 100 (*)    All other components within normal limits  URINE CULTURE  CULTURE, BLOOD (ROUTINE X 2)  CULTURE, BLOOD (ROUTINE X 2)  LACTIC ACID, PLASMA  LACTIC ACID, PLASMA    EKG None  Radiology DG Chest Port 1 View  Result Date: 05/16/2019 CLINICAL DATA:  Dysuria.  Fever. EXAM: PORTABLE CHEST 1 VIEW COMPARISON:  04/26/2014 FINDINGS: Mild right hemidiaphragm elevation. Midline trachea. Normal heart size and mediastinal contours. No pleural effusion or pneumothorax. Clear lungs. IMPRESSION: No active disease. Electronically Signed   By: Jeronimo Greaves M.D.   On: 05/16/2019 10:30    Procedures Procedures (including critical care time)  Medications Ordered in ED Medications  acetaminophen (TYLENOL) tablet 650 mg (650 mg Oral Given 05/16/19 0601)  sodium chloride 0.9 % bolus 1,000 mL (1,000 mLs Intravenous New Bag/Given 05/16/19 6378)    ED Course  I have reviewed the triage vital signs and the nursing notes.  Pertinent labs & imaging results that were available during my care of the patient were reviewed by me and considered in my medical decision making (see chart for details).    MDM Rules/Calculators/A&P                      Luis Reed is a 59 y.o. male here with fever. Febrile 102 in the ED. Well appearing, no meningeal signs. Lungs clear, abdomen nontender. I think likely UTI vs pneumonia vs viral syndrome such as COVID. Not hypoxic. His HIV is well controlled and I doubt opportunistic infections. Will get labs, lactate, cultures, UA, CXR.   10:41 AM WBC nl. Lactate nl. CXR and UA clear. Temp down to 98. No meningeal signs. Well appearing. Consider possible COVID. Given that he is not hypoxic and appears well, he can get outpatient test and told  him to stay home until results come back. He can continue tylenol, motrin for fever   Luis Reed was evaluated in Emergency Department on 05/16/2019 for the symptoms described in the history of present illness. He was evaluated in the context of the global COVID-19 pandemic, which necessitated consideration that the patient might be at risk for infection with the SARS-CoV-2 virus that causes COVID-19. Institutional protocols and algorithms that pertain to the evaluation of patients at risk for COVID-19 are in a state of rapid change based on information released by regulatory bodies including the CDC and federal and state organizations. These policies and algorithms were followed during the patient's care in the ED.  Final Clinical Impression(s) / ED Diagnoses Final diagnoses:  None    Rx / DC Orders ED Discharge Orders    None       Charlynne Pander, MD 05/16/19 1042

## 2019-05-16 NOTE — ED Triage Notes (Signed)
Patient reports dysuria onset yesterday he suspects UTI states similar symptoms with his UTI in the past , febrile at triage .

## 2019-05-16 NOTE — Discharge Instructions (Signed)
Take tylenol, motrin for fever   Continue your current meds   You are tested for COVID. Stay home until the results come back.   Stay hydrated   See your doctor  Return to ER if you have fever for a week, worse chills, abdominal pain, vomiting, trouble breathing.      Person Under Monitoring Name: Luis Reed  Location: [address] [place] [address]   Infection Prevention Recommendations for Individuals Confirmed to have, or Being Evaluated for, 2019 Novel Coronavirus (COVID-19) Infection Who Receive Care at Home  Individuals who are confirmed to have, or are being evaluated for, COVID-19 should follow the prevention steps below until a healthcare provider or local or state health department says they can return to normal activities.  Stay home except to get medical care You should restrict activities outside your home, except for getting medical care. Do not go to work, school, or public areas, and do not use public transportation or taxis.  Call ahead before visiting your doctor Before your medical appointment, call the healthcare provider and tell them that you have, or are being evaluated for, COVID-19 infection. This will help the healthcare provider's office take steps to keep other people from getting infected. Ask your healthcare provider to call the local or state health department.  Monitor your symptoms Seek prompt medical attention if your illness is worsening (e.g., difficulty breathing). Before going to your medical appointment, call the healthcare provider and tell them that you have, or are being evaluated for, COVID-19 infection. Ask your healthcare provider to call the local or state health department.  Wear a facemask You should wear a facemask that covers your nose and mouth when you are in the same room with other people and when you visit a healthcare provider. People who live with or visit you should also wear a facemask while they are in  the same room with you.  Separate yourself from other people in your home As much as possible, you should stay in a different room from other people in your home. Also, you should use a separate bathroom, if available.  Avoid sharing household items You should not share dishes, drinking glasses, cups, eating utensils, towels, bedding, or other items with other people in your home. After using these items, you should wash them thoroughly with soap and water.  Cover your coughs and sneezes Cover your mouth and nose with a tissue when you cough or sneeze, or you can cough or sneeze into your sleeve. Throw used tissues in a lined trash can, and immediately wash your hands with soap and water for at least 20 seconds or use an alcohol-based hand rub.  Wash your [employer] your hands often and thoroughly with soap and water for at least 20 seconds. You can use an alcohol-based hand sanitizer if soap and water are not available and if your hands are not visibly dirty. Avoid touching your eyes, nose, and mouth with unwashed hands.   Prevention Steps for Caregivers and Household Members of Individuals Confirmed to have, or Being Evaluated for, COVID-19 Infection Being Cared for in the Home  If you live with, or provide care at home for, a person confirmed to have, or being evaluated for, COVID-19 infection please follow these guidelines to prevent infection:  Follow healthcare provider's instructions Make sure that you understand and can help the patient follow any healthcare provider instructions for all care.  Provide for the patient's basic needs You should help the  patient with basic needs in the home and provide support for getting groceries, prescriptions, and other personal needs.  Monitor the patient's symptoms If they are getting sicker, call his or her medical provider and tell them that the patient has, or is being evaluated for, COVID-19 infection. This will help the healthcare  provider's office take steps to keep other people from getting infected. Ask the healthcare provider to call the local or state health department.  Limit the number of people who have contact with the patient If possible, have only one caregiver for the patient. Other household members should stay in another home or place of residence. If this is not possible, they should stay in another room, or be separated from the patient as much as possible. Use a separate bathroom, if available. Restrict visitors who do not have an essential need to be in the home.  Keep older adults, very young children, and other sick people away from the patient Keep older adults, very young children, and those who have compromised immune systems or chronic health conditions away from the patient. This includes people with chronic heart, lung, or kidney conditions, diabetes, and cancer.  Ensure good ventilation Make sure that shared spaces in the home have good air flow, such as from an air conditioner or an opened window, weather permitting.  Wash your hands often Wash your hands often and thoroughly with soap and water for at least 20 seconds. You can use an alcohol based hand sanitizer if soap and water are not available and if your hands are not visibly dirty. Avoid touching your eyes, nose, and mouth with unwashed hands. Use disposable paper towels to dry your hands. If not available, use dedicated cloth towels and replace them when they become wet.  Wear a facemask and gloves Wear a disposable facemask at all times in the room and gloves when you touch or have contact with the patient's blood, body fluids, and/or secretions or excretions, such as sweat, saliva, sputum, nasal mucus, vomit, urine, or feces.  Ensure the mask fits over your nose and mouth tightly, and do not touch it during use. Throw out disposable facemasks and gloves after using them. Do not reuse. Wash your hands immediately after removing your  facemask and gloves. If your personal clothing becomes contaminated, carefully remove clothing and launder. Wash your hands after handling contaminated clothing. Place all used disposable facemasks, gloves, and other waste in a lined container before disposing them with other household waste. Remove gloves and wash your hands immediately after handling these items.  Do not share dishes, glasses, or other household items with the patient Avoid sharing household items. You should not share dishes, drinking glasses, cups, eating utensils, towels, bedding, or other items with a patient who is confirmed to have, or being evaluated for, COVID-19 infection. After the person uses these items, you should wash them thoroughly with soap and water.  Wash laundry thoroughly Immediately remove and wash clothes or bedding that have blood, body fluids, and/or secretions or excretions, such as sweat, saliva, sputum, nasal mucus, vomit, urine, or feces, on them. Wear gloves when handling laundry from the patient. Read and follow directions on labels of laundry or clothing items and detergent. In general, wash and dry with the warmest temperatures recommended on the label.  Clean all areas the individual has used often Clean all touchable surfaces, such as counters, tabletops, doorknobs, bathroom fixtures, toilets, phones, keyboards, tablets, and bedside tables, every day. Also, clean any surfaces that  may have blood, body fluids, and/or secretions or excretions on them. Wear gloves when cleaning surfaces the patient has come in contact with. Use a diluted bleach solution (e.g., dilute bleach with 1 part bleach and 10 parts water) or a household disinfectant with a label that says EPA-registered for coronaviruses. To make a bleach solution at home, add 1 tablespoon of bleach to 1 quart (4 cups) of water. For a larger supply, add  cup of bleach to 1 gallon (16 cups) of water. Read labels of cleaning products and  follow recommendations provided on product labels. Labels contain instructions for safe and effective use of the cleaning product including precautions you should take when applying the product, such as wearing gloves or eye protection and making sure you have good ventilation during use of the product. Remove gloves and wash hands immediately after cleaning.  Monitor yourself for signs and symptoms of illness Caregivers and household members are considered close contacts, should monitor their health, and will be asked to limit movement outside of the home to the extent possible. Follow the monitoring steps for close contacts listed on the symptom monitoring form.   ? If you have additional questions, contact your local health department or call the epidemiologist on call at [phone] (available 24/7). ? This guidance is subject to change. For the most up-to-date guidance from [facility], please refer to their website: [email or web address].pl

## 2019-05-17 LAB — URINE CULTURE: Culture: <10000 — AB

## 2019-05-18 ENCOUNTER — Telehealth: Payer: Self-pay | Admitting: Unknown Physician Specialty

## 2019-05-18 NOTE — Telephone Encounter (Signed)
Discussed with patient about Covid symptoms and the use of bamlanivimab, a monoclonal antibody infusion for those with mild to moderate Covid symptoms and at a high risk of hospitalization.    Pt is not qualified for this infusion at the [facility] infusion center due to lack of symptoms.   

## 2019-05-20 ENCOUNTER — Other Ambulatory Visit: Payer: Self-pay

## 2019-05-20 ENCOUNTER — Telehealth (INDEPENDENT_AMBULATORY_CARE_PROVIDER_SITE_OTHER): Payer: Medicare HMO | Admitting: Family Medicine

## 2019-05-20 DIAGNOSIS — U071 COVID-19: Secondary | ICD-10-CM | POA: Diagnosis not present

## 2019-05-20 NOTE — Progress Notes (Signed)
Virtual Visit via Video Note  I connected with the patient on 05/20/19 at  4:15 PM EST by a video enabled telemedicine application and verified that I am speaking with the correct person using two identifiers.  Location patient: home Location provider:work or home office Persons participating in the virtual visit: patient, provider  I discussed the limitations of evaluation and management by telemedicine and the availability of in person appointments. The patient expressed understanding and agreed to proceed.   HPI: Here to follow up an ER visit on 05-16-19 for several days of fever to 102 degrees, chills, body aches, and fatigue. No cough or SOB or NVD. He has HIV which is well controlled. His CD4 count is normal and viral load is undetectable. That day his labs were normal but he did test positive for the Covid-19 virus. Blood and urine cultures were negative. Since then he has been quarantining at home, drinking fluids, and resting. Today he feels better with no fevers. He still has some lingering headaches and fatigue. He is eating and drinking normally.    ROS: See pertinent positives and negatives per HPI.  Past Medical History:  Diagnosis Date  . Blood transfusion without reported diagnosis   . Chronic insomnia 07/18/2014  . Hepatitis C   . HIV infection ([facility])   . Hypertension   . Malignant hyperthermia   . Renal disorder renal failure   sees Dr. Dierdre Highman   . SEIZURE DISORDER 03/15/2006   Qualifier: History of  By: Orvan Falconer MD, [name]  - pt states last seizure 2000    Past Surgical History:  Procedure Laterality Date  . AV FISTULA PLACEMENT    . COLONOSCOPY  2004  . LIGATION OF ARTERIOVENOUS  FISTULA Left 03/16/2018   Procedure: LIGATION OF ARTERIOVENOUS  FISTULA LEFT ARM;  Surgeon: Larina Earthly, MD;  Location: [facility];  Service: Vascular;  Laterality: Left;  . NEPHRECTOMY TRANSPLANTED ORGAN      Family History  Problem Relation Age of Onset  . Prostate cancer Father  55  . Colon cancer Neg Hx   . Colon polyps Neg Hx   . Esophageal cancer Neg Hx   . Rectal cancer Neg Hx   . Stomach cancer Neg Hx      Current Outpatient Medications:  .  amLODipine (NORVASC) 5 MG tablet, Take 5 mg by mouth daily., Disp: , Rfl:  .  bisacodyl (DULCOLAX) 5 MG EC tablet, Take 5 mg by mouth once. X 4 tabs for colon prep 7-28, Disp: , Rfl:  .  calcitRIOL (ROCALTROL) 0.5 MCG capsule, Take 0.5 mcg by mouth at bedtime., Disp: , Rfl: 3 .  CELLCEPT 250 MG capsule, Take 250 mg by mouth 2 (two) times daily., Disp: , Rfl: 3 .  DESCOVY 200-25 MG tablet, TAKE 1 TABLET BY MOUTH AT BEDTIME., Disp: 30 tablet, Rfl: 5 .  predniSONE (DELTASONE) 5 MG tablet, Take 5 mg by mouth every Monday, Wednesday, and Friday at 8 PM. , Disp: , Rfl:  .  tacrolimus (PROGRAF) 1 MG capsule, Take 2 mg by mouth 2 (two) times daily. , Disp: , Rfl:  .  tacrolimus (PROGRAF) 5 MG capsule, Take 5 mg by mouth 2 (two) times daily. , Disp: , Rfl:  .  TIVICAY 50 MG tablet, TAKE 1 TABLET BY MOUTH DAILY., Disp: 30 tablet, Rfl: 5 .  zolpidem (AMBIEN) 10 MG tablet, Take 10 mg by mouth at bedtime as needed for sleep. , Disp: , Rfl: 1  Current Facility-Administered  Medications:  .  betamethasone acetate-betamethasone sodium phosphate (CELESTONE) injection 3 mg, 3 mg, Intramuscular, Once, Evans, Dorathy Daft, DPM  EXAM:  VITALS per patient if applicable:  GENERAL: alert, oriented, appears well and in no acute distress  HEENT: atraumatic, conjunttiva clear, no obvious abnormalities on inspection of external nose and ears  NECK: normal movements of the head and neck  LUNGS: on inspection no signs of respiratory distress, breathing rate appears normal, no obvious gross SOB, gasping or wheezing  CV: no obvious cyanosis  MS: moves all visible extremities without noticeable abnormality  PSYCH/NEURO: pleasant and cooperative, no obvious depression or anxiety, speech and thought processing grossly intact  ASSESSMENT AND  PLAN: He is recovering from a Covid infection. He will quarantine for 10 days, and he plans to return to work on 05-27-19.  Alysia Penna, MD  Discussed the following assessment and plan:  No diagnosis found.     I discussed the assessment and treatment plan with the patient. The patient was provided an opportunity to ask questions and all were answered. The patient agreed with the plan and demonstrated an understanding of the instructions.   The patient was advised to call back or seek an in-person evaluation if the symptoms worsen or if the condition fails to improve as anticipated.

## 2019-05-21 LAB — CULTURE, BLOOD (ROUTINE X 2)
Culture: NO GROWTH
Culture: NO GROWTH

## 2019-05-22 LAB — NOVEL CORONAVIRUS, NAA (HOSP ORDER, SEND-OUT TO REF LAB; TAT 18-24 HRS): SARS-CoV-2, NAA: DETECTED — AB

## 2019-05-27 ENCOUNTER — Telehealth: Payer: Self-pay | Admitting: Pharmacy Technician

## 2019-05-27 MED FILL — DESCOVY 200-25 MG TABS: 200-25 | 30 days supply | Qty: 30 | Fill #3

## 2019-05-27 MED FILL — TIVICAY 50 MG TABLET: 50 | 30 days supply | Qty: 30 | Fill #5

## 2019-05-27 NOTE — Telephone Encounter (Signed)
[facility] Patient Advocate Encounter   I was successful in securing patient a $7500 grant from [employer] ([employer]) to provide copayment coverage for Descovy and Tivicay.  This will make the out of pocket cost $0.    The billing information is as follows and has been shared with [facility].   RxBin: [id number] PCN:   [id number] Member ID: [id number] Group ID: [id number] Dates of Eligibility: 05/27/2019 through 05/26/2020  Patient knows to call the office with questions or concerns.  Netty Starring. Dimas Aguas CPhT [facility] Patient [facility] Phone: [phone] Fax:  [phone]

## 2019-06-13 ENCOUNTER — Other Ambulatory Visit: Payer: Self-pay

## 2019-06-13 DIAGNOSIS — Z20822 Contact with and (suspected) exposure to covid-19: Secondary | ICD-10-CM

## 2019-06-14 LAB — NOVEL CORONAVIRUS, NAA: SARS-CoV-2, NAA: NOT DETECTED

## 2019-06-19 ENCOUNTER — Other Ambulatory Visit: Payer: Self-pay | Admitting: Internal Medicine

## 2019-06-19 ENCOUNTER — Other Ambulatory Visit: Payer: Self-pay

## 2019-06-19 DIAGNOSIS — B20 Human immunodeficiency virus [HIV] disease: Secondary | ICD-10-CM

## 2019-06-19 MED ORDER — TIVICAY 50 MG PO TABS: 50.0000 mg | ORAL_TABLET | Freq: Every day | ORAL | 5 refills | Status: DC

## 2019-06-19 MED ORDER — DESCOVY 200-25 MG PO TABS: 1.0000 | ORAL_TABLET | Freq: Every day | ORAL | 5 refills | Status: DC

## 2019-06-26 MED FILL — TIVICAY 50 MG TABLET: 50 | 30 days supply | Qty: 30 | Fill #0

## 2019-06-26 MED FILL — DESCOVY 200-25 MG TABS: 200-25 | 30 days supply | Qty: 30 | Fill #4

## 2019-07-31 ENCOUNTER — Ambulatory Visit: Payer: Medicare HMO | Admitting: Nurse Practitioner

## 2019-08-01 ENCOUNTER — Other Ambulatory Visit: Payer: Self-pay

## 2019-08-01 MED FILL — DESCOVY 200-25 MG TABS: 200-25 | 30 days supply | Qty: 30 | Fill #5

## 2019-08-01 MED FILL — TIVICAY 50 MG TABLET: 50 | 30 days supply | Qty: 30 | Fill #1

## 2019-08-02 ENCOUNTER — Encounter: Payer: Self-pay | Admitting: Family Medicine

## 2019-08-02 ENCOUNTER — Ambulatory Visit (INDEPENDENT_AMBULATORY_CARE_PROVIDER_SITE_OTHER): Payer: Medicare HMO | Admitting: Family Medicine

## 2019-08-02 ENCOUNTER — Ambulatory Visit: Payer: Medicare HMO | Admitting: Family Medicine

## 2019-08-02 VITALS — BP 124/72 | HR 75 | Temp 98.3°F | Ht 68.25 in | Wt 162.6 lb

## 2019-08-02 DIAGNOSIS — Z1211 Encounter for screening for malignant neoplasm of colon: Secondary | ICD-10-CM | POA: Diagnosis not present

## 2019-08-02 DIAGNOSIS — B20 Human immunodeficiency virus [HIV] disease: Secondary | ICD-10-CM | POA: Diagnosis not present

## 2019-08-02 DIAGNOSIS — L0291 Cutaneous abscess, unspecified: Secondary | ICD-10-CM

## 2019-08-02 DIAGNOSIS — I10 Essential (primary) hypertension: Secondary | ICD-10-CM | POA: Diagnosis not present

## 2019-08-02 DIAGNOSIS — Z940 Kidney transplant status: Secondary | ICD-10-CM

## 2019-08-02 MED ORDER — AMOXICILLIN-POT CLAVULANATE 875-125 MG PO TABS: 1.0000 | ORAL_TABLET | Freq: Two times a day (BID) | ORAL | 0 refills | Status: DC

## 2019-08-02 NOTE — Progress Notes (Signed)
Luis Reed DOB: March 08, 1961 Encounter date: 08/02/2019  This is a 59 y.o. male who presents with Chief Complaint  Patient presents with  . Follow-up    History of present illness: One concern today - has a little swelling left side of face. Has been this way for a couple of weeks. Seems like it is getting bigger. Not sore. No underlying issues. No tooth pain or sores. Nothing on skin - no rashes/pustules.   HTN: runs well at home.   Saw kidney specialist last week. Had bloodwork at that time; no concerns.   Saw Dr. Megan Salon in October; follows regulalry.   No blood in stool, overdue for repeat colonoscopy.    Allergies  Allergen Reactions  . Hydrocodone Itching and Other (See Comments)    fidgety   Current Meds  Medication Sig  . amLODipine (NORVASC) 5 MG tablet Take 5 mg by mouth daily.  . bisacodyl (DULCOLAX) 5 MG EC tablet Take 5 mg by mouth once. X 4 tabs for colon prep 7-28  . calcitRIOL (ROCALTROL) 0.5 MCG capsule Take 0.5 mcg by mouth at bedtime.  . CELLCEPT 250 MG capsule Take 250 mg by mouth 2 (two) times daily.  . dolutegravir (TIVICAY) 50 MG tablet Take 1 tablet (50 mg total) by mouth daily.  Marland Kitchen emtricitabine-tenofovir AF (DESCOVY) 200-25 MG tablet Take 1 tablet by mouth at bedtime.  . predniSONE (DELTASONE) 5 MG tablet Take 5 mg by mouth every Monday, Wednesday, and Friday at 8 PM.   . tacrolimus (PROGRAF) 1 MG capsule Take 2 mg by mouth 2 (two) times daily.   . tacrolimus (PROGRAF) 5 MG capsule Take 5 mg by mouth 2 (two) times daily.   Marland Kitchen zolpidem (AMBIEN) 10 MG tablet Take 10 mg by mouth at bedtime as needed for sleep.    Current Facility-Administered Medications for the 08/02/19 encounter (Office Visit) with Caren Macadam, MD  Medication  . betamethasone acetate-betamethasone sodium phosphate (CELESTONE) injection 3 mg    Review of Systems  Constitutional: Negative for chills, fatigue and fever.  HENT: Positive for facial swelling. Negative for  congestion, dental problem, ear discharge, ear pain, hearing loss, mouth sores, nosebleeds, postnasal drip, sinus pressure, sinus pain, sore throat and trouble swallowing.   Respiratory: Negative for cough, chest tightness, shortness of breath and wheezing.   Cardiovascular: Negative for chest pain, palpitations and leg swelling.    Objective:  BP 124/72 (BP Location: Right Arm, Patient Position: Sitting, Cuff Size: Normal)   Pulse 75   Temp 98.3 F (36.8 C) (Temporal)   Ht 5' 8.25" (1.734 m)   Wt 162 lb 9.6 oz (73.8 kg)   SpO2 98%   BMI 24.54 kg/m   Weight: 162 lb 9.6 oz (73.8 kg)   BP Readings from Last 3 Encounters:  08/02/19 124/72  05/16/19 136/83  02/12/19 129/90   Wt Readings from Last 3 Encounters:  08/02/19 162 lb 9.6 oz (73.8 kg)  02/01/19 166 lb 4.8 oz (75.4 kg)  11/20/18 170 lb (77.1 kg)    Physical Exam Constitutional:      General: He is not in acute distress.    Appearance: He is well-developed.  HENT:     Head:      Comments: 2cm induration left labial fold with slight erythema. No palpable fluctuance. Not significantly tender. Cardiovascular:     Rate and Rhythm: Normal rate and regular rhythm.     Heart sounds: Normal heart sounds. No murmur. No friction rub.  Pulmonary:  Effort: Pulmonary effort is normal. No respiratory distress.     Breath sounds: Normal breath sounds. No wheezing or rales.  Musculoskeletal:     Right lower leg: No edema.     Left lower leg: No edema.  Lymphadenopathy:     Head:     Right side of head: No submental or submandibular adenopathy.     Left side of head: No submental or submandibular adenopathy.     Cervical: No cervical adenopathy.     Upper Body:     Right upper body: No supraclavicular adenopathy.     Left upper body: No supraclavicular adenopathy.  Neurological:     Mental Status: He is alert and oriented to person, place, and time.  Psychiatric:        Behavior: Behavior normal.      Assessment/Plan   1. Screening for colon cancer He was very worried about getting colonoscopy, anesthesia, etc, but has agreed to cologuard for screening. - Cologuard  2. Abscess tx with abx. Not ready for drainage at this point. Will check in with him next week in follow up. - amoxicillin-clavulanate (AUGMENTIN) 875-125 MG tablet; Take 1 tablet by mouth 2 (two) times daily.  Dispense: 14 tablet; Refill: 0  3. Essential (primary) hypertension Well controlled. Continue to monitor. Continue current medication.   4. Human immunodeficiency virus (HIV) disease (HCC) Follows regularly with ID. Takes anti-virals as directed.  5. KIDNEY TRANSPLANTATION Follows regularly with nephro. Stable.  Return if symptoms worsen or fail to improve.    Theodis Shove, MD

## 2019-08-02 NOTE — Patient Instructions (Addendum)
Use warm compresses on area to left of nose to help facilitate drainage. Take antibiotic with food as directed. We will check in with you next week to see how area is doing, but please let us know if any worsening.    We are committed to keeping you informed about the COVID-19 vaccine.  As the vaccine continues to become available for each phase, we will ensure that patients who meet the criteria receive the information they need to access vaccination opportunities. Continue to check your MyChart account and [email or web address] for updates.   We are following Luis Reed's phased rollout of the vaccine. Currently vaccines are being administered to health care workers, long term care staff and residents, anyone 61 years of age and older, and frontline essential workers.    [facility] has created mass vaccination clinics in [place], [place] and [place] counties in partnership with [place], [place] and [place] county health departments. Vaccination at these clinics is by appointment only. Walk-ins will NOT be accepted.  NOTICE: All [place] appointments for vaccine are currently full. Please check back as we look to make more opportunities available soon.  [place] is opening appointments weekly, based on the number of vaccine doses provided by the state. To receive email notices about when we will open additional appointments each week, please sign up for our weekly email with information about COVID-19 vaccine appointment availability   There are Currently Three Other Vaccination Opportunities in Our Area:  1. FEMA [employer]	[facility] [place]) Eligible members of the public can make appointments online for a vaccine at the federally supported COVID-19 [employer] in [place] at [employer]. Appointments can be made online by visiting [email or web address].  The FEMA site will operate seven days a  week with the capacity to provide up to 3,000 vaccinations per day, with options for drive-thru service in the parking lot and service at an indoor clinic. The federal government will provide the center's vaccine supply, which will be in addition to supply [place] receives through [employer] from the Centers for Disease Control.  2. [facility] health departments in [facility] service area are also scheduling vaccine appointments for those eligible. Learn more about each county's vaccination efforts in the website links below:  -   - [place]  - [place]  - [place]  - [place]  3. [facility] throughout [place] are offering COVID-19 vaccines to eligible individuals. Click here to schedule appointments and learn more.  Find all vaccine locations throughout the state of [place] at [email or web address]  Vaccine Safety and Effectiveness All three vaccines authorized for emergency use by the U.S. Food and Drug Administration are recommended by the U.S. Centers for Disease Control and Prevention as safe and effective treatments for the prevention of COVID-19. These include the [employer], [employer] and [employer].  Clinical trials involving tens of thousands of people for each vaccine have shown that the vaccines are extremely effective in preventing COVID-19 with no serious safety concerns. Side effects reported include a sore arm at the injection site, fatigue, headache, chills and fever. While side effects are sometimes higher than for a typical flu vaccine, they are lower in many ways than side effects from the leading vaccine to prevent shingles.  Side effects are signs that a vaccine is working and are related to your immune system being stimulated to produce antibodies against infection. Side effects from vaccination are far less  significant than health impacts from COVID-19.  Staying  Informed Pharmacists, infectious disease doctors, critical care nurses and other experts at [facility] continue to speak publicly through media interviews and direct communication with our patients and communities about the safety, effectiveness and importance of vaccines to eliminate COVID-19.  In addition, reliable information on vaccine safety, effectiveness, side effects and more is available on the following websites:  [place] Department of Health and Human Services COVID-19 Vaccine Information Website.  U.S. Centers for Disease Control and Prevention PIRJJ-88 Human resources officer.  Staying Safe We agree with the CDC on what we can do to help our communities get back to normal: Getting "back to normal" is going to take all of our tools. If we use all the tools we have, we stand the best chance of getting our families, communities, schools and workplaces "back to normal" sooner:  Get vaccinated as soon as vaccines become available within the phase of the state's vaccination rollout plan for which you meet the eligibility criteria.  Wear a mask.  Stay 6 feet from others and avoid crowds.  Wash hands often.  Masking, social distancing and hand hygiene should be followed by everyone, including those who have been vaccinated.  For our most current information, please visit [email or web address].

## 2019-08-05 ENCOUNTER — Telehealth: Payer: Self-pay | Admitting: *Deleted

## 2019-08-05 NOTE — Telephone Encounter (Signed)
-----   Message from Wynn Banker, MD sent at 08/04/2019 10:23 PM EDT ----- Please check in with him and make sure infection/swelling in face is improving. If any concerns that need to be addressed, Dr. Caryl Never took a look with me when Mr. Joubert was in the office.

## 2019-08-05 NOTE — Telephone Encounter (Signed)
Left a message for the pt to return my call.  

## 2019-08-06 NOTE — Telephone Encounter (Signed)
Spoke with the pt and he stated the facial swelling is getting better and he will call back once he completes the antibiotic.  Message sent to PCP.

## 2019-08-12 LAB — COLOGUARD: Cologuard: NEGATIVE

## 2019-08-12 NOTE — Telephone Encounter (Signed)
Noted  

## 2019-08-19 LAB — COLOGUARD: COLOGUARD: NEGATIVE

## 2019-08-21 ENCOUNTER — Encounter: Payer: Self-pay | Admitting: Family Medicine

## 2019-08-23 ENCOUNTER — Other Ambulatory Visit: Payer: Self-pay

## 2019-08-26 ENCOUNTER — Other Ambulatory Visit: Payer: Self-pay | Admitting: Internal Medicine

## 2019-08-26 ENCOUNTER — Other Ambulatory Visit: Payer: Self-pay

## 2019-08-26 ENCOUNTER — Ambulatory Visit (INDEPENDENT_AMBULATORY_CARE_PROVIDER_SITE_OTHER): Payer: Medicare HMO | Admitting: Family Medicine

## 2019-08-26 ENCOUNTER — Encounter: Payer: Self-pay | Admitting: Family Medicine

## 2019-08-26 VITALS — BP 118/80 | HR 60 | Temp 97.4°F | Ht 68.25 in | Wt 165.6 lb

## 2019-08-26 DIAGNOSIS — R350 Frequency of micturition: Secondary | ICD-10-CM

## 2019-08-26 DIAGNOSIS — B20 Human immunodeficiency virus [HIV] disease: Secondary | ICD-10-CM

## 2019-08-26 LAB — POC URINALSYSI DIPSTICK (AUTOMATED)
Bilirubin, UA: NEGATIVE
Blood, UA: NEGATIVE
Glucose, UA: NEGATIVE
Ketones, UA: NEGATIVE
Leukocytes, UA: NEGATIVE
Nitrite, UA: NEGATIVE
Protein, UA: NEGATIVE
Spec Grav, UA: 1.02 (ref 1.010–1.025)
Urobilinogen, UA: 0.2 E.U./dL
pH, UA: 7 (ref 5.0–8.0)

## 2019-08-26 NOTE — Progress Notes (Signed)
Luis Reed DOB: 1960/08/20 Encounter date: 08/26/2019  This is a 59 y.o. male who presents with Chief Complaint  Patient presents with  . Urinary Frequency    x1 month    History of present illness:  Nodule on face didn't go away all the way, but did improve.   Notes urinating about every 10-15 minutes. Also going 4-5 times/night. No burning. NO leaking urine. No abd pain or back pain. No blood in urine no odor to the urine. No difficulty with emptying and no difficulty with stream strength. Feels like he is going a decent amount.   Good water drinking through day. Good caffeine intake.  No discharge, drainage.  No concerns for STDs or new sexual partners.  Started to give a urinary frequency after seeing out on TV regarding this issue.  No change in urinary frequency after completing augmentin for facial skin infection.     Allergies  Allergen Reactions  . Hydrocodone Itching and Other (See Comments)    fidgety   Current Meds  Medication Sig  . amLODipine (NORVASC) 5 MG tablet Take 5 mg by mouth daily.  Marland Kitchen amoxicillin-clavulanate (AUGMENTIN) 875-125 MG tablet Take 1 tablet by mouth 2 (two) times daily.  . bisacodyl (DULCOLAX) 5 MG EC tablet Take 5 mg by mouth once. X 4 tabs for colon prep 7-28  . calcitRIOL (ROCALTROL) 0.5 MCG capsule Take 0.5 mcg by mouth at bedtime.  . CELLCEPT 250 MG capsule Take 250 mg by mouth 2 (two) times daily.  . dolutegravir (TIVICAY) 50 MG tablet Take 1 tablet (50 mg total) by mouth daily.  Marland Kitchen emtricitabine-tenofovir AF (DESCOVY) 200-25 MG tablet Take 1 tablet by mouth at bedtime.  . predniSONE (DELTASONE) 5 MG tablet Take 5 mg by mouth every Monday, Wednesday, and Friday at 8 PM.   . tacrolimus (PROGRAF) 1 MG capsule Take 2 mg by mouth 2 (two) times daily.   . tacrolimus (PROGRAF) 5 MG capsule Take 5 mg by mouth 2 (two) times daily.   Marland Kitchen zolpidem (AMBIEN) 10 MG tablet Take 10 mg by mouth at bedtime as needed for sleep.    Current  Facility-Administered Medications for the 08/26/19 encounter (Office Visit) with Caren Macadam, MD  Medication  . betamethasone acetate-betamethasone sodium phosphate (CELESTONE) injection 3 mg    Review of Systems  Constitutional: Negative for chills, fatigue and fever.  Respiratory: Negative for cough, chest tightness, shortness of breath and wheezing.   Cardiovascular: Negative for chest pain, palpitations and leg swelling.  Genitourinary: Positive for frequency. Negative for decreased urine volume, difficulty urinating, discharge, dysuria, flank pain, genital sores and hematuria.    Objective:  BP 118/80 (BP Location: Right Arm, Patient Position: Sitting, Cuff Size: Normal)   Pulse 60   Temp (!) 97.4 F (36.3 C) (Temporal)   Ht 5' 8.25" (1.734 m)   Wt 165 lb 9.6 oz (75.1 kg)   BMI 25.00 kg/m   Weight: 165 lb 9.6 oz (75.1 kg)   BP Readings from Last 3 Encounters:  08/26/19 118/80  08/02/19 124/72  05/16/19 136/83   Wt Readings from Last 3 Encounters:  08/26/19 165 lb 9.6 oz (75.1 kg)  08/02/19 162 lb 9.6 oz (73.8 kg)  02/01/19 166 lb 4.8 oz (75.4 kg)    Physical Exam Constitutional:      General: He is not in acute distress.    Appearance: He is well-developed.  Cardiovascular:     Rate and Rhythm: Normal rate and regular rhythm.  Heart sounds: Normal heart sounds. No murmur. No friction rub.  Pulmonary:     Effort: Pulmonary effort is normal. No respiratory distress.     Breath sounds: Normal breath sounds. No wheezing or rales.  Genitourinary:    Penis: Normal.      Prostate: Enlarged (5). Not tender and no nodules present.  Musculoskeletal:     Right lower leg: No edema.     Left lower leg: No edema.  Skin:    Comments: There is still small nodule about 1cm left nasolabial fold. Nontender; significantly improved in size from previous.  Neurological:     Mental Status: He is alert and oriented to person, place, and time.  Psychiatric:         Behavior: Behavior normal.     Assessment/Plan  1. Urinary frequency UA looks normal today; will send for culture. Has no hx of UTIs; hasn't seen urology in past. Discussed possible f/u including urology evaluation. Advised stopping water intake after dinner to monitor night time urination. He does drink water through day and night; so before trying medications, I would suggest drink modifications.   If needing abx would use something with skin coverage to see if nodule on face resolves further.   - POCT Urinalysis Dipstick (Automated) - Urine Culture       Theodis Shove, MD

## 2019-08-27 LAB — URINE CULTURE
MICRO NUMBER:: 10379090
SPECIMEN QUALITY:: ADEQUATE

## 2019-09-02 MED FILL — TIVICAY 50 MG TABLET: 50 | 30 days supply | Qty: 30 | Fill #2

## 2019-09-02 MED FILL — DESCOVY 200-25 MG TABS: 200-25 | 30 days supply | Qty: 30 | Fill #0

## 2019-10-08 MED FILL — DESCOVY 200-25 MG TABS: 200-25 | 30 days supply | Qty: 30 | Fill #1

## 2019-10-08 MED FILL — TIVICAY 50 MG TABLET: 50 | 30 days supply | Qty: 30 | Fill #3

## 2019-10-21 DIAGNOSIS — N189 Chronic kidney disease, unspecified: Secondary | ICD-10-CM | POA: Diagnosis not present

## 2019-10-21 DIAGNOSIS — Z940 Kidney transplant status: Secondary | ICD-10-CM | POA: Diagnosis not present

## 2019-11-06 MED FILL — TIVICAY 50 MG TABLET: 50 | 30 days supply | Qty: 30 | Fill #4

## 2019-11-06 MED FILL — DESCOVY 200-25 MG TABS: 200-25 | 30 days supply | Qty: 30 | Fill #2

## 2019-11-18 DIAGNOSIS — N2581 Secondary hyperparathyroidism of renal origin: Secondary | ICD-10-CM | POA: Diagnosis not present

## 2019-11-18 DIAGNOSIS — I10 Essential (primary) hypertension: Secondary | ICD-10-CM | POA: Diagnosis not present

## 2019-11-18 DIAGNOSIS — B1920 Unspecified viral hepatitis C without hepatic coma: Secondary | ICD-10-CM | POA: Diagnosis not present

## 2019-11-18 DIAGNOSIS — T8611 Kidney transplant rejection: Secondary | ICD-10-CM | POA: Diagnosis not present

## 2019-11-18 DIAGNOSIS — R8279 Other abnormal findings on microbiological examination of urine: Secondary | ICD-10-CM | POA: Diagnosis not present

## 2019-11-18 DIAGNOSIS — Z9225 Personal history of immunosupression therapy: Secondary | ICD-10-CM | POA: Diagnosis not present

## 2019-11-18 DIAGNOSIS — Z21 Asymptomatic human immunodeficiency virus [HIV] infection status: Secondary | ICD-10-CM | POA: Diagnosis not present

## 2019-11-18 DIAGNOSIS — Z940 Kidney transplant status: Secondary | ICD-10-CM | POA: Diagnosis not present

## 2019-12-05 MED FILL — TIVICAY 50 MG TABLET: 50 | 30 days supply | Qty: 30 | Fill #5

## 2019-12-05 MED FILL — DESCOVY 200-25 MG TABS: 200-25 | 30 days supply | Qty: 30 | Fill #3

## 2019-12-18 DIAGNOSIS — Z940 Kidney transplant status: Secondary | ICD-10-CM | POA: Diagnosis not present

## 2019-12-18 DIAGNOSIS — Z6825 Body mass index (BMI) 25.0-25.9, adult: Secondary | ICD-10-CM | POA: Diagnosis not present

## 2019-12-18 DIAGNOSIS — I10 Essential (primary) hypertension: Secondary | ICD-10-CM | POA: Diagnosis not present

## 2019-12-24 ENCOUNTER — Telehealth: Payer: Self-pay | Admitting: Family Medicine

## 2019-12-24 NOTE — Telephone Encounter (Signed)
Pt is calling stating that the swelling in his face is back that he had a month ago and wanted to see if he could get some ABX.  Pt declined a appointment want to speak with Ronnald Collum pt is aware that she is in with a pt.  Pt stated that he would like to have a call back.

## 2019-12-25 NOTE — Telephone Encounter (Signed)
Is he referring to visit he had back in March?  I have not seen him since April.  I do feel, since this is on the face, that he needs to have evaluation before we send in antibiotics.  Would be okay to address this virtual visit with Dr. Selena Batten tomorrow if he is willing.

## 2019-12-25 NOTE — Telephone Encounter (Signed)
Unable to leave a message due to voicemail being full. 

## 2019-12-26 ENCOUNTER — Encounter: Payer: Self-pay | Admitting: Family Medicine

## 2019-12-26 ENCOUNTER — Telehealth (INDEPENDENT_AMBULATORY_CARE_PROVIDER_SITE_OTHER): Payer: Medicare HMO | Admitting: Family Medicine

## 2019-12-26 DIAGNOSIS — R220 Localized swelling, mass and lump, head: Secondary | ICD-10-CM

## 2019-12-26 MED ORDER — AMOXICILLIN-POT CLAVULANATE 875-125 MG PO TABS: 1.0000 | ORAL_TABLET | Freq: Two times a day (BID) | ORAL | 0 refills | Status: DC

## 2019-12-26 NOTE — Patient Instructions (Signed)
-  I sent the medication(s) we discussed to your pharmacy: Meds ordered this encounter  Medications  . amoxicillin-clavulanate (AUGMENTIN) 875-125 MG tablet    Sig: Take 1 tablet by mouth 2 (two) times daily.    Dispense:  14 tablet    Refill:  0    Call number provided at visit tomorrow morning for visit with the Ear, Nose and Throat specialist to evaluate.  Seek inperson care with your doctor or at the urgent care if worsening, new symptoms or not improving while waiting on appointment with your specialist.

## 2019-12-26 NOTE — Telephone Encounter (Signed)
Spoke with the pt and informed him of the message below.  Appt scheduled for today at 6pm with Dr Selena Batten.

## 2019-12-26 NOTE — Progress Notes (Signed)
Virtual Visit via Video Note  I connected with Luis Reed  on 12/26/19 at  6:00 PM EDT by a video enabled telemedicine application and verified that I am speaking with the correct person using two identifiers.  Location patient: home Location provider:work or home office Persons participating in the virtual visit: patient, provider  I discussed the limitations of evaluation and management by telemedicine and the availability of in person appointments. The patient expressed understanding and agreed to proceed.  HPI:  Acute visit for swollen area on face: -started 2 weeks -symptoms include swelling along L side of his nose -reports is not really painful or hot -denies fevers, malaise, tooth or mouth lesions or pain, difficulty chewing or swallowing -reports similar swelling developed in March and resolved mostly with augmentin - but reports thought he still could feel a small mass there which then swelled up again the last 2 weeks -he is worried about what it could be -PMH sig for HIV, sees ID for managment  ROS: See pertinent positives and negatives per HPI.  Past Medical History:  Diagnosis Date  . Blood transfusion without reported diagnosis   . Chronic insomnia 07/18/2014  . Hepatitis C   . HIV infection (HCC)   . Hypertension   . Malignant hyperthermia   . Renal disorder renal failure   sees Dr. Dierdre Highman   . SEIZURE DISORDER 03/15/2006   Qualifier: History of  By: Orvan Falconer MD, [name]  - pt states last seizure 2000    Past Surgical History:  Procedure Laterality Date  . AV FISTULA PLACEMENT    . COLONOSCOPY  2004  . LIGATION OF ARTERIOVENOUS  FISTULA Left 03/16/2018   Procedure: LIGATION OF ARTERIOVENOUS  FISTULA LEFT ARM;  Surgeon: Larina Earthly, MD;  Location: [facility] OR;  Service: Vascular;  Laterality: Left;  . NEPHRECTOMY TRANSPLANTED ORGAN      Family History  Problem Relation Age of Onset  . Prostate cancer Father 40  . Colon cancer Neg Hx   . Colon polyps Neg Hx    . Esophageal cancer Neg Hx   . Rectal cancer Neg Hx   . Stomach cancer Neg Hx     SOCIAL HX: see hpi   Current Outpatient Medications:  .  amLODipine (NORVASC) 5 MG tablet, Take 5 mg by mouth daily., Disp: , Rfl:  .  bisacodyl (DULCOLAX) 5 MG EC tablet, Take 5 mg by mouth once. X 4 tabs for colon prep 7-28, Disp: , Rfl:  .  calcitRIOL (ROCALTROL) 0.5 MCG capsule, Take 0.5 mcg by mouth at bedtime., Disp: , Rfl: 3 .  CELLCEPT 250 MG capsule, Take 250 mg by mouth 2 (two) times daily., Disp: , Rfl: 3 .  DESCOVY 200-25 MG tablet, TAKE 1 TABLET BY MOUTH AT BEDTIME., Disp: 30 tablet, Rfl: 5 .  dolutegravir (TIVICAY) 50 MG tablet, Take 1 tablet (50 mg total) by mouth daily., Disp: 30 tablet, Rfl: 5 .  predniSONE (DELTASONE) 5 MG tablet, Take 5 mg by mouth every Monday, Wednesday, and Friday at 8 PM. , Disp: , Rfl:  .  tacrolimus (PROGRAF) 1 MG capsule, Take 2 mg by mouth 2 (two) times daily. , Disp: , Rfl:  .  tacrolimus (PROGRAF) 5 MG capsule, Take 5 mg by mouth 2 (two) times daily. , Disp: , Rfl:  .  zolpidem (AMBIEN) 10 MG tablet, Take 10 mg by mouth at bedtime as needed for sleep. , Disp: , Rfl: 1 .  amoxicillin-clavulanate (AUGMENTIN) 875-125 MG tablet, Take  1 tablet by mouth 2 (two) times daily., Disp: 14 tablet, Rfl: 0  Current Facility-Administered Medications:  .  betamethasone acetate-betamethasone sodium phosphate (CELESTONE) injection 3 mg, 3 mg, Intramuscular, Once, Evans, Larena Glassman, DPM  EXAM:  VITALS per patient if applicable:  GENERAL: alert, oriented, appears well and in no acute distress  HEENT: atraumatic, conjunttiva clear, no obvious abnormalities on inspection of external nose and ears  NECK: normal movements of the head and neck  LUNGS: on inspection no signs of respiratory distress, breathing rate appears normal, no obvious gross SOB, gasping or wheezing  CV: no obvious cyanosis  SKIN: Can see area that appears raised compared to contralateral side around L  nasolabial crease - approz quarter size, ? Mildly erythematous  MS: moves all visible extremities without noticeable abnormality  PSYCH/NEURO: pleasant and cooperative, no obvious depression or anxiety, speech and thought processing grossly intact  ASSESSMENT AND PLAN:  Discussed the following assessment and plan:  Facial mass - Plan: Ambulatory referral to ENT  -we discussed possible serious and likely etiologies, options for evaluation and workup, limitations of telemedicine visit vs in person visit, treatment, treatment risks and precautions. Pt prefers to treat via telemedicine empirically rather then risking or undertaking an in person visit at this moment. Query cyst, infection vs other. Advised inperson evaluation with PCP or ENT - he opted for ENT eval. Provided him number to call to schedule appointment in the morning and also sent referral in case needed for insurance purposes. Sent rx for augmentin at his request as well as he felt this helped in the past to try in the interim. Advised to seek prompt follow up  in person care if worsening, new symptoms arise, or if is not improving with treatment while ENT/PCP appt pedning. Sent message to PCP office to ensure follow up there as well.    I discussed the assessment and treatment plan with the patient. The patient was provided an opportunity to ask questions and all were answered. The patient agreed with the plan and demonstrated an understanding of the instructions.   The patient was advised to call back or seek an in-person evaluation if the symptoms worsen or if the condition fails to improve as anticipated.   Terressa Koyanagi, DO

## 2019-12-30 ENCOUNTER — Other Ambulatory Visit: Payer: Self-pay | Admitting: Internal Medicine

## 2019-12-30 DIAGNOSIS — B20 Human immunodeficiency virus [HIV] disease: Secondary | ICD-10-CM

## 2019-12-30 MED ORDER — DESCOVY 200-25 MG PO TABS: 1.0000 | ORAL_TABLET | Freq: Every day | ORAL | 0 refills | Status: DC

## 2020-01-02 MED FILL — DESCOVY 200-25 MG TABS: 200-25 | 30 days supply | Qty: 30 | Fill #4

## 2020-01-02 MED FILL — TIVICAY 50 MG TABLET: 50 | 30 days supply | Qty: 30 | Fill #0

## 2020-01-14 ENCOUNTER — Telehealth: Payer: Self-pay

## 2020-01-14 NOTE — Telephone Encounter (Signed)
Noted patient did not have follow up visit scheduled. Call transferred to scheduling for follow up and labs next month.  [name]

## 2020-01-28 ENCOUNTER — Other Ambulatory Visit: Payer: Self-pay | Admitting: Internal Medicine

## 2020-01-28 DIAGNOSIS — B20 Human immunodeficiency virus [HIV] disease: Secondary | ICD-10-CM

## 2020-01-29 ENCOUNTER — Other Ambulatory Visit: Payer: Self-pay

## 2020-01-29 ENCOUNTER — Other Ambulatory Visit: Payer: Medicare HMO

## 2020-01-29 DIAGNOSIS — B20 Human immunodeficiency virus [HIV] disease: Secondary | ICD-10-CM | POA: Diagnosis not present

## 2020-01-29 NOTE — Telephone Encounter (Signed)
Refills approved. No future refill until seen by provider on 02/21/20 [name]

## 2020-01-30 ENCOUNTER — Other Ambulatory Visit: Payer: Self-pay | Admitting: *Deleted

## 2020-01-30 DIAGNOSIS — B20 Human immunodeficiency virus [HIV] disease: Secondary | ICD-10-CM

## 2020-01-30 LAB — T-HELPER CELL (CD4) - (RCID CLINIC ONLY)
CD4 % Helper T Cell: 33 % (ref 33–65)
CD4 T Cell Abs: 692 /uL (ref 400–1790)

## 2020-01-30 MED ORDER — DESCOVY 200-25 MG PO TABS: 1.0000 | ORAL_TABLET | Freq: Every day | ORAL | 0 refills | Status: DC

## 2020-01-30 MED ORDER — TIVICAY 50 MG PO TABS: 50.0000 mg | ORAL_TABLET | Freq: Every day | ORAL | 0 refills | Status: DC

## 2020-01-30 MED FILL — TIVICAY 50 MG TABLET: 50 | 30 days supply | Qty: 30 | Fill #0

## 2020-01-30 MED FILL — DESCOVY 200-25 MG TABS: 200-25 | 30 days supply | Qty: 30 | Fill #5

## 2020-02-02 LAB — LIPID PANEL
Cholesterol: 137 mg/dL (ref <200)
HDL: 66 mg/dL (ref >=40)
LDL Cholesterol (Calc): 58 mg/dL (calc)
Non-HDL Cholesterol (Calc): 71 mg/dL (calc) (ref <130)
Total CHOL/HDL Ratio: 2.1 (calc) (ref <5.0)
Triglycerides: 51 mg/dL (ref <150)

## 2020-02-02 LAB — COMPREHENSIVE METABOLIC PANEL
AG Ratio: 1.4 (calc) (ref 1.0–2.5)
ALT: 9 U/L (ref 9–46)
AST: 12 U/L (ref 10–35)
Albumin: 4.1 g/dL (ref 3.6–5.1)
Alkaline phosphatase (APISO): 61 U/L (ref 35–144)
BUN/Creatinine Ratio: 11 (calc) (ref 6–22)
BUN: 20 mg/dL (ref 7–25)
CO2: 26 mmol/L (ref 20–32)
Calcium: 7.5 mg/dL — ABNORMAL LOW (ref 8.6–10.3)
Chloride: 104 mmol/L (ref 98–110)
Creat: 1.79 mg/dL — ABNORMAL HIGH (ref 0.70–1.25)
Globulin: 3 g/dL (calc) (ref 1.9–3.7)
Glucose, Bld: 121 mg/dL — ABNORMAL HIGH (ref 65–99)
Potassium: 4 mmol/L (ref 3.5–5.3)
Sodium: 139 mmol/L (ref 135–146)
Total Bilirubin: 0.6 mg/dL (ref 0.2–1.2)
Total Protein: 7.1 g/dL (ref 6.1–8.1)

## 2020-02-02 LAB — CBC
HCT: 44.4 % (ref 38.5–50.0)
Hemoglobin: 14.9 g/dL (ref 13.2–17.1)
MCH: 33.3 pg — ABNORMAL HIGH (ref 27.0–33.0)
MCHC: 33.6 g/dL (ref 32.0–36.0)
MCV: 99.1 fL (ref 80.0–100.0)
MPV: 9.4 fL (ref 7.5–12.5)
Platelets: 207 10*3/uL (ref 140–400)
RBC: 4.48 10*6/uL (ref 4.20–5.80)
RDW: 12.6 % (ref 11.0–15.0)
WBC: 5.8 10*3/uL (ref 3.8–10.8)

## 2020-02-02 LAB — HIV-1 RNA QUANT-NO REFLEX-BLD
HIV 1 RNA Quant: <20 Copies/mL
HIV-1 RNA Quant, Log: <1.3 Log cps/mL

## 2020-02-02 LAB — RPR: RPR Ser Ql: NONREACTIVE

## 2020-02-06 DIAGNOSIS — M2740 Unspecified cyst of jaw: Secondary | ICD-10-CM | POA: Diagnosis not present

## 2020-02-19 ENCOUNTER — Ambulatory Visit (INDEPENDENT_AMBULATORY_CARE_PROVIDER_SITE_OTHER): Payer: Medicare HMO

## 2020-02-19 ENCOUNTER — Other Ambulatory Visit: Payer: Self-pay

## 2020-02-19 DIAGNOSIS — Z0000 Encounter for general adult medical examination without abnormal findings: Secondary | ICD-10-CM

## 2020-02-19 NOTE — Patient Instructions (Addendum)
Luis Reed , Thank you for taking time to come for your Medicare Wellness Visit. I appreciate your ongoing commitment to your health goals. Please review the following plan we discussed and let me know if I can assist you in the future.   Screening recommendations/referrals: Colonoscopy: Done 08/12/19 Recommended yearly ophthalmology/optometry visit for glaucoma screening and checkup Recommended yearly dental visit for hygiene and checkup  Vaccinations: Influenza vaccine: Pt stated he will get 10/14 at Dr appt  Pneumococcal vaccine: Done 02/10/11 Tdap vaccine: Pt stated 11/06/12 Shingles vaccine: Shingrix discussed. Please contact your pharmacy for coverage information.    Covid-19: Completed 07/08/19  Advanced directives: Advance directive discussed with you today. Even though you declined this today please call our office should you change your mind and we can give you the proper paperwork for you to fill out.  Conditions/risks identified: Stay healthy  Next appointment: Follow up in one year for your annual wellness visit   Preventive Care 40-64 Years, Male Preventive care refers to lifestyle choices and visits with your health care provider that can promote health and wellness. What does preventive care include?  A yearly physical exam. This is also called an annual well check.  Dental exams once or twice a year.  Routine eye exams. Ask your health care provider how often you should have your eyes checked.  Personal lifestyle choices, including:  Daily care of your teeth and gums.  Regular physical activity.  Eating a healthy diet.  Avoiding tobacco and drug use.  Limiting alcohol use.  Practicing safe sex.  Taking low-dose aspirin every day starting at age 95. What happens during an annual well check? The services and screenings done by your health care provider during your annual well check will depend on your age, overall health, lifestyle risk factors, and family  history of disease. Counseling  Your health care provider may ask you questions about your:  Alcohol use.  Tobacco use.  Drug use.  Emotional well-being.  Home and relationship well-being.  Sexual activity.  Eating habits.  Work and work Astronomer. Screening  You may have the following tests or measurements:  Height, weight, and BMI.  Blood pressure.  Lipid and cholesterol levels. These may be checked every 5 years, or more frequently if you are over 89 years old.  Skin check.  Lung cancer screening. You may have this screening every year starting at age 43 if you have a 30-pack-year history of smoking and currently smoke or have quit within the past 15 years.  Fecal occult blood test (FOBT) of the stool. You may have this test every year starting at age 75.  Flexible sigmoidoscopy or colonoscopy. You may have a sigmoidoscopy every 5 years or a colonoscopy every 10 years starting at age 71.  Prostate cancer screening. Recommendations will vary depending on your family history and other risks.  Hepatitis C blood test.  Hepatitis B blood test.  Sexually transmitted disease (STD) testing.  Diabetes screening. This is done by checking your blood sugar (glucose) after you have not eaten for a while (fasting). You may have this done every 1-3 years. Discuss your test results, treatment options, and if necessary, the need for more tests with your health care provider. Vaccines  Your health care provider may recommend certain vaccines, such as:  Influenza vaccine. This is recommended every year.  Tetanus, diphtheria, and acellular pertussis (Tdap, Td) vaccine. You may need a Td booster every 10 years.  Zoster vaccine. You may need this after  age 52.  Pneumococcal 13-valent conjugate (PCV13) vaccine. You may need this if you have certain conditions and have not been vaccinated.  Pneumococcal polysaccharide (PPSV23) vaccine. You may need one or two doses if you smoke  cigarettes or if you have certain conditions. Talk to your health care provider about which screenings and vaccines you need and how often you need them. This information is not intended to replace advice given to you by your health care provider. Make sure you discuss any questions you have with your health care provider. Document Released: 05/22/2015 Document Revised: 01/13/2016 Document Reviewed: 02/24/2015 Elsevier Interactive Patient Education  2017 [employer].  Fall Prevention in the Home Falls can cause injuries. They can happen to people of all ages. There are many things you can do to make your home safe and to help prevent falls. What can I do on the outside of my home?  Regularly fix the edges of walkways and driveways and fix any cracks.  Remove anything that might make you trip as you walk through a door, such as a raised step or threshold.  Trim any bushes or trees on the path to your home.  Use bright outdoor lighting.  Clear any walking paths of anything that might make someone trip, such as rocks or tools.  Regularly check to see if handrails are loose or broken. Make sure that both sides of any steps have handrails.  Any raised decks and porches should have guardrails on the edges.  Have any leaves, snow, or ice cleared regularly.  Use sand or salt on walking paths during winter.  Clean up any spills in your garage right away. This includes oil or grease spills. What can I do in the bathroom?  Use night lights.  Install grab bars by the toilet and in the tub and shower. Do not use towel bars as grab bars.  Use non-skid mats or decals in the tub or shower.  If you need to sit down in the shower, use a plastic, non-slip stool.  Keep the floor dry. Clean up any water that spills on the floor as soon as it happens.  Remove soap buildup in the tub or shower regularly.  Attach bath mats securely with double-sided non-slip rug tape.  Do not have throw rugs  and other things on the floor that can make you trip. What can I do in the bedroom?  Use night lights.  Make sure that you have a light by your bed that is easy to reach.  Do not use any sheets or blankets that are too big for your bed. They should not hang down onto the floor.  Have a firm chair that has side arms. You can use this for support while you get dressed.  Do not have throw rugs and other things on the floor that can make you trip. What can I do in the kitchen?  Clean up any spills right away.  Avoid walking on wet floors.  Keep items that you use a lot in easy-to-reach places.  If you need to reach something above you, use a strong step stool that has a grab bar.  Keep electrical cords out of the way.  Do not use floor polish or wax that makes floors slippery. If you must use wax, use non-skid floor wax.  Do not have throw rugs and other things on the floor that can make you trip. What can I do with my stairs?  Do not leave any  items on the stairs.  Make sure that there are handrails on both sides of the stairs and use them. Fix handrails that are broken or loose. Make sure that handrails are as long as the stairways.  Check any carpeting to make sure that it is firmly attached to the stairs. Fix any carpet that is loose or worn.  Avoid having throw rugs at the top or bottom of the stairs. If you do have throw rugs, attach them to the floor with carpet tape.  Make sure that you have a light switch at the top of the stairs and the bottom of the stairs. If you do not have them, ask someone to add them for you. What else can I do to help prevent falls?  Wear shoes that:  Do not have high heels.  Have rubber bottoms.  Are comfortable and fit you well.  Are closed at the toe. Do not wear sandals.  If you use a stepladder:  Make sure that it is fully opened. Do not climb a closed stepladder.  Make sure that both sides of the stepladder are locked into  place.  Ask someone to hold it for you, if possible.  Clearly mark and make sure that you can see:  Any grab bars or handrails.  First and last steps.  Where the edge of each step is.  Use tools that help you move around (mobility aids) if they are needed. These include:  Canes.  Walkers.  Scooters.  Crutches.  Turn on the lights when you go into a dark area. Replace any light bulbs as soon as they burn out.  Set up your furniture so you have a clear path. Avoid moving your furniture around.  If any of your floors are uneven, fix them.  If there are any pets around you, be aware of where they are.  Review your medicines with your doctor. Some medicines can make you feel dizzy. This can increase your chance of falling. Ask your doctor what other things that you can do to help prevent falls. This information is not intended to replace advice given to you by your health care provider. Make sure you discuss any questions you have with your health care provider. Document Released: 02/19/2009 Document Revised: 10/01/2015 Document Reviewed: 05/30/2014 Elsevier Interactive Patient Education  2017 [employer].

## 2020-02-19 NOTE — Progress Notes (Signed)
Virtual Visit via Telephone Note  I connected with  Luis Reed on 02/19/20 at 10:15 AM EDT by telephone and verified that I am speaking with the correct person using two identifiers.  Medicare Annual Wellness visit completed telephonically due to Covid-19 pandemic.   Persons participating in this call: This Health Coach and this patient.   Location: Patient: Home Provider: Office   I discussed the limitations, risks, security and privacy concerns of performing an evaluation and management service by telephone and the availability of in person appointments. The patient expressed understanding and agreed to proceed.  Unable to perform video visit due to video visit attempted and failed and/or patient does not have video capability.   Some vital signs may be absent or patient reported.   Marzella Schlein, LPN    Subjective:   Luis Reed is a 59 y.o. male who presents for Medicare Annual/Subsequent preventive examination.  Review of Systems     Cardiac Risk Factors include: hypertension;male gender     Objective:    There were no vitals filed for this visit. There is no height or weight on file to calculate BMI.  Advanced Directives 02/19/2020 05/16/2019 03/16/2018 04/01/2017 09/26/2015 09/09/2014 07/06/2014  Does Patient Have a Medical Advance Directive? No No No No No No No  Would patient like information on creating a medical advance directive? No - Patient declined No - Patient declined No - Patient declined No - Patient declined No - patient declined information - No - patient declined information    Current Medications (verified) Outpatient Encounter Medications as of 02/19/2020  Medication Sig  . amLODipine (NORVASC) 5 MG tablet Take 5 mg by mouth daily.  . calcitRIOL (ROCALTROL) 0.5 MCG capsule Take 0.5 mcg by mouth at bedtime.  . CELLCEPT 250 MG capsule Take 250 mg by mouth 2 (two) times daily.  . dolutegravir (TIVICAY) 50 MG tablet Take 1 tablet (50 mg total)  by mouth daily.  Marland Kitchen emtricitabine-tenofovir AF (DESCOVY) 200-25 MG tablet Take 1 tablet by mouth at bedtime.  . predniSONE (DELTASONE) 5 MG tablet Take 5 mg by mouth every Monday, Wednesday, and Friday at 8 PM.   . tacrolimus (PROGRAF) 1 MG capsule Take 2 mg by mouth 2 (two) times daily.   . tacrolimus (PROGRAF) 5 MG capsule Take 5 mg by mouth 2 (two) times daily.   Marland Kitchen zolpidem (AMBIEN) 10 MG tablet Take 10 mg by mouth at bedtime as needed for sleep.   Marland Kitchen amoxicillin-clavulanate (AUGMENTIN) 875-125 MG tablet Take 1 tablet by mouth 2 (two) times daily.  . bisacodyl (DULCOLAX) 5 MG EC tablet Take 5 mg by mouth once. X 4 tabs for colon prep 7-28 (Patient not taking: Reported on 02/19/2020)   Facility-Administered Encounter Medications as of 02/19/2020  Medication  . betamethasone acetate-betamethasone sodium phosphate (CELESTONE) injection 3 mg    Allergies (verified) Hydrocodone   History: Past Medical History:  Diagnosis Date  . Blood transfusion without reported diagnosis   . Chronic insomnia 07/18/2014  . Hepatitis C   . HIV infection (HCC)   . Hypertension   . Malignant hyperthermia   . Renal disorder renal failure   sees Dr. Dierdre Highman   . SEIZURE DISORDER 03/15/2006   Qualifier: History of  By: Orvan Falconer MD, [name]  - pt states last seizure 2000   Past Surgical History:  Procedure Laterality Date  . AV FISTULA PLACEMENT    . COLONOSCOPY  2004  . LIGATION OF ARTERIOVENOUS  FISTULA Left  03/16/2018   Procedure: LIGATION OF ARTERIOVENOUS  FISTULA LEFT ARM;  Surgeon: Larina EarthlyEarly, Todd F, MD;  Location: [facility] OR;  Service: Vascular;  Laterality: Left;  . NEPHRECTOMY TRANSPLANTED ORGAN     Family History  Problem Relation Age of Onset  . Prostate cancer Father 8370  . Colon cancer Neg Hx   . Colon polyps Neg Hx   . Esophageal cancer Neg Hx   . Rectal cancer Neg Hx   . Stomach cancer Neg Hx    Social History   Socioeconomic History  . Marital status: Married    Spouse name: Steward DroneBrenda    . Number of children: 2  . Years of education: 9312  . Highest education level: Not on file  Occupational History  . Occupation: Disabled    Comment: Kidney disease   Tobacco Use  . Smoking status: Former Smoker    Packs/day: 0.30    Years: 20.00    Pack years: 6.00    Types: Cigarettes  . Smokeless tobacco: Never Used  . Tobacco comment: quit smoking cigaettes at age 59  Vaping Use  . Vaping Use: Never used  Substance and Sexual Activity  . Alcohol use: No  . Drug use: No  . Sexual activity: Not Currently    Partners: Female    Comment: declined condoms  Other Topics Concern  . Not on file  Social History Narrative   [place] an house with his wife. No pets. Fun: fishing.    Denies religious beliefs that would effect health care.    Caffeine none.   Social Determinants of Health   Financial Resource Strain: Low Risk   . Difficulty of Paying Living Expenses: Not hard at all  Food Insecurity: No Food Insecurity  . Worried About Programme researcher, broadcasting/film/videounning Out of Food in the Last Year: Never true  . Ran Out of Food in the Last Year: Never true  Transportation Needs: No Transportation Needs  . Lack of Transportation (Medical): No  . Lack of Transportation (Non-Medical): No  Physical Activity: Sufficiently Active  . Days of Exercise per Week: 4 days  . Minutes of Exercise per Session: 60 min  Stress: No Stress Concern Present  . Feeling of Stress : Not at all  Social Connections: Moderately Integrated  . Frequency of Communication with Friends and Family: Three times a week  . Frequency of Social Gatherings with Friends and Family: Three times a week  . Attends Religious Services: 1 to 4 times per year  . Active Member of Clubs or Organizations: No  . Attends BankerClub or Organization Meetings: Never  . Marital Status: Married    Tobacco Counseling Counseling given: Not Answered Comment: quit smoking cigaettes at age 59   Clinical  Intake:  Pre-visit preparation completed: Yes  Pain : No/denies pain     BMI - recorded: 25 Nutritional Status: BMI 25 -29 Overweight Nutritional Risks: None Diabetes: No  How often do you need to have someone help you when you read instructions, pamphlets, or other written materials from your doctor or pharmacy?: 1 - Never  Diabetic?No  Interpreter Needed?: No  Information entered by :: Lanier Ensignina Edmundo Tedesco ,LPN   Activities of Daily Living In your present state of health, do you have any difficulty performing the following activities: 02/19/2020  Vision? N  Difficulty concentrating or making decisions? N  Walking or climbing stairs? N  Dressing or bathing? N  Doing errands, shopping? N  Quarry managerreparing Food and  eating ? N  Using the Toilet? N  In the past six months, have you accidently leaked urine? N  Do you have problems with loss of bowel control? N  Managing your Medications? N  Managing your Finances? N  Housekeeping or managing your Housekeeping? N  Some recent data might be hidden    Patient Care Team: Wynn Banker, MD as PCP - General (Family Medicine) Cliffton Asters, MD as Consulting Physician (Infectious Diseases) Terrial Rhodes, MD as Consulting Physician (Nephrology)  Indicate any recent Medical Services you may have received from other than [facility] providers in the past year (date may be approximate).     Assessment:   This is a routine wellness examination for Luis Reed.  Hearing/Vision screen  Hearing Screening   125Hz  250Hz  500Hz  1000Hz  2000Hz  3000Hz  4000Hz  6000Hz  8000Hz   Right ear:           Left ear:           Comments: Pt denies any hearing difficulty  Vision Screening Comments: Pt stated he switched Dr for eye exams haven't been in a year  Dietary issues and exercise activities discussed: Current Exercise Habits: Home exercise routine, Type of exercise: walking, Time (Minutes): 60, Frequency (Times/Week): 4, Weekly Exercise (Minutes/Week):  240  Goals    . Patient Stated     Stay healthy       Depression Screen PHQ 2/9 Scores 02/19/2020 02/12/2019 02/01/2019 11/21/2017 12/01/2016 10/08/2015 03/23/2015  PHQ - 2 Score 0 0 0 0 0 0 0    Fall Risk Fall Risk  02/19/2020 02/12/2019 11/21/2017 12/01/2016 10/08/2015  Falls in the past year? 0 0 No No No  Number falls in past yr: 0 0 - - -  Injury with Fall? 0 0 - - -  Risk for fall due to : Impaired vision - - - -  Follow up Falls prevention discussed - - - -    Any stairs in or around the home? Yes  If so, are there any without handrails? No  Home free of loose throw rugs in walkways, pet beds, electrical cords, etc? Yes  Adequate lighting in your home to reduce risk of falls? Yes   ASSISTIVE DEVICES UTILIZED TO PREVENT FALLS:  Life alert? No  Use of a cane, walker or w/c? No  Grab bars in the bathroom? No  Shower chair or bench in shower? No  Elevated toilet seat or a handicapped toilet? No   TIMED UP AND GO:  Was the test performed? No .     Cognitive Function:     6CIT Screen 02/19/2020  What Year? 0 points  What month? 0 points  Count back from 20 0 points  Months in reverse 4 points  Repeat phrase 10 points    Immunizations Immunization History  Administered Date(s) Administered  . Influenza Split 02/10/2011, 01/08/2012  . Influenza Whole 02/06/2005, 03/14/2006, 02/09/2009, 02/09/2010  . Influenza,inj,Quad PF,6+ Mos 01/17/2013, 01/16/2014, 04/23/2015  . Influenza-Unspecified 03/10/2019  . Janssen (J&J) SARS-COV-2 Vaccination 07/08/2019  . Pneumococcal Polysaccharide-23 10/18/2005, 02/10/2011    TDAP status: Up to date Flu Vaccine status: Declined, Education has been provided regarding the importance of this vaccine but patient still declined. Advised may receive this vaccine at local pharmacy or Health Dept. Aware to provide a copy of the vaccination record if obtained from local pharmacy or Health Dept. Verbalized acceptance and  understanding. Pneumococcal vaccine status: Up to date Covid-19 vaccine status: Completed vaccines  Qualifies for Shingles Vaccine? Yes  Zostavax completed No   Shingrix Completed?: No.    Education has been provided regarding the importance of this vaccine. Patient has been advised to call insurance company to determine out of pocket expense if they have not yet received this vaccine. Advised may also receive vaccine at local pharmacy or Health Dept. Verbalized acceptance and understanding.  Screening Tests Health Maintenance  Topic Date Due  . INFLUENZA VACCINE  12/08/2019  . Fecal DNA (Cologuard)  08/12/2022  . TETANUS/TDAP  11/07/2022  . COVID-19 Vaccine  Completed  . Hepatitis C Screening  Completed  . HIV Screening  Completed    Health Maintenance  Health Maintenance Due  Topic Date Due  . INFLUENZA VACCINE  12/08/2019    Colorectal cancer screening: Completed 08/12/19. Repeat every 3 years    Additional Screening:  Hepatitis C Screening: Completed 12/01/16  Vision Screening: Recommended annual ophthalmology exams for early detection of glaucoma and other disorders of the eye. Is the patient up to date with their annual eye exam?  No  Who is the provider or what is the name of the office in which the patient attends annual eye exams? Will have to switch Dr from the last appt he had   Dental Screening: Recommended annual dental exams for proper oral hygiene  Community Resource Referral / Chronic Care Management: CRR required this visit?  No   CCM required this visit?  No      Plan:     I have personally reviewed and noted the following in the patient's chart:   . Medical and social history . Use of alcohol, tobacco or illicit drugs  . Current medications and supplements . Functional ability and status . Nutritional status . Physical activity . Advanced directives . List of other physicians . Hospitalizations, surgeries, and ER visits in previous 12  months . Vitals . Screenings to include cognitive, depression, and falls . Referrals and appointments  In addition, I have reviewed and discussed with patient certain preventive protocols, quality metrics, and best practice recommendations. A written personalized care plan for preventive services as well as general preventive health recommendations were provided to patient.     Marzella Schlein, LPN   [id number]   Nurse Notes: None

## 2020-02-20 ENCOUNTER — Other Ambulatory Visit: Payer: Self-pay | Admitting: Internal Medicine

## 2020-02-20 ENCOUNTER — Ambulatory Visit (INDEPENDENT_AMBULATORY_CARE_PROVIDER_SITE_OTHER): Payer: Medicare HMO | Admitting: Internal Medicine

## 2020-02-20 DIAGNOSIS — B20 Human immunodeficiency virus [HIV] disease: Secondary | ICD-10-CM

## 2020-02-20 DIAGNOSIS — Z23 Encounter for immunization: Secondary | ICD-10-CM | POA: Diagnosis not present

## 2020-02-20 MED ORDER — TIVICAY 50 MG PO TABS: 50.0000 mg | ORAL_TABLET | Freq: Every day | ORAL | 11 refills | Status: DC

## 2020-02-20 MED ORDER — DESCOVY 200-25 MG PO TABS: 1.0000 | ORAL_TABLET | Freq: Every day | ORAL | 11 refills | Status: DC

## 2020-02-20 NOTE — Assessment & Plan Note (Signed)
His infection remains under excellent, long-term control.  He received his influenza vaccine here today.  He will continue Descovy and Tivicay and follow-up after lab work in 1 year.

## 2020-02-20 NOTE — Progress Notes (Signed)
Patient Active Problem List   Diagnosis Date Noted  . Human immunodeficiency virus (HIV) disease (HCC) 05/10/1999    Priority: High  . Hepatitis C virus infection without hepatic coma 03/15/2006    Priority: Medium  . COVID-19 virus infection 05/20/2019  . Malignant hyperthermia   . Other specified anxiety disorders 07/18/2014  . RA (retrograde amnesia) 07/18/2014  . TBI (traumatic brain injury) (HCC) 07/18/2014  . Post concussive syndrome 05/13/2014  . 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 Essential (primary) hypertension 06/06/2011  . KIDNEY TRANSPLANTATION 07/11/2007  . SYPHILIS 03/15/2006  . HYDROCELE NOS 03/15/2006  . ALCOHOL ABUSE, HX OF 03/15/2006  . HEPATITIS B, HX OF 03/15/2006  . TOBACCO USE, QUIT 03/15/2006  . PARATHYROIDECTOMY 08/08/2002  . CEREBROVASCULAR ACCIDENT, ACUTE 10/08/1999    Patient's Medications  New Prescriptions   No medications on file  Previous Medications   AMLODIPINE (NORVASC) 5 MG TABLET    Take 5 mg by mouth daily.   AMOXICILLIN-CLAVULANATE (AUGMENTIN) 875-125 MG TABLET    Take 1 tablet by mouth 2 (two) times daily.   BISACODYL (DULCOLAX) 5 MG EC TABLET    Take 5 mg by mouth once. X 4 tabs for colon prep 7-28   CALCITRIOL (ROCALTROL) 0.5 MCG CAPSULE    Take 0.5 mcg by mouth at bedtime.   CELLCEPT 250 MG CAPSULE    Take 250 mg by mouth 2 (two) times daily.   PREDNISONE (DELTASONE) 5 MG TABLET    Take 5 mg by mouth every Monday, Wednesday, and Friday at 8 PM.    TACROLIMUS (PROGRAF) 1 MG CAPSULE    Take 2 mg by mouth 2 (two) times daily.    TACROLIMUS (PROGRAF) 5 MG CAPSULE    Take 5 mg by mouth 2 (two) times daily.    ZOLPIDEM (AMBIEN) 10 MG TABLET    Take 10 mg by mouth at bedtime as needed for sleep.   Modified Medications   Modified Medication Previous Medication   DOLUTEGRAVIR (TIVICAY) 50 MG TABLET dolutegravir (TIVICAY) 50 MG tablet      Take 1 tablet (50 mg total) by mouth daily.    Take 1 tablet (50 mg total) by  mouth daily.   EMTRICITABINE-TENOFOVIR AF (DESCOVY) 200-25 MG TABLET emtricitabine-tenofovir AF (DESCOVY) 200-25 MG tablet      Take 1 tablet by mouth at bedtime.    Take 1 tablet by mouth at bedtime.  Discontinued Medications   No medications on file    Subjective: Luis Reed is in for his routine HIV follow-up visit.  He has not had any problems obtaining, taking or tolerating his Descovy or Tivicay and never misses doses.  He received his [employer] Covid vaccine earlier this year.  He is feeling well.  Review of Systems: Review of Systems  Constitutional: Negative for fever.  Respiratory: Negative for cough and shortness of breath.   Cardiovascular: Negative for chest pain.  Psychiatric/Behavioral: Negative for depression. The patient is not nervous/anxious.     Past Medical History:  Diagnosis Date  . Blood transfusion without reported diagnosis   . Chronic insomnia 07/18/2014  . Hepatitis C   . HIV infection (HCC)   . Hypertension   . Malignant hyperthermia   . Renal disorder renal failure   sees Dr. Dierdre Highman   . SEIZURE DISORDER 03/15/2006   Qualifier: History of  By: Orvan Falconer MD, [name]  - pt states last seizure 2000  Social History   Tobacco Use  . Smoking status: Former Smoker    Packs/day: 0.30    Years: 20.00    Pack years: 6.00    Types: Cigarettes  . Smokeless tobacco: Never Used  . Tobacco comment: quit smoking cigaettes at age 13  Vaping Use  . Vaping Use: Never used  Substance Use Topics  . Alcohol use: No  . Drug use: No    Family History  Problem Relation Age of Onset  . Prostate cancer Father 20  . Colon cancer Neg Hx   . Colon polyps Neg Hx   . Esophageal cancer Neg Hx   . Rectal cancer Neg Hx   . Stomach cancer Neg Hx     Allergies  Allergen Reactions  . Hydrocodone Itching and Other (See Comments)    fidgety    Health Maintenance  Topic Date Due  . INFLUENZA VACCINE  12/08/2019  . Fecal DNA (Cologuard)  08/12/2022    . TETANUS/TDAP  11/07/2022  . COVID-19 Vaccine  Completed  . Hepatitis C Screening  Completed  . HIV Screening  Completed    Objective:  There were no vitals filed for this visit. There is no height or weight on file to calculate BMI.  Physical Exam Constitutional:      Comments: He is in good spirits as usual.  Cardiovascular:     Rate and Rhythm: Normal rate and regular rhythm.     Heart sounds: No murmur heard.   Pulmonary:     Effort: Pulmonary effort is normal.     Breath sounds: Normal breath sounds.  Psychiatric:        Mood and Affect: Mood normal.     Lab Results Lab Results  Component Value Date   WBC 5.8 01/29/2020   HGB 14.9 01/29/2020   HCT 44.4 01/29/2020   MCV 99.1 01/29/2020   PLT 207 01/29/2020    Lab Results  Component Value Date   CREATININE 1.79 (H) 01/29/2020   BUN 20 01/29/2020   NA 139 01/29/2020   K 4.0 01/29/2020   CL 104 01/29/2020   CO2 26 01/29/2020    Lab Results  Component Value Date   ALT 9 01/29/2020   AST 12 01/29/2020   GGT 38 07/14/2014   ALKPHOS 55 08/12/2017   BILITOT 0.6 01/29/2020    Lab Results  Component Value Date   CHOL 137 01/29/2020   HDL 66 01/29/2020   LDLCALC 58 01/29/2020   TRIG 51 01/29/2020   CHOLHDL 2.1 01/29/2020   Lab Results  Component Value Date   LABRPR NON-REACTIVE 01/29/2020   HIV 1 RNA Quant  Date Value  01/29/2020 <20 Copies/mL  01/31/2019 <20 NOT DETECTED copies/mL  11/06/2017 <20 NOT DETECTED copies/mL   CD4 T Cell Abs (/uL)  Date Value  01/29/2020 692  01/31/2019 804  11/06/2017 710     Problem List Items Addressed This Visit      High   Human immunodeficiency virus (HIV) disease (HCC)    His infection remains under excellent, long-term control.  He received his influenza vaccine here today.  He will continue Descovy and Tivicay and follow-up after lab work in 1 year.      Relevant Medications   emtricitabine-tenofovir AF (DESCOVY) 200-25 MG tablet   dolutegravir  (TIVICAY) 50 MG tablet   Other Relevant Orders   CBC   T-helper cell (CD4)- ([facility] clinic only)   Comprehensive metabolic panel   Lipid panel   RPR  HIV-1 RNA quant-no reflex-bld        Cliffton Asters, MD [facility] for Infectious Disease [facility] [phone] pager   [phone] cell 02/20/2020, 11:22 AM

## 2020-02-24 MED FILL — DESCOVY 200-25 MG TABS: 200-25 | 30 days supply | Qty: 30 | Fill #0

## 2020-02-24 MED FILL — TIVICAY 50 MG TABLET: 50 | 30 days supply | Qty: 30 | Fill #0

## 2020-03-26 MED FILL — TIVICAY 50 MG TABLET: 50 | 30 days supply | Qty: 30 | Fill #1

## 2020-03-26 MED FILL — DESCOVY 200-25 MG TABS: 200-25 | 30 days supply | Qty: 30 | Fill #1

## 2020-04-20 MED FILL — DESCOVY 200-25 MG TABS: 200-25 | 30 days supply | Qty: 30 | Fill #2

## 2020-04-20 MED FILL — TIVICAY 50 MG TABLET: 50 | 30 days supply | Qty: 30 | Fill #2

## 2020-06-02 ENCOUNTER — Telehealth: Payer: Self-pay

## 2020-06-02 MED FILL — TIVICAY 50 MG TABLET: 50 | 30 days supply | Qty: 30 | Fill #3

## 2020-06-02 MED FILL — DESCOVY 200-25 MG TABS: 200-25 | 30 days supply | Qty: 30 | Fill #3

## 2020-06-02 NOTE — Telephone Encounter (Signed)
[facility] Patient Advocate Encounter   I was successful in securing patient a $7500 grant from [employer] ([employer]) to provide copayment coverage for Tivicay & Descovy.  This will make the out of pocket cost $0.00.     I have spoken with the patient.    The billing information is as follows and has been shared with [facility].          Patient knows to call the office with questions or concerns.  Clearance Coots, CPhT Specialty Pharmacy Patient [facility] for Infectious Disease Phone: [phone] Fax:  [phone]

## 2020-06-05 DIAGNOSIS — H524 Presbyopia: Secondary | ICD-10-CM | POA: Diagnosis not present

## 2020-06-12 DIAGNOSIS — Z21 Asymptomatic human immunodeficiency virus [HIV] infection status: Secondary | ICD-10-CM | POA: Diagnosis not present

## 2020-06-12 DIAGNOSIS — N2581 Secondary hyperparathyroidism of renal origin: Secondary | ICD-10-CM | POA: Diagnosis not present

## 2020-06-12 DIAGNOSIS — Z940 Kidney transplant status: Secondary | ICD-10-CM | POA: Diagnosis not present

## 2020-07-01 DIAGNOSIS — H40013 Open angle with borderline findings, low risk, bilateral: Secondary | ICD-10-CM | POA: Diagnosis not present

## 2020-07-02 MED FILL — DESCOVY 200-25 MG TABS: 200-25 | 30 days supply | Qty: 30 | Fill #4

## 2020-07-02 MED FILL — TIVICAY 50 MG TABLET: 50 | 30 days supply | Qty: 30 | Fill #4

## 2020-07-13 DIAGNOSIS — T8611 Kidney transplant rejection: Secondary | ICD-10-CM | POA: Diagnosis not present

## 2020-07-13 DIAGNOSIS — B20 Human immunodeficiency virus [HIV] disease: Secondary | ICD-10-CM | POA: Diagnosis not present

## 2020-07-13 DIAGNOSIS — Z940 Kidney transplant status: Secondary | ICD-10-CM | POA: Diagnosis not present

## 2020-07-13 DIAGNOSIS — N2581 Secondary hyperparathyroidism of renal origin: Secondary | ICD-10-CM | POA: Diagnosis not present

## 2020-07-13 DIAGNOSIS — I10 Essential (primary) hypertension: Secondary | ICD-10-CM | POA: Diagnosis not present

## 2020-07-13 DIAGNOSIS — Z9225 Personal history of immunosupression therapy: Secondary | ICD-10-CM | POA: Diagnosis not present

## 2020-07-13 DIAGNOSIS — R8279 Other abnormal findings on microbiological examination of urine: Secondary | ICD-10-CM | POA: Diagnosis not present

## 2020-07-13 DIAGNOSIS — N189 Chronic kidney disease, unspecified: Secondary | ICD-10-CM | POA: Diagnosis not present

## 2020-07-13 DIAGNOSIS — B1920 Unspecified viral hepatitis C without hepatic coma: Secondary | ICD-10-CM | POA: Diagnosis not present

## 2020-07-30 DIAGNOSIS — Z940 Kidney transplant status: Secondary | ICD-10-CM | POA: Diagnosis not present

## 2020-07-30 MED FILL — DESCOVY 200-25 MG TABS: 200-25 | 30 days supply | Qty: 30 | Fill #5

## 2020-07-30 MED FILL — TIVICAY 50 MG TABLET: 50 | 30 days supply | Qty: 30 | Fill #5

## 2020-07-31 ENCOUNTER — Other Ambulatory Visit (HOSPITAL_COMMUNITY): Payer: Self-pay

## 2020-08-19 ENCOUNTER — Other Ambulatory Visit (HOSPITAL_COMMUNITY): Payer: Self-pay

## 2020-08-19 MED FILL — Dolutegravir Sodium Tab 50 MG (Base Equiv): ORAL | 30 days supply | Qty: 30 | Fill #0 | Status: AC

## 2020-08-19 MED FILL — Emtricitabine-Tenofovir Alafenamide Fumarate Tab 200-25 MG: ORAL | 30 days supply | Qty: 30 | Fill #0 | Status: AC

## 2020-08-21 ENCOUNTER — Other Ambulatory Visit (HOSPITAL_COMMUNITY): Payer: Self-pay

## 2020-08-24 ENCOUNTER — Other Ambulatory Visit (HOSPITAL_COMMUNITY): Payer: Self-pay

## 2020-08-26 ENCOUNTER — Other Ambulatory Visit (HOSPITAL_COMMUNITY): Payer: Self-pay

## 2020-09-01 ENCOUNTER — Telehealth: Payer: Self-pay | Admitting: Family Medicine

## 2020-09-01 NOTE — Telephone Encounter (Signed)
Patient returned your call.  I have him scheduled with Dr. Hassan Rowan for a follow up appointment from ENT.  I wasn't able to clarify the referral information so I told the patient that I will have you give him a call back.

## 2020-09-01 NOTE — Telephone Encounter (Addendum)
Left patient message to call office back in regards to patient requesting a referral to an oral surgery Dr. Lincoln Brigham at [facility] [phone] [facility] for an on going issue patient is having with a knot of face.   Patient needs to make an appointment with Dr. Hassan Rowan due to patient never did a follow up after seeing the ENT on 02/06/2020 and not aware if patient completed a CT scan per the recommendation of the ENT provider.

## 2020-09-18 ENCOUNTER — Other Ambulatory Visit (HOSPITAL_COMMUNITY): Payer: Self-pay

## 2020-09-21 ENCOUNTER — Other Ambulatory Visit (HOSPITAL_COMMUNITY): Payer: Self-pay

## 2020-10-02 ENCOUNTER — Other Ambulatory Visit (HOSPITAL_COMMUNITY): Payer: Self-pay

## 2020-10-02 MED FILL — Dolutegravir Sodium Tab 50 MG (Base Equiv): ORAL | 30 days supply | Qty: 30 | Fill #1 | Status: AC

## 2020-10-02 MED FILL — Emtricitabine-Tenofovir Alafenamide Fumarate Tab 200-25 MG: ORAL | 30 days supply | Qty: 30 | Fill #1 | Status: AC

## 2020-10-16 ENCOUNTER — Other Ambulatory Visit: Payer: Self-pay

## 2020-10-16 ENCOUNTER — Ambulatory Visit (INDEPENDENT_AMBULATORY_CARE_PROVIDER_SITE_OTHER): Payer: Medicare HMO | Admitting: Family Medicine

## 2020-10-16 ENCOUNTER — Encounter: Payer: Self-pay | Admitting: Family Medicine

## 2020-10-16 VITALS — BP 100/80 | HR 55 | Temp 97.7°F | Ht 68.25 in | Wt 167.4 lb

## 2020-10-16 DIAGNOSIS — Z940 Kidney transplant status: Secondary | ICD-10-CM | POA: Diagnosis not present

## 2020-10-16 DIAGNOSIS — I10 Essential (primary) hypertension: Secondary | ICD-10-CM | POA: Diagnosis not present

## 2020-10-16 DIAGNOSIS — R220 Localized swelling, mass and lump, head: Secondary | ICD-10-CM

## 2020-10-16 DIAGNOSIS — B20 Human immunodeficiency virus [HIV] disease: Secondary | ICD-10-CM | POA: Diagnosis not present

## 2020-10-16 MED ORDER — SHINGRIX 50 MCG/0.5ML IM SUSR: 0.5000 mL | Freq: Once | INTRAMUSCULAR | 0 refills | Status: AC

## 2020-10-16 NOTE — Patient Instructions (Signed)
SalonClasses.at (either pfizer or moderna) for booster shot  Consider shingrix at pharmacy

## 2020-10-16 NOTE — Progress Notes (Signed)
Luis Reed DOB: February 16, 1961 Encounter date: 10/16/2020  This is a 60 y.o. male who presents with Chief Complaint  Patient presents with   Follow-up    Patient states he did not have a visit with an ENT for the facial mass and was advised to follow up with PCP    History of present illness:  Never went to see ENT for face mass follow up - states that they kept telling him they had to reschedule imaging due to COVID (Dr. Jenne Pane). Spot is still there; not sore. He did follow with Dr. Jenne Pane. Dr. Jenne Pane had ordered a [place] CT but this is what kept getting rescheduled. No trouble with breathing, no congestion, no nasal drainage.   HTN: blood pressure has been ok at home. He is compliant with amlodipine.   Hx of renal transplant: follows regularly. He is compliant with medications.   Insomnia: uses ambien; through renal doc.    Allergies  Allergen Reactions   Hydrocodone Itching and Other (See Comments)    fidgety   Current Meds  Medication Sig   amLODipine (NORVASC) 5 MG tablet Take 5 mg by mouth daily.   calcitRIOL (ROCALTROL) 0.5 MCG capsule Take 0.5 mcg by mouth at bedtime.   CELLCEPT 250 MG capsule Take 250 mg by mouth 2 (two) times daily.   dolutegravir (TIVICAY) 50 MG tablet TAKE 1 TABLET (50 MG TOTAL) BY MOUTH DAILY.   emtricitabine-tenofovir AF (DESCOVY) 200-25 MG tablet TAKE 1 TABLET BY MOUTH AT BEDTIME.   predniSONE (DELTASONE) 5 MG tablet Take 5 mg by mouth every Monday, Wednesday, and Friday at 8 PM.    tacrolimus (PROGRAF) 1 MG capsule Take 2 mg by mouth 2 (two) times daily.    tacrolimus (PROGRAF) 5 MG capsule Take 5 mg by mouth 2 (two) times daily.    zolpidem (AMBIEN) 10 MG tablet Take 10 mg by mouth at bedtime as needed for sleep.    [DISCONTINUED] amoxicillin-clavulanate (AUGMENTIN) 875-125 MG tablet Take 1 tablet by mouth 2 (two) times daily.   [DISCONTINUED] bisacodyl (DULCOLAX) 5 MG EC tablet Take 5 mg by mouth once. X 4 tabs for colon prep 7-28   Current  Facility-Administered Medications for the 10/16/20 encounter (Office Visit) with Wynn Banker, MD  Medication   betamethasone acetate-betamethasone sodium phosphate (CELESTONE) injection 3 mg    Review of Systems  Constitutional:  Negative for chills, fatigue and fever.  Respiratory:  Negative for cough, chest tightness, shortness of breath and wheezing.   Cardiovascular:  Negative for chest pain, palpitations and leg swelling.   Objective:  BP 100/80 (BP Location: Right Arm, Patient Position: Sitting, Cuff Size: Normal)   Pulse (!) 55   Temp 97.7 F (36.5 C) (Oral)   Ht 5' 8.25" (1.734 m)   Wt 167 lb 6.4 oz (75.9 kg)   SpO2 96%   BMI 25.27 kg/m   Weight: 167 lb 6.4 oz (75.9 kg)   BP Readings from Last 3 Encounters:  10/16/20 100/80  08/26/19 118/80  08/02/19 124/72   Wt Readings from Last 3 Encounters:  10/16/20 167 lb 6.4 oz (75.9 kg)  08/26/19 165 lb 9.6 oz (75.1 kg)  08/02/19 162 lb 9.6 oz (73.8 kg)    Physical Exam Constitutional:      General: He is not in acute distress.    Appearance: He is well-developed.  Cardiovascular:     Rate and Rhythm: Normal rate and regular rhythm.     Heart sounds: Normal heart sounds.  No murmur heard.   No friction rub.  Pulmonary:     Effort: Pulmonary effort is normal. No respiratory distress.     Breath sounds: Normal breath sounds. No wheezing or rales.  Musculoskeletal:     Right lower leg: No edema.     Left lower leg: No edema.  Neurological:     Mental Status: He is alert and oriented to person, place, and time.  Psychiatric:        Behavior: Behavior normal.    Assessment/Plan   1. Essential (primary) hypertension Well controlled; continue current medication.   2. Facial mass Wasn't able to complete imaging due to them rescheduling; I will order this today to help facilitate so he can follow up with ent. He would like this removed, even if benign.  - CT MAXILLOFACIAL W & WO CONTRAST; Future  3. Human  immunodeficiency virus (HIV) disease (HCC) Follows regularly with ID; management per Dr. Orvan Falconer.   4. KIDNEY TRANSPLANTATION Follows regularly with nephrology. Labwork is up to date. He is compliant with medications.   Return in about 6 months (around 04/17/2021) for physical exam.    Theodis Shove, MD

## 2020-10-22 ENCOUNTER — Ambulatory Visit
Admission: RE | Admit: 2020-10-22 | Discharge: 2020-10-22 | Disposition: A | Discharge status: Home / Self Care | Payer: Medicare HMO | Source: Ambulatory Visit | Attending: Family Medicine | Admitting: Family Medicine

## 2020-10-22 DIAGNOSIS — J3489 Other specified disorders of nose and nasal sinuses: Secondary | ICD-10-CM | POA: Diagnosis not present

## 2020-10-22 DIAGNOSIS — K048 Radicular cyst: Secondary | ICD-10-CM | POA: Diagnosis not present

## 2020-10-22 DIAGNOSIS — R220 Localized swelling, mass and lump, head: Secondary | ICD-10-CM

## 2020-10-22 DIAGNOSIS — J342 Deviated nasal septum: Secondary | ICD-10-CM | POA: Diagnosis not present

## 2020-10-22 MED ORDER — IOPAMIDOL (ISOVUE-300) INJECTION 61%
75.0000 mL | Freq: Once | INTRAVENOUS | Status: AC | PRN
  Administered 2020-10-22: 60 mL via INTRAVENOUS

## 2020-10-23 ENCOUNTER — Telehealth: Payer: Self-pay | Admitting: Family Medicine

## 2020-10-23 NOTE — Telephone Encounter (Signed)
See CT results note  

## 2020-10-23 NOTE — Telephone Encounter (Signed)
The patient called to let Dr. Hassan Rowan know the name of his dentist.  Luis Reed [phone]

## 2020-10-24 ENCOUNTER — Other Ambulatory Visit: Payer: Medicare HMO

## 2020-10-27 ENCOUNTER — Other Ambulatory Visit (HOSPITAL_COMMUNITY): Payer: Self-pay

## 2020-10-27 MED FILL — Dolutegravir Sodium Tab 50 MG (Base Equiv): ORAL | 30 days supply | Qty: 30 | Fill #2 | Status: AC

## 2020-10-27 MED FILL — Emtricitabine-Tenofovir Alafenamide Fumarate Tab 200-25 MG: ORAL | 30 days supply | Qty: 30 | Fill #2 | Status: AC

## 2020-10-29 ENCOUNTER — Other Ambulatory Visit (HOSPITAL_COMMUNITY): Payer: Self-pay

## 2020-11-30 ENCOUNTER — Other Ambulatory Visit (HOSPITAL_COMMUNITY): Payer: Self-pay

## 2020-11-30 MED FILL — Dolutegravir Sodium Tab 50 MG (Base Equiv): ORAL | 30 days supply | Qty: 30 | Fill #3 | Status: AC

## 2020-11-30 MED FILL — Emtricitabine-Tenofovir Alafenamide Fumarate Tab 200-25 MG: ORAL | 30 days supply | Qty: 30 | Fill #3 | Status: AC

## 2020-12-09 DIAGNOSIS — B1920 Unspecified viral hepatitis C without hepatic coma: Secondary | ICD-10-CM | POA: Diagnosis not present

## 2020-12-09 DIAGNOSIS — I10 Essential (primary) hypertension: Secondary | ICD-10-CM | POA: Diagnosis not present

## 2020-12-09 DIAGNOSIS — N189 Chronic kidney disease, unspecified: Secondary | ICD-10-CM | POA: Diagnosis not present

## 2020-12-09 DIAGNOSIS — T8611 Kidney transplant rejection: Secondary | ICD-10-CM | POA: Diagnosis not present

## 2020-12-09 DIAGNOSIS — R8279 Other abnormal findings on microbiological examination of urine: Secondary | ICD-10-CM | POA: Diagnosis not present

## 2020-12-09 DIAGNOSIS — B20 Human immunodeficiency virus [HIV] disease: Secondary | ICD-10-CM | POA: Diagnosis not present

## 2020-12-09 DIAGNOSIS — Z940 Kidney transplant status: Secondary | ICD-10-CM | POA: Diagnosis not present

## 2020-12-09 DIAGNOSIS — N2581 Secondary hyperparathyroidism of renal origin: Secondary | ICD-10-CM | POA: Diagnosis not present

## 2020-12-09 DIAGNOSIS — Z9225 Personal history of immunosupression therapy: Secondary | ICD-10-CM | POA: Diagnosis not present

## 2020-12-28 ENCOUNTER — Other Ambulatory Visit (HOSPITAL_COMMUNITY): Payer: Self-pay

## 2020-12-28 MED FILL — Emtricitabine-Tenofovir Alafenamide Fumarate Tab 200-25 MG: ORAL | 30 days supply | Qty: 30 | Fill #4 | Status: AC

## 2020-12-28 MED FILL — Dolutegravir Sodium Tab 50 MG (Base Equiv): ORAL | 30 days supply | Qty: 30 | Fill #4 | Status: AC

## 2021-01-04 ENCOUNTER — Other Ambulatory Visit (HOSPITAL_COMMUNITY): Payer: Self-pay

## 2021-01-06 ENCOUNTER — Ambulatory Visit (INDEPENDENT_AMBULATORY_CARE_PROVIDER_SITE_OTHER): Payer: Medicare HMO

## 2021-01-06 ENCOUNTER — Other Ambulatory Visit: Payer: Self-pay

## 2021-01-06 ENCOUNTER — Ambulatory Visit: Payer: Medicare HMO | Admitting: Orthopaedic Surgery

## 2021-01-06 DIAGNOSIS — M25511 Pain in right shoulder: Secondary | ICD-10-CM

## 2021-01-06 DIAGNOSIS — G8929 Other chronic pain: Secondary | ICD-10-CM | POA: Diagnosis not present

## 2021-01-06 MED ORDER — PREDNISONE 10 MG (21) PO TBPK: ORAL_TABLET | ORAL | 0 refills | Status: AC

## 2021-01-06 MED ORDER — MELOXICAM 7.5 MG PO TABS: 7.5000 mg | ORAL_TABLET | Freq: Two times a day (BID) | ORAL | 2 refills | Status: DC | PRN

## 2021-01-06 NOTE — Progress Notes (Signed)
Office Visit Note   Patient: Luis Reed           Date of Birth: 23-Oct-1960           MRN: [id number] Visit Date: 01/06/2021              Requested by: Wynn Banker, MD [address] [place],  [place] [address] PCP: Wynn Banker, MD   Assessment & Plan: Visit Diagnoses:  1. 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  I recommend that he take a couple of months off from lifting with that arm so that he can rest it.  I sent in a prescription for prednisone Dosepak since he cannot take NSAIDs due to history of kidney transplant.  He declined cortisone injection today.  Home exercises were provided that he will start in a couple weeks if he starts to feel improvement.  Follow-up as needed.  Follow-Up Instructions: Return if symptoms worsen or fail to improve.   Orders:  Orders Placed This Encounter  Procedures   XR Shoulder Right   Meds ordered this encounter  Medications   DISCONTD: meloxicam (MOBIC) 7.5 MG tablet    Sig: Take 1 tablet (7.5 mg total) by mouth 2 (two) times daily as needed for pain.    Dispense:  30 tablet    Refill:  2   predniSONE (STERAPRED UNI-PAK 21 TAB) 10 MG (21) TBPK tablet    Sig: Take as directed    Dispense:  21 tablet    Refill:  0      Procedures: No procedures performed   Clinical Data: No additional findings.   Subjective: Chief Complaint  Patient presents with   Right Shoulder - Pain    Luis Reed is a very pleasant 60 year old right-hand-dominant gentleman who comes in for chronic right shoulder pain for a few months without any specific injuries or trauma.  Denies any previous shoulder surgeries.  He feels pain to the posterior axilla and lateral aspect of the shoulder.  Denies any radiation or numbness and tingling or neck pain.   Denies any stiffness.  He has tried Tylenol for the pain which does not help significantly.   Review of Systems  Constitutional: Negative.   All other systems reviewed and are negative.   Objective: Vital Signs: There were no vitals taken for this visit.  Physical Exam Vitals and nursing note reviewed.  Constitutional:      Appearance: He is well-developed.  HENT:     Head: Normocephalic and atraumatic.  Eyes:     Pupils: Pupils are equal, round, and reactive to light.  Pulmonary:     Effort: Pulmonary effort is normal.  Abdominal:     Palpations: Abdomen is soft.  Musculoskeletal:        General: Normal range of motion.     Cervical back: Neck supple.  Skin:    General: Skin is warm.  Neurological:     Mental Status: He is alert and oriented to person, place, and time.  Psychiatric:        Behavior: Behavior normal.        Thought Content: Thought content normal.        Judgment: Judgment normal.    Ortho Exam Right shoulder examination shows full range of motion without significant pain or  limitation.  Positive Hawkins sign.  Empty can shows pain but no weakness.  AC joint nontender.  Negative Speed test.  Negative O'Brien.  He has reproducible pain to the lateral arm with resisted abduction. Specialty Comments:  No specialty comments available.  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   Essential (primary) hypertension 06/06/2011   KIDNEY TRANSPLANTATION 07/11/2007   Hepatitis C virus infection without hepatic coma 03/15/2006   SYPHILIS 03/15/2006   HYDROCELE NOS  03/15/2006   ALCOHOL ABUSE, HX OF 03/15/2006   HEPATITIS B, HX OF 03/15/2006   TOBACCO USE, QUIT 03/15/2006   PARATHYROIDECTOMY 08/08/2002   CEREBROVASCULAR ACCIDENT, ACUTE 10/08/1999   Human immunodeficiency virus (HIV) disease (HCC) 05/10/1999   Past Medical History:  Diagnosis Date   Blood transfusion without reported diagnosis    Chronic insomnia 07/18/2014   Hepatitis C    HIV infection (HCC)    Hypertension    Malignant hyperthermia    Renal disorder renal failure   sees Dr. Dierdre Highman    SEIZURE DISORDER 03/15/2006   Qualifier: History of  By: Orvan Falconer MD, John  - pt states last seizure 2000    Family History  Problem Relation Age of Onset   Prostate cancer Father 36   Colon cancer Neg Hx    Colon polyps Neg Hx    Esophageal cancer Neg Hx    Rectal cancer Neg Hx    Stomach cancer Neg Hx     Past Surgical History:  Procedure Laterality Date   AV FISTULA PLACEMENT     COLONOSCOPY  2004   LIGATION OF ARTERIOVENOUS  FISTULA Left 03/16/2018   Procedure: LIGATION OF ARTERIOVENOUS  FISTULA LEFT ARM;  Surgeon: Larina Earthly, MD;  Location: [facility] OR;  Service: Vascular;  Laterality: Left;   NEPHRECTOMY TRANSPLANTED ORGAN     Social History   Occupational History   Occupation: Disabled    Comment: Kidney disease   Tobacco Use   Smoking status: Former    Packs/day: 0.30    Years: 20.00    Pack years: 6.00    Types: Cigarettes   Smokeless tobacco: Never   Tobacco comments:    quit smoking cigaettes at age 21  Vaping Use   Vaping Use: Never used  Substance and Sexual Activity   Alcohol use: No   Drug use: No   Sexual activity: Not Currently    Partners: Female    Comment: declined condoms

## 2021-02-01 ENCOUNTER — Other Ambulatory Visit (HOSPITAL_COMMUNITY): Payer: Self-pay

## 2021-02-01 MED FILL — Dolutegravir Sodium Tab 50 MG (Base Equiv): ORAL | 30 days supply | Qty: 30 | Fill #5 | Status: AC

## 2021-02-01 MED FILL — Emtricitabine-Tenofovir Alafenamide Fumarate Tab 200-25 MG: ORAL | 30 days supply | Qty: 30 | Fill #5 | Status: AC

## 2021-02-04 ENCOUNTER — Other Ambulatory Visit (HOSPITAL_COMMUNITY): Payer: Self-pay

## 2021-02-22 ENCOUNTER — Encounter (HOSPITAL_COMMUNITY): Payer: Self-pay | Admitting: Emergency Medicine

## 2021-02-22 ENCOUNTER — Ambulatory Visit (HOSPITAL_COMMUNITY)
Admission: EM | Admit: 2021-02-22 | Discharge: 2021-02-22 | Disposition: A | Discharge status: Home / Self Care | Payer: Medicare HMO | Attending: Internal Medicine | Admitting: Internal Medicine

## 2021-02-22 ENCOUNTER — Other Ambulatory Visit: Payer: Self-pay

## 2021-02-22 ENCOUNTER — Ambulatory Visit (INDEPENDENT_AMBULATORY_CARE_PROVIDER_SITE_OTHER): Payer: Medicare HMO

## 2021-02-22 DIAGNOSIS — M7918 Myalgia, other site: Secondary | ICD-10-CM | POA: Insufficient documentation

## 2021-02-22 DIAGNOSIS — R0781 Pleurodynia: Secondary | ICD-10-CM | POA: Diagnosis not present

## 2021-02-22 LAB — BASIC METABOLIC PANEL
Anion gap: 6 (ref 5–15)
BUN: 11 mg/dL (ref 8–23)
CO2: 28 mmol/L (ref 22–32)
Calcium: 7.4 mg/dL — ABNORMAL LOW (ref 8.9–10.3)
Chloride: 105 mmol/L (ref 98–111)
Creatinine, Ser: 1.62 mg/dL — ABNORMAL HIGH (ref 0.61–1.24)
GFR, Estimated: 48 mL/min — ABNORMAL LOW (ref >60)
Glucose, Bld: 88 mg/dL (ref 70–99)
Potassium: 3.6 mmol/L (ref 3.5–5.1)
Sodium: 139 mmol/L (ref 135–145)

## 2021-02-22 MED ORDER — METHOCARBAMOL 500 MG PO TABS: 500.0000 mg | ORAL_TABLET | Freq: Every evening | ORAL | 0 refills | Status: AC | PRN

## 2021-02-22 NOTE — ED Provider Notes (Signed)
[facility]    CSN: [id number] Arrival date & time: 02/22/21  0854      History   Chief Complaint Chief Complaint  Patient presents with   Back Pain    HPI Luis Reed is a 60 y.o. male with a history of HIV infection on medication, status postrenal transplant on immunosuppressive agents comes to the urgent care with left-sided back pain of 1 week duration.  Pain is throbbing, 4 out of 10, aggravated by taking a deep breath and denies any relieving factors.  No preceding trauma or falls.  No shortness of breath, cough or sputum production.  No fever or chills. HPI  Past Medical History:  Diagnosis Date   Blood transfusion without reported diagnosis    Chronic insomnia 07/18/2014   Hepatitis C    HIV infection (HCC)    Hypertension    Malignant hyperthermia    Renal disorder renal failure   sees Dr. 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   Essential (primary) hypertension 06/06/2011   KIDNEY TRANSPLANTATION 07/11/2007   Hepatitis C virus infection without hepatic coma 03/15/2006   SYPHILIS 03/15/2006   HYDROCELE NOS 03/15/2006   ALCOHOL ABUSE, HX OF 03/15/2006   HEPATITIS B, HX OF 03/15/2006   TOBACCO USE, QUIT 03/15/2006   PARATHYROIDECTOMY 08/08/2002   CEREBROVASCULAR ACCIDENT, ACUTE 10/08/1999   Human immunodeficiency virus (HIV) disease (HCC) 05/10/1999    Past Surgical History:  Procedure Laterality Date   AV FISTULA PLACEMENT     COLONOSCOPY  2004   LIGATION OF ARTERIOVENOUS  FISTULA Left 03/16/2018   Procedure: LIGATION OF  ARTERIOVENOUS  FISTULA LEFT ARM;  Surgeon: Larina Earthly, MD;  Location: [facility];  Service: Vascular;  Laterality: Left;   NEPHRECTOMY TRANSPLANTED ORGAN         Home Medications    Prior to Admission medications   Medication Sig Start Date End Date Taking? Authorizing Provider  methocarbamol (ROBAXIN) 500 MG tablet Take 1 tablet (500 mg total) by mouth at bedtime as needed for muscle spasms. 02/22/21  Yes Kaydynce Pat, Britta Mccreedy, MD  amLODipine (NORVASC) 5 MG tablet Take 5 mg by mouth daily. 10/26/18   [provider]  calcitRIOL (ROCALTROL) 0.5 MCG capsule Take 0.5 mcg by mouth at bedtime. 12/19/17   [provider]  CELLCEPT 250 MG capsule Take 250 mg by mouth 2 (two) times daily. 01/29/14   [provider]  dolutegravir (TIVICAY) 50 MG tablet TAKE 1 TABLET (50 MG TOTAL) BY MOUTH DAILY. 02/20/20 03/06/21  Cliffton Asters, MD  emtricitabine-tenofovir AF (DESCOVY) 200-25 MG tablet TAKE 1 TABLET BY MOUTH AT BEDTIME. 02/20/20 03/06/21  Cliffton Asters, MD  predniSONE (DELTASONE) 5 MG tablet Take 5 mg by mouth every Monday, Wednesday, and Friday at 8 PM.     [provider]  predniSONE (STERAPRED UNI-PAK 21 TAB) 10 MG (21) TBPK tablet Take as directed 01/06/21   Tarry Kos, MD  tacrolimus (PROGRAF) 1 MG capsule Take 2 mg by mouth 2 (two) times daily.     [provider]  tacrolimus (PROGRAF) 5 MG capsule Take 5 mg by mouth 2 (two) times daily.     [provider]  zolpidem (AMBIEN) 10 MG tablet Take 10 mg by mouth at bedtime as needed for sleep.  11/03/17   [provider]    Family History Family History  Problem Relation Age of Onset   Prostate cancer Father 61   Colon cancer Neg Hx    Colon polyps Neg Hx    Esophageal cancer Neg Hx    Rectal cancer Neg Hx    Stomach cancer Neg Hx     Social History Social History   Tobacco Use   Smoking status: Former    Packs/day: 0.30    Years: 20.00    Pack years: 6.00    Types:  Cigarettes   Smokeless tobacco: Never   Tobacco comments:    quit smoking cigaettes at age 30  Vaping Use   Vaping Use: Never used  Substance Use Topics   Alcohol use: No   Drug use: No     Allergies   Hydrocodone   Review of Systems Review of Systems  Respiratory: Negative.    Cardiovascular:  Positive for chest pain.  Musculoskeletal:  Positive for back pain.  Hematological: Negative.     Physical Exam Triage Vital Signs ED Triage Vitals  Enc Vitals Group     BP 02/22/21 1047 133/76     Pulse Rate 02/22/21 1047 64     Resp 02/22/21 1047 16     Temp 02/22/21 1047 98 F (36.7 C)     Temp Source 02/22/21 1047 Oral     SpO2 02/22/21 1047 99 %     Weight --      Height --      Head Circumference --      Peak Flow --      Pain Score 02/22/21 1046 3     Pain Loc --      Pain Edu? --      Excl. in GC? --    No data found.  Updated Vital Signs BP 133/76 (BP Location: Right Arm)   Pulse 64   Temp 98 F (36.7 C) (Oral)   Resp 16   SpO2 99%   Visual Acuity Right Eye Distance:   Left Eye Distance:   Bilateral Distance:    Right Eye Near:   Left Eye Near:    Bilateral Near:     Physical Exam Vitals and nursing note reviewed.  Constitutional:      General: He is not in acute distress.    Appearance: He is not ill-appearing.  Cardiovascular:     Rate and Rhythm: Normal rate and regular rhythm.     Pulses: Normal pulses.     Heart sounds: Normal heart sounds.  Pulmonary:     Effort: Pulmonary effort is normal. No respiratory distress.     Breath sounds: Normal breath sounds. No wheezing, rhonchi or rales.  Musculoskeletal:     Comments: Tenderness on palpation of the left paraspinal muscle.  No bruising.  Neurological:     Mental Status: He is alert.     UC Treatments / Results  Labs (all labs ordered are listed, but only abnormal results are displayed) Labs Reviewed  BASIC METABOLIC PANEL - Abnormal; Notable for the following components:       Result Value   Creatinine, Ser 1.62 (*)    Calcium 7.4 (*)    GFR, Estimated 48 (*)  All other components within normal limits    EKG   Radiology DG Chest 2 View  Result Date: 02/22/2021 CLINICAL DATA:  Pleuritic pain. EXAM: CHEST - 2 VIEW COMPARISON:  05/16/2019 FINDINGS: Both lungs are clear. Negative for a pneumothorax. Heart and mediastinum are within normal limits. Trachea is midline. No large pleural effusions. No acute bone abnormality. IMPRESSION: No active cardiopulmonary disease. Electronically Signed   By: Richarda Overlie M.D.   On: 02/22/2021 12:22    Procedures Procedures (including critical care time)  Medications Ordered in UC Medications - No data to display  Initial Impression / Assessment and Plan / UC Course  I have reviewed the triage vital signs and the nursing notes.  Pertinent labs & imaging results that were available during my care of the patient were reviewed by me and considered in my medical decision making (see chart for details).    1.  Pain of the paraspinal muscle: Chest x-ray is negative for acute lung infiltrate Robaxin 500 mg at bedtime Muscle relaxant medication precautions given Gentle range of motion exercises Heating pad use Return to urgent care if symptoms worsen.  Final Clinical Impressions(s) / UC Diagnoses   Final diagnoses:  Pain of paraspinal muscle     Discharge Instructions      Your x-ray is negative for pneumonia or fracture Gentle range of motion exercises Heating pad use only 20 minutes on-20 minutes off cycle Please take medications as prescribed If symptoms worsen please return to urgent care to be reevaluated.     ED Prescriptions     Medication Sig Dispense Auth. Provider   methocarbamol (ROBAXIN) 500 MG tablet Take 1 tablet (500 mg total) by mouth at bedtime as needed for muscle spasms. 20 tablet Dericka Ostenson, Britta Mccreedy, MD      PDMP not reviewed this encounter.   Merrilee Jansky, MD 02/22/21 [phone]

## 2021-02-22 NOTE — ED Triage Notes (Signed)
Pt presents with left side middle back pain xs 1 week. Denies any fall or injury. States hurts with deep breath.

## 2021-02-22 NOTE — Discharge Instructions (Addendum)
Your x-ray is negative for pneumonia or fracture Gentle range of motion exercises Heating pad use only 20 minutes on-20 minutes off cycle Please take medications as prescribed If symptoms worsen please return to urgent care to be reevaluated.

## 2021-02-23 ENCOUNTER — Ambulatory Visit: Payer: Medicare HMO

## 2021-03-01 ENCOUNTER — Other Ambulatory Visit (HOSPITAL_COMMUNITY): Payer: Self-pay

## 2021-03-01 ENCOUNTER — Other Ambulatory Visit: Payer: Self-pay | Admitting: Internal Medicine

## 2021-03-01 DIAGNOSIS — B20 Human immunodeficiency virus [HIV] disease: Secondary | ICD-10-CM

## 2021-03-01 MED ORDER — DESCOVY 200-25 MG PO TABS
1.0000 | ORAL_TABLET | Freq: Every day | ORAL | 0 refills | Status: DC
  Filled 2021-03-01: qty 30, 30d supply, fill #0

## 2021-03-01 MED ORDER — TIVICAY 50 MG PO TABS
ORAL_TABLET | Freq: Every day | ORAL | 0 refills | Status: DC
  Filled 2021-03-01: qty 30, 30d supply, fill #0

## 2021-03-02 ENCOUNTER — Ambulatory Visit (INDEPENDENT_AMBULATORY_CARE_PROVIDER_SITE_OTHER): Payer: Medicare HMO

## 2021-03-02 ENCOUNTER — Other Ambulatory Visit: Payer: Self-pay

## 2021-03-02 DIAGNOSIS — Z0000 Encounter for general adult medical examination without abnormal findings: Secondary | ICD-10-CM

## 2021-03-02 NOTE — Progress Notes (Signed)
Virtual Visit via Telephone Note  I connected with  Luis Reed on 03/02/21 at 10:15 AM EDT by telephone and verified that I am speaking with the correct person using two identifiers.  Medicare Annual Wellness visit completed telephonically due to Covid-19 pandemic.   Persons participating in this call: This Health Coach and this patient.   Location: Patient: Home Provider: Office   I discussed the limitations, risks, security and privacy concerns of performing an evaluation and management service by telephone and the availability of in person appointments. The patient expressed understanding and agreed to proceed.  Unable to perform video visit due to video visit attempted and failed and/or patient does not have video capability.   Some vital signs may be absent or patient reported.   Marzella Schlein, LPN   Subjective:   Luis Reed is a 60 y.o. male who presents for Medicare Annual/Subsequent preventive examination.  Review of Systems     Cardiac Risk Factors include: advanced age (>48men, >12 women);hypertension;male gender     Objective:    There were no vitals filed for this visit. There is no height or weight on file to calculate BMI.  Advanced Directives 03/02/2021 02/19/2020 05/16/2019 03/16/2018 04/01/2017 09/26/2015 09/09/2014  Does Patient Have a Medical Advance Directive? No No No No No No No  Would patient like information on creating a medical advance directive? No - Patient declined No - Patient declined No - Patient declined No - Patient declined No - Patient declined No - patient declined information -    Current Medications (verified) Outpatient Encounter Medications as of 03/02/2021  Medication Sig   amLODipine (NORVASC) 5 MG tablet Take 5 mg by mouth daily.   calcitRIOL (ROCALTROL) 0.5 MCG capsule Take 0.5 mcg by mouth at bedtime.   CELLCEPT 250 MG capsule Take 250 mg by mouth 2 (two) times daily.   dolutegravir (TIVICAY) 50 MG tablet TAKE 1  TABLET (50 MG TOTAL) BY MOUTH DAILY.   emtricitabine-tenofovir AF (DESCOVY) 200-25 MG tablet TAKE 1 TABLET BY MOUTH AT BEDTIME.   methocarbamol (ROBAXIN) 500 MG tablet Take 1 tablet (500 mg total) by mouth at bedtime as needed for muscle spasms.   predniSONE (DELTASONE) 5 MG tablet Take 5 mg by mouth every Monday, Wednesday, and Friday at 8 PM.    tacrolimus (PROGRAF) 1 MG capsule Take 2 mg by mouth 2 (two) times daily.    tacrolimus (PROGRAF) 5 MG capsule Take 5 mg by mouth 2 (two) times daily.    zolpidem (AMBIEN) 10 MG tablet Take 10 mg by mouth at bedtime as needed for sleep.    predniSONE (STERAPRED UNI-PAK 21 TAB) 10 MG (21) TBPK tablet Take as directed (Patient not taking: Reported on 03/02/2021)   Facility-Administered Encounter Medications as of 03/02/2021  Medication   betamethasone acetate-betamethasone sodium phosphate (CELESTONE) injection 3 mg    Allergies (verified) Hydrocodone   History: Past Medical History:  Diagnosis Date   Blood transfusion without reported diagnosis    Chronic insomnia 07/18/2014   Hepatitis C    HIV infection (HCC)    Hypertension    Malignant hyperthermia    Renal disorder renal failure   sees Dr. Dierdre Highman    SEIZURE DISORDER 03/15/2006   Qualifier: History of  By: Orvan Falconer MD, John  - pt states last seizure 2000   Past Surgical History:  Procedure Laterality Date   AV FISTULA PLACEMENT     COLONOSCOPY  2004   LIGATION OF ARTERIOVENOUS  FISTULA Left 03/16/2018   Procedure: LIGATION OF ARTERIOVENOUS  FISTULA LEFT ARM;  Surgeon: Larina Earthly, MD;  Location: [facility];  Service: Vascular;  Laterality: Left;   NEPHRECTOMY TRANSPLANTED ORGAN     Family History  Problem Relation Age of Onset   Prostate cancer Father 12   Colon cancer Neg Hx    Colon polyps Neg Hx    Esophageal cancer Neg Hx    Rectal cancer Neg Hx    Stomach cancer Neg Hx    Social History   Socioeconomic History   Marital status: Married    Spouse name: Steward Drone    Number of children: 2   Years of education: 12   Highest education level: Not on file  Occupational History   Occupation: Disabled    Comment: Kidney disease   Tobacco Use   Smoking status: Former    Packs/day: 0.30    Years: 20.00    Pack years: 6.00    Types: Cigarettes   Smokeless tobacco: Never   Tobacco comments:    quit smoking cigaettes at age 32  Vaping Use   Vaping Use: Never used  Substance and Sexual Activity   Alcohol use: No   Drug use: No   Sexual activity: Not Currently    Partners: Female    Comment: declined condoms  Other Topics Concern   Not on file  Social History Narrative   Born and raised in [place]. Currently resides in an house with his wife. No pets. Fun: fishing.    Denies religious beliefs that would effect health care.    Caffeine none.   Social Determinants of Health   Financial Resource Strain: Low Risk    Difficulty of Paying Living Expenses: Not hard at all  Food Insecurity: No Food Insecurity   Worried About Programme researcher, broadcasting/film/video in the Last Year: Never true   Ran Out of Food in the Last Year: Never true  Transportation Needs: No Transportation Needs   Lack of Transportation (Medical): No   Lack of Transportation (Non-Medical): No  Physical Activity: Sufficiently Active   Days of Exercise per Week: 5 days   Minutes of Exercise per Session: 60 min  Stress: No Stress Concern Present   Feeling of Stress : Not at all  Social Connections: Moderately Isolated   Frequency of Communication with Friends and Family: More than three times a week   Frequency of Social Gatherings with Friends and Family: More than three times a week   Attends Religious Services: More than 4 times per year   Active Member of [employer] or Organizations: No   Attends Banker Meetings: Never   Marital Status: Separated    Tobacco Counseling Counseling given: Not Answered Tobacco comments: quit smoking cigaettes at age 1   Clinical  Intake:  Pre-visit preparation completed: Yes  Pain : No/denies pain     BMI - recorded: 25.27 Nutritional Status: BMI 25 -29 Overweight Nutritional Risks: None Diabetes: No  How often do you need to have someone help you when you read instructions, pamphlets, or other written materials from your doctor or pharmacy?: 1 - Never  Diabetic?no  Interpreter Needed?: No  Information entered by :: Lanier Ensign, LPN   Activities of Daily Living In your present state of health, do you have any difficulty performing the following activities: 03/02/2021  Hearing? N  Vision? N  Difficulty concentrating or making decisions? N  Walking or climbing stairs? N  Dressing or bathing?  N  Doing errands, shopping? N  Preparing Food and eating ? N  Using the Toilet? N  In the past six months, have you accidently leaked urine? N  Do you have problems with loss of bowel control? N  Managing your Medications? N  Managing your Finances? N  Housekeeping or managing your Housekeeping? N  Some recent data might be hidden    Patient Care Team: Wynn Banker, MD as PCP - General (Family Medicine) Cliffton Asters, MD as Consulting Physician (Infectious Diseases) Terrial Rhodes, MD as Consulting Physician (Nephrology)  Indicate any recent Medical Services you may have received from other than [facility] providers in the past year (date may be approximate).     Assessment:   This is a routine wellness examination for Basil.  Hearing/Vision screen Hearing Screening - Comments:: Pt denies any hearing issues  [facility] Screening - Comments:: [facility]   Dietary issues and exercise activities discussed: Current Exercise Habits: Home exercise routine, Type of exercise: Other - see comments (ride bike), Time (Minutes): 60, Frequency (Times/Week): 5, Weekly Exercise (Minutes/Week): 300   Goals Addressed             This Visit's Progress    Patient  Stated       Run a marathon       Depression Screen PHQ 2/9 Scores 03/02/2021 02/19/2020 02/12/2019 02/01/2019 11/21/2017 12/01/2016 10/08/2015  PHQ - 2 Score 0 0 0 0 0 0 0    Fall Risk Fall Risk  03/02/2021 02/19/2020 02/12/2019 11/21/2017 12/01/2016  Falls in the past year? - 0 0 No No  Number falls in past yr: 0 0 0 - -  Injury with Fall? 0 0 0 - -  Risk for fall due to : Impaired vision Impaired vision - - -  Follow up Falls prevention discussed Falls prevention discussed - - -    FALL RISK PREVENTION PERTAINING TO THE HOME:  Any stairs in or around the home? Yes  If so, are there any without handrails? No  Home free of loose throw rugs in walkways, pet beds, electrical cords, etc? Yes  Adequate lighting in your home to reduce risk of falls? Yes   ASSISTIVE DEVICES UTILIZED TO PREVENT FALLS:  Life alert? No  Use of a cane, walker or w/c? No  Grab bars in the bathroom? Yes  Shower chair or bench in shower? No  Elevated toilet seat or a handicapped toilet? No   TIMED UP AND GO:  Was the test performed? No .   Cognitive Function:     6CIT Screen 03/02/2021 02/19/2020  What Year? 0 points 0 points  What month? 0 points 0 points  What time? 0 points -  Count back from 20 0 points 0 points  Months in reverse 4 points 4 points  Repeat phrase 6 points 10 points  Total Score 10 -    Immunizations Immunization History  Administered Date(s) Administered   Influenza Split 02/10/2011, 01/08/2012   Influenza Whole 02/06/2005, 03/14/2006, 02/09/2009, 02/09/2010   Influenza,inj,Quad PF,6+ Mos 01/17/2013, 01/16/2014, 04/23/2015, 02/20/2020   Influenza-Unspecified 04/08/2005, 07/26/2005, 02/07/2007, 03/10/2019   Janssen (J&J) SARS-COV-2 Vaccination 07/08/2019   Pneumococcal Polysaccharide-23 07/26/2005, 10/18/2005, 02/10/2011   Pneumococcal-Unspecified 02/25/2005    TDAP status: Up to date  Flu Vaccine status: Due, Education has been provided regarding the importance of this  vaccine. Advised may receive this vaccine at local pharmacy or Health Dept. Aware to provide a copy  of the vaccination record if obtained from local pharmacy or Health Dept. Verbalized acceptance and understanding.  Pneumococcal vaccine status: Due, Education has been provided regarding the importance of this vaccine. Advised may receive this vaccine at local pharmacy or Health Dept. Aware to provide a copy of the vaccination record if obtained from local pharmacy or Health Dept. Verbalized acceptance and understanding.  Covid-19 vaccine status: Completed vaccines  Qualifies for Shingles Vaccine? Yes   Zostavax completed No   Shingrix Completed?: No.    Education has been provided regarding the importance of this vaccine. Patient has been advised to call insurance company to determine out of pocket expense if they have not yet received this vaccine. Advised may also receive vaccine at local pharmacy or Health Dept. Verbalized acceptance and understanding.  Screening Tests Health Maintenance  Topic Date Due   Zoster Vaccines- Shingrix (1 of 2) Never done   Pneumococcal Vaccine 76-80 Years old (3 - PCV) 02/10/2012   COVID-19 Vaccine (2 - Janssen risk series) 08/05/2019   INFLUENZA VACCINE  12/07/2020   Fecal DNA (Cologuard)  08/12/2022   TETANUS/TDAP  11/07/2022   Hepatitis C Screening  Completed   HIV Screening  Completed   HPV VACCINES  Aged Out    Health Maintenance  Health Maintenance Due  Topic Date Due   Zoster Vaccines- Shingrix (1 of 2) Never done   Pneumococcal Vaccine 84-19 Years old (3 - PCV) 02/10/2012   COVID-19 Vaccine (2 - Janssen risk series) 08/05/2019   INFLUENZA VACCINE  12/07/2020    Colorectal cancer screening: Type of screening: Cologuard. Completed 08/12/19. Repeat every 3 years   Additional Screening:  Hepatitis C Screening:  Completed 12/01/16  Vision Screening: Recommended annual ophthalmology exams for early detection of glaucoma and other disorders of  the eye. Is the patient up to date with their annual eye exam?  Yes  Who is the provider or what is the [facility] of [facility] which the [facility]  If pt is not established with a provider, would they like to be referred to a provider to establish care? No .   Dental Screening: Recommended annual dental exams for proper oral hygiene  Community Resource Referral / Chronic Care Management: CRR required this visit?  No   CCM required this visit?  No      Plan:     I have personally reviewed and noted the following in the patient's chart:   Medical and social history Use of alcohol, tobacco or illicit drugs  Current medications and supplements including opioid prescriptions. Patient is not currently taking opioid prescriptions. Functional ability and status Nutritional status Physical activity Advanced directives List of other physicians Hospitalizations, surgeries, and ER visits in previous 12 months Vitals Screenings to include cognitive, depression, and falls Referrals and appointments  In addition, I have reviewed and discussed with patient certain preventive protocols, quality metrics, and best practice recommendations. A written personalized care plan for preventive services as well as general preventive health recommendations were provided to patient.     Marzella Schlein, LPN   [id number]   Nurse Notes: None

## 2021-03-02 NOTE — Patient Instructions (Signed)
Luis Reed , Thank you for taking time to come for your Medicare Wellness Visit. I appreciate your ongoing commitment to your health goals. Please review the following plan we discussed and let me know if I can assist you in the future.   Screening recommendations/referrals: Colonoscopy: Cologuard 08/12/19 repeat every 3 years  Recommended yearly ophthalmology/optometry visit for glaucoma screening and checkup Recommended yearly dental visit for hygiene and checkup  Vaccinations: Influenza vaccine: Due and discussed Pneumococcal vaccine: Due and discussed  Tdap vaccine: Done 12/01/16 repeat every 10 years  Shingles vaccine: Shingrix discussed. Please contact your pharmacy for coverage information.    Covid-19: Completed 07/08/19  Advanced directives: Advance directive discussed with you today. Even though you declined this today please call our office should you change your mind and we can give you the proper paperwork for you to fill out.  Conditions/risks identified: Run a marathon  Next appointment: Follow up in one year for your annual wellness visit   Preventive Care 40-64 Years, Male Preventive care refers to lifestyle choices and visits with your health care provider that can promote health and wellness. What does preventive care include? A yearly physical exam. This is also called an annual well check. Dental exams once or twice a year. Routine eye exams. Ask your health care provider how often you should have your eyes checked. Personal lifestyle choices, including: Daily care of your teeth and gums. Regular physical activity. Eating a healthy diet. Avoiding tobacco and drug use. Limiting alcohol use. Practicing safe sex. Taking low-dose aspirin every day starting at age 56. What happens during an annual well check? The services and screenings done by your health care provider during your annual well check will depend on your age, overall health, lifestyle risk factors, and  family history of disease. Counseling  Your health care provider may ask you questions about your: Alcohol use. Tobacco use. Drug use. Emotional well-being. Home and relationship well-being. Sexual activity. Eating habits. Work and work Astronomer. Screening  You may have the following tests or measurements: Height, weight, and BMI. Blood pressure. Lipid and cholesterol levels. These may be checked every 5 years, or more frequently if you are over 10 years old. Skin check. Lung cancer screening. You may have this screening every year starting at age 10 if you have a 30-pack-year history of smoking and currently smoke or have quit within the past 15 years. Fecal occult blood test (FOBT) of the stool. You may have this test every year starting at age 43. Flexible sigmoidoscopy or colonoscopy. You may have a sigmoidoscopy every 5 years or a colonoscopy every 10 years starting at age 60. Prostate cancer screening. Recommendations will vary depending on your family history and other risks. Hepatitis C blood test. Hepatitis B blood test. Sexually transmitted disease (STD) testing. Diabetes screening. This is done by checking your blood sugar (glucose) after you have not eaten for a while (fasting). You may have this done every 1-3 years. Discuss your test results, treatment options, and if necessary, the need for more tests with your health care provider. Vaccines  Your health care provider may recommend certain vaccines, such as: Influenza vaccine. This is recommended every year. Tetanus, diphtheria, and acellular pertussis (Tdap, Td) vaccine. You may need a Td booster every 10 years. Zoster vaccine. You may need this after age 16. Pneumococcal 13-valent conjugate (PCV13) vaccine. You may need this if you have certain conditions and have not been vaccinated. Pneumococcal polysaccharide (PPSV23) vaccine. You may need one  or two doses if you smoke cigarettes or if you have certain  conditions. Talk to your health care provider about which screenings and vaccines you need and how often you need them. This information is not intended to replace advice given to you by your health care provider. Make sure you discuss any questions you have with your health care provider. Document Released: 05/22/2015 Document Revised: 01/13/2016 Document Reviewed: 02/24/2015 Elsevier Interactive Patient Education  2017 [employer].  Fall Prevention in the Home Falls can cause injuries. They can happen to people of all ages. There are many things you can do to make your home safe and to help prevent falls. What can I do on the outside of my home? Regularly fix the edges of walkways and driveways and fix any cracks. Remove anything that might make you trip as you walk through a door, such as a raised step or threshold. Trim any bushes or trees on the path to your home. Use bright outdoor lighting. Clear any walking paths of anything that might make someone trip, such as rocks or tools. Regularly check to see if handrails are loose or broken. Make sure that both sides of any steps have handrails. Any raised decks and porches should have guardrails on the edges. Have any leaves, snow, or ice cleared regularly. Use sand or salt on walking paths during winter. Clean up any spills in your garage right away. This includes oil or grease spills. What can I do in the bathroom? Use night lights. Install grab bars by the toilet and in the tub and shower. Do not use towel bars as grab bars. Use non-skid mats or decals in the tub or shower. If you need to sit down in the shower, use a plastic, non-slip stool. Keep the floor dry. Clean up any water that spills on the floor as soon as it happens. Remove soap buildup in the tub or shower regularly. Attach bath mats securely with double-sided non-slip rug tape. Do not have throw rugs and other things on the floor that can make you trip. What can I do in  the bedroom? Use night lights. Make sure that you have a light by your bed that is easy to reach. Do not use any sheets or blankets that are too big for your bed. They should not hang down onto the floor. Have a firm chair that has side arms. You can use this for support while you get dressed. Do not have throw rugs and other things on the floor that can make you trip. What can I do in the kitchen? Clean up any spills right away. Avoid walking on wet floors. Keep items that you use a lot in easy-to-reach places. If you need to reach something above you, use a strong step stool that has a grab bar. Keep electrical cords out of the way. Do not use floor polish or wax that makes floors slippery. If you must use wax, use non-skid floor wax. Do not have throw rugs and other things on the floor that can make you trip. What can I do with my stairs? Do not leave any items on the stairs. Make sure that there are handrails on both sides of the stairs and use them. Fix handrails that are broken or loose. Make sure that handrails are as long as the stairways. Check any carpeting to make sure that it is firmly attached to the stairs. Fix any carpet that is loose or worn. Avoid having throw rugs  at the top or bottom of the stairs. If you do have throw rugs, attach them to the floor with carpet tape. Make sure that you have a light switch at the top of the stairs and the bottom of the stairs. If you do not have them, ask someone to add them for you. What else can I do to help prevent falls? Wear shoes that: Do not have high heels. Have rubber bottoms. Are comfortable and fit you well. Are closed at the toe. Do not wear sandals. If you use a stepladder: Make sure that it is fully opened. Do not climb a closed stepladder. Make sure that both sides of the stepladder are locked into place. Ask someone to hold it for you, if possible. Clearly mark and make sure that you can see: Any grab bars or  handrails. First and last steps. Where the edge of each step is. Use tools that help you move around (mobility aids) if they are needed. These include: Canes. Walkers. Scooters. Crutches. Turn on the lights when you go into a dark area. Replace any light bulbs as soon as they burn out. Set up your furniture so you have a clear path. Avoid moving your furniture around. If any of your floors are uneven, fix them. If there are any pets around you, be aware of where they are. Review your medicines with your doctor. Some medicines can make you feel dizzy. This can increase your chance of falling. Ask your doctor what other things that you can do to help prevent falls. This information is not intended to replace advice given to you by your health care provider. Make sure you discuss any questions you have with your health care provider. Document Released: 02/19/2009 Document Revised: 10/01/2015 Document Reviewed: 05/30/2014 Elsevier Interactive Patient Education  2017 [employer].

## 2021-03-03 ENCOUNTER — Other Ambulatory Visit: Payer: Self-pay

## 2021-03-03 DIAGNOSIS — Z113 Encounter for screening for infections with a predominantly sexual mode of transmission: Secondary | ICD-10-CM

## 2021-03-03 DIAGNOSIS — Z79899 Other long term (current) drug therapy: Secondary | ICD-10-CM

## 2021-03-03 DIAGNOSIS — B20 Human immunodeficiency virus [HIV] disease: Secondary | ICD-10-CM

## 2021-03-04 ENCOUNTER — Other Ambulatory Visit (HOSPITAL_COMMUNITY): Payer: Self-pay

## 2021-03-08 NOTE — Addendum Note (Signed)
Addended by: Radford Pax M on: 03/08/2021 10:36 AM   Modules accepted: Kipp Brood

## 2021-03-10 ENCOUNTER — Other Ambulatory Visit: Payer: Self-pay

## 2021-03-10 ENCOUNTER — Other Ambulatory Visit: Payer: Medicare HMO

## 2021-03-10 ENCOUNTER — Other Ambulatory Visit (HOSPITAL_COMMUNITY)
Admission: RE | Admit: 2021-03-10 | Discharge: 2021-03-10 | Disposition: A | Discharge status: Home / Self Care | Payer: Medicare HMO | Source: Ambulatory Visit | Attending: Internal Medicine | Admitting: Internal Medicine

## 2021-03-10 DIAGNOSIS — Z113 Encounter for screening for infections with a predominantly sexual mode of transmission: Secondary | ICD-10-CM | POA: Insufficient documentation

## 2021-03-10 DIAGNOSIS — B20 Human immunodeficiency virus [HIV] disease: Secondary | ICD-10-CM | POA: Diagnosis not present

## 2021-03-10 DIAGNOSIS — Z79899 Other long term (current) drug therapy: Secondary | ICD-10-CM | POA: Diagnosis not present

## 2021-03-11 ENCOUNTER — Other Ambulatory Visit: Payer: Medicare HMO

## 2021-03-11 LAB — T-HELPER CELL (CD4) - (RCID CLINIC ONLY)
CD4 % Helper T Cell: 34 % (ref 33–65)
CD4 T Cell Abs: 405 /uL (ref 400–1790)

## 2021-03-11 LAB — URINE CYTOLOGY ANCILLARY ONLY
Chlamydia: NEGATIVE
Comment: NEGATIVE
Comment: NORMAL
Neisseria Gonorrhea: NEGATIVE

## 2021-03-12 LAB — LIPID PANEL
Cholesterol: 131 mg/dL (ref <200)
HDL: 67 mg/dL (ref >=40)
LDL Cholesterol (Calc): 52 mg/dL (calc)
Non-HDL Cholesterol (Calc): 64 mg/dL (calc) (ref <130)
Total CHOL/HDL Ratio: 2 (calc) (ref <5.0)
Triglycerides: 43 mg/dL (ref <150)

## 2021-03-12 LAB — CBC WITH DIFFERENTIAL/PLATELET
Absolute Monocytes: 281 cells/uL (ref 200–950)
Basophils Absolute: 18 cells/uL (ref 0–200)
Basophils Relative: 0.4 %
Eosinophils Absolute: 9 cells/uL — ABNORMAL LOW (ref 15–500)
Eosinophils Relative: 0.2 %
HCT: 40.2 % (ref 38.5–50.0)
Hemoglobin: 13.8 g/dL (ref 13.2–17.1)
Lymphs Abs: 1164 cells/uL (ref 850–3900)
MCH: 33.3 pg — ABNORMAL HIGH (ref 27.0–33.0)
MCHC: 34.3 g/dL (ref 32.0–36.0)
MCV: 97.1 fL (ref 80.0–100.0)
MPV: 9.6 fL (ref 7.5–12.5)
Monocytes Relative: 6.1 %
Neutro Abs: 3128 cells/uL (ref 1500–7800)
Neutrophils Relative %: 68 %
Platelets: 234 10*3/uL (ref 140–400)
RBC: 4.14 10*6/uL — ABNORMAL LOW (ref 4.20–5.80)
RDW: 12.7 % (ref 11.0–15.0)
Total Lymphocyte: 25.3 %
WBC: 4.6 10*3/uL (ref 3.8–10.8)

## 2021-03-12 LAB — COMPREHENSIVE METABOLIC PANEL
AG Ratio: 1.4 (calc) (ref 1.0–2.5)
ALT: 8 U/L — ABNORMAL LOW (ref 9–46)
AST: 15 U/L (ref 10–35)
Albumin: 4.2 g/dL (ref 3.6–5.1)
Alkaline phosphatase (APISO): 60 U/L (ref 35–144)
BUN/Creatinine Ratio: 12 (calc) (ref 6–22)
BUN: 18 mg/dL (ref 7–25)
CO2: 24 mmol/L (ref 20–32)
Calcium: 7.9 mg/dL — ABNORMAL LOW (ref 8.6–10.3)
Chloride: 107 mmol/L (ref 98–110)
Creat: 1.5 mg/dL — ABNORMAL HIGH (ref 0.70–1.35)
Globulin: 3.1 g/dL (calc) (ref 1.9–3.7)
Glucose, Bld: 94 mg/dL (ref 65–99)
Potassium: 3.6 mmol/L (ref 3.5–5.3)
Sodium: 140 mmol/L (ref 135–146)
Total Bilirubin: 0.6 mg/dL (ref 0.2–1.2)
Total Protein: 7.3 g/dL (ref 6.1–8.1)

## 2021-03-12 LAB — RPR: RPR Ser Ql: NONREACTIVE

## 2021-03-12 LAB — HIV-1 RNA QUANT-NO REFLEX-BLD
HIV 1 RNA Quant: NOT DETECTED Copies/mL
HIV-1 RNA Quant, Log: NOT DETECTED Log cps/mL

## 2021-03-15 DIAGNOSIS — N189 Chronic kidney disease, unspecified: Secondary | ICD-10-CM | POA: Diagnosis not present

## 2021-03-15 DIAGNOSIS — Z940 Kidney transplant status: Secondary | ICD-10-CM | POA: Diagnosis not present

## 2021-03-24 ENCOUNTER — Encounter: Payer: Self-pay | Admitting: Internal Medicine

## 2021-03-25 ENCOUNTER — Other Ambulatory Visit (HOSPITAL_COMMUNITY): Payer: Self-pay

## 2021-03-25 ENCOUNTER — Ambulatory Visit (INDEPENDENT_AMBULATORY_CARE_PROVIDER_SITE_OTHER): Payer: Medicare HMO | Admitting: Internal Medicine

## 2021-03-25 ENCOUNTER — Other Ambulatory Visit: Payer: Self-pay

## 2021-03-25 ENCOUNTER — Encounter: Payer: Self-pay | Admitting: Internal Medicine

## 2021-03-25 ENCOUNTER — Other Ambulatory Visit: Payer: Self-pay | Admitting: Pharmacist

## 2021-03-25 ENCOUNTER — Ambulatory Visit (INDEPENDENT_AMBULATORY_CARE_PROVIDER_SITE_OTHER): Payer: Medicare HMO

## 2021-03-25 VITALS — BP 135/84 | HR 61 | Temp 98.7°F | Wt 168.0 lb

## 2021-03-25 DIAGNOSIS — B20 Human immunodeficiency virus [HIV] disease: Secondary | ICD-10-CM | POA: Diagnosis not present

## 2021-03-25 DIAGNOSIS — Z23 Encounter for immunization: Secondary | ICD-10-CM | POA: Diagnosis not present

## 2021-03-25 MED ORDER — DESCOVY 200-25 MG PO TABS
1.0000 | ORAL_TABLET | Freq: Every day | ORAL | 11 refills | Status: DC
  Filled 2021-03-25 – 2021-03-31 (×4): qty 30, 30d supply, fill #0
  Filled 2021-04-26 – 2021-04-27 (×2): qty 30, 30d supply, fill #1
  Filled 2021-05-24: qty 30, 30d supply, fill #2
  Filled 2021-06-25 – 2021-06-30 (×2): qty 30, 30d supply, fill #3
  Filled 2021-07-20: qty 30, 30d supply, fill #4
  Filled 2021-08-18: qty 30, 30d supply, fill #5
  Filled 2021-09-09: qty 30, 30d supply, fill #6
  Filled 2021-10-18: qty 30, 30d supply, fill #7
  Filled 2021-11-11: qty 30, 30d supply, fill #8
  Filled 2021-12-15: qty 30, 30d supply, fill #9
  Filled 2022-01-24: qty 30, 30d supply, fill #10
  Filled 2022-02-14: qty 30, 30d supply, fill #11

## 2021-03-25 MED ORDER — TIVICAY 50 MG PO TABS: ORAL_TABLET | Freq: Every day | ORAL | 0 refills | Status: DC

## 2021-03-25 MED ORDER — TIVICAY 50 MG PO TABS
ORAL_TABLET | Freq: Every day | ORAL | 11 refills | Status: DC
  Filled 2021-03-25: qty 30, fill #0
  Filled 2021-03-31 (×4): qty 30, 30d supply, fill #0
  Filled 2021-04-26 – 2021-04-27 (×2): qty 30, 30d supply, fill #1
  Filled 2021-05-24: qty 30, 30d supply, fill #2
  Filled 2021-06-25 – 2021-06-30 (×2): qty 30, 30d supply, fill #3
  Filled 2021-07-20: qty 30, 30d supply, fill #4
  Filled 2021-08-18: qty 30, 30d supply, fill #5
  Filled 2021-09-09: qty 30, 30d supply, fill #6
  Filled 2021-10-18: qty 30, 30d supply, fill #7
  Filled 2021-11-11: qty 30, 30d supply, fill #8
  Filled 2021-12-15: qty 30, 30d supply, fill #9
  Filled 2022-01-24: qty 30, 30d supply, fill #10
  Filled 2022-02-14: qty 30, 30d supply, fill #11

## 2021-03-25 MED ORDER — DESCOVY 200-25 MG PO TABS: 1.0000 | ORAL_TABLET | Freq: Every day | ORAL | 0 refills | Status: DC

## 2021-03-25 NOTE — Progress Notes (Signed)
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   CALCITRIOL (ROCALTROL) 0.5 MCG CAPSULE    Take 0.5 mcg by mouth at bedtime.   CELLCEPT 250 MG CAPSULE    Take 250 mg by mouth 2 (two) times daily.   METHOCARBAMOL (ROBAXIN) 500 MG TABLET    Take 1 tablet (500 mg total) by mouth at bedtime as needed for muscle spasms.   PREDNISONE (DELTASONE) 5 MG TABLET    Take 5 mg by mouth every Monday, Wednesday, and Friday at 8 PM.    PREDNISONE (STERAPRED UNI-PAK 21 TAB) 10 MG (21) TBPK TABLET    Take as directed   TACROLIMUS (PROGRAF) 1 MG CAPSULE    Take 2 mg by mouth 2 (two) times daily.    TACROLIMUS (PROGRAF) 5 MG CAPSULE    Take 5 mg by mouth 2 (two) times daily.    ZOLPIDEM (AMBIEN) 10 MG TABLET    Take 10 mg by mouth at bedtime as needed for sleep.   Modified Medications   Modified Medication Previous Medication   DOLUTEGRAVIR (TIVICAY) 50 MG TABLET dolutegravir (TIVICAY) 50 MG tablet      TAKE 1 TABLET (50 MG TOTAL) BY MOUTH DAILY.    TAKE 1 TABLET (50 MG TOTAL) BY MOUTH DAILY.    EMTRICITABINE-TENOFOVIR AF (DESCOVY) 200-25 MG TABLET emtricitabine-tenofovir AF (DESCOVY) 200-25 MG tablet      TAKE 1 TABLET BY MOUTH AT BEDTIME.    TAKE 1 TABLET BY MOUTH AT BEDTIME.  Discontinued Medications   No medications on file    Subjective: Luis Reed is in for his routine HIV follow-up visit.  He has not had any problems obtaining, taking or tolerating his Descovy or Tivicay.  He can recall missing only 2 doses in the past 10 years.  He is feeling well.  He gets exercise riding his bike.  He denies feeling anxious or depressed.  Review of Systems: Review of Systems  Constitutional:  Negative for fever and malaise/fatigue.  Psychiatric/Behavioral:  Negative for depression. The patient is not nervous/anxious.    Past Medical History:  Diagnosis Date   Blood transfusion without reported diagnosis    Chronic insomnia 07/18/2014   Hepatitis C    HIV infection (HCC)    Hypertension    Malignant hyperthermia    Renal disorder renal failure   sees Dr. Dierdre Highman    SEIZURE DISORDER 03/15/2006   Qualifier: History of  By: Orvan Falconer MD, Minami Arriaga  - pt states last seizure 2000    Social  History   Tobacco Use   Smoking status: Former    Packs/day: 0.30    Years: 20.00    Pack years: 6.00    Types: Cigarettes   Smokeless tobacco: Never   Tobacco comments:    quit smoking cigaettes at age 58  Vaping Use   Vaping Use: Never used  Substance Use Topics   Alcohol use: No   Drug use: No    Family History  Problem Relation Age of Onset   Prostate cancer Father 1   Colon cancer Neg Hx    Colon polyps Neg Hx    Esophageal cancer Neg Hx    Rectal cancer Neg Hx    Stomach cancer Neg Hx     Allergies  Allergen Reactions   Hydrocodone Itching and Other (See Comments)    fidgety    Health Maintenance  Topic Date Due   Zoster Vaccines- Shingrix (1 of 2) Never done   Pneumococcal Vaccine 61-43 Years old (3 - PCV) 02/10/2012   COVID-19 Vaccine (2 - Janssen risk series)  08/05/2019   INFLUENZA VACCINE  12/07/2020   Fecal DNA (Cologuard)  08/12/2022   TETANUS/TDAP  11/07/2022   Hepatitis C Screening  Completed   HIV Screening  Completed   HPV VACCINES  Aged Out   COLONOSCOPY (Pts 45-91yrs Insurance coverage will need to be confirmed)  Discontinued    Objective:  Vitals:   03/25/21 0902  BP: 135/84  Pulse: 61  Temp: 98.7 F (37.1 C)  TempSrc: Temporal  Weight: 168 lb (76.2 kg)   Body mass index is 25.36 kg/m.  Physical Exam Constitutional:      Comments: He is pleasant and low-key as usual.  Cardiovascular:     Rate and Rhythm: Normal rate.  Pulmonary:     Effort: Pulmonary effort is normal.  Psychiatric:        Mood and Affect: Mood normal.    Lab Results Lab Results  Component Value Date   WBC 4.6 03/10/2021   HGB 13.8 03/10/2021   HCT 40.2 03/10/2021   MCV 97.1 03/10/2021   PLT 234 03/10/2021    Lab Results  Component Value Date   CREATININE 1.50 (H) 03/10/2021   BUN 18 03/10/2021   NA 140 03/10/2021   K 3.6 03/10/2021   CL 107 03/10/2021   CO2 24 03/10/2021    Lab Results  Component Value Date   ALT 8 (L) 03/10/2021   AST 15 03/10/2021   GGT 38 07/14/2014   ALKPHOS 55 08/12/2017   BILITOT 0.6 03/10/2021    Lab Results  Component Value Date   CHOL 131 03/10/2021   HDL 67 03/10/2021   LDLCALC 52 03/10/2021   TRIG 43 03/10/2021   CHOLHDL 2.0 03/10/2021   Lab Results  Component Value Date   LABRPR NON-REACTIVE 03/10/2021   HIV 1 RNA Quant  Date Value  03/10/2021 Not Detected Copies/mL  01/29/2020 <20 Copies/mL  01/31/2019 <20 NOT DETECTED copies/mL   CD4 T Cell Abs (/uL)  Date Value  03/10/2021 405  01/29/2020 692  01/31/2019 804     Problem List Items Addressed This Visit       High   Human immunodeficiency virus (HIV) disease (HCC)    His infection remains under excellent, long-term control.  I gave him the option of consolidating to [place] but he prefers to continue Descovy and Tivicay.  He  received his annual influenza and COVID booster vaccines here today.  He will follow-up after  lab work in 1 year      Relevant Medications   dolutegravir (TIVICAY) 50 MG tablet   emtricitabine-tenofovir AF (DESCOVY) 200-25 MG tablet   Other Relevant Orders   CBC   T-helper cell (CD4)- ([facility] clinic only)   Comprehensive metabolic panel   Lipid panel   RPR   HIV-1 RNA quant-no reflex-bld      Cliffton Asters, MD [facility] for Infectious Disease [facility] [phone] pager   [phone] cell 03/25/2021, 10:10 AM

## 2021-03-25 NOTE — Assessment & Plan Note (Signed)
His infection remains under excellent, long-term control.  I gave him the option of consolidating to [place] but he prefers to continue Descovy and Tivicay.  He received his annual influenza and COVID booster vaccines here today.  He will follow-up after lab work in 1 year

## 2021-03-25 NOTE — Progress Notes (Signed)
Duplicate entry. Addended Dr. Blair Dolphin encounter today to resend Descovy/Tivicay prescriptions with 30 days supply x 11 refills to [facility].  Margarite Gouge, PharmD, CPP Clinical Pharmacist Practitioner Infectious Diseases Clinical Pharmacist [facility] for Infectious Disease

## 2021-03-25 NOTE — Progress Notes (Signed)
   Covid-19 Vaccination Clinic  Name:  JAMARRIUS SALAY    MRN: [id number] DOB: 1961/01/11  03/25/2021  Mr. Donatelli was observed post Covid-19 immunization for 15 minutes without incident. He was provided with Vaccine Information Sheet and instruction to access the V-Safe system.   Mr. Captain was instructed to call 911 with any severe reactions post vaccine: Difficulty breathing  Swelling of face and throat  A fast heartbeat  A bad rash all over body  Dizziness and weakness  Mallory Schaad T Pricilla Loveless

## 2021-03-25 NOTE — Addendum Note (Signed)
Addended by: Jennette Kettle on: 03/25/2021 12:00 PM   Modules accepted: Orders

## 2021-03-29 ENCOUNTER — Other Ambulatory Visit (HOSPITAL_COMMUNITY): Payer: Self-pay

## 2021-03-31 ENCOUNTER — Other Ambulatory Visit (HOSPITAL_COMMUNITY): Payer: Self-pay

## 2021-04-13 ENCOUNTER — Other Ambulatory Visit (HOSPITAL_COMMUNITY): Payer: Self-pay

## 2021-04-19 ENCOUNTER — Encounter: Payer: Medicare HMO | Admitting: Family Medicine

## 2021-04-21 DIAGNOSIS — I10 Essential (primary) hypertension: Secondary | ICD-10-CM | POA: Diagnosis not present

## 2021-04-21 DIAGNOSIS — Z9225 Personal history of immunosupression therapy: Secondary | ICD-10-CM | POA: Diagnosis not present

## 2021-04-21 DIAGNOSIS — B20 Human immunodeficiency virus [HIV] disease: Secondary | ICD-10-CM | POA: Diagnosis not present

## 2021-04-21 DIAGNOSIS — R8279 Other abnormal findings on microbiological examination of urine: Secondary | ICD-10-CM | POA: Diagnosis not present

## 2021-04-21 DIAGNOSIS — B1920 Unspecified viral hepatitis C without hepatic coma: Secondary | ICD-10-CM | POA: Diagnosis not present

## 2021-04-21 DIAGNOSIS — T8611 Kidney transplant rejection: Secondary | ICD-10-CM | POA: Diagnosis not present

## 2021-04-21 DIAGNOSIS — N2581 Secondary hyperparathyroidism of renal origin: Secondary | ICD-10-CM | POA: Diagnosis not present

## 2021-04-21 DIAGNOSIS — Z940 Kidney transplant status: Secondary | ICD-10-CM | POA: Diagnosis not present

## 2021-04-26 ENCOUNTER — Other Ambulatory Visit (HOSPITAL_COMMUNITY): Payer: Self-pay

## 2021-04-27 ENCOUNTER — Other Ambulatory Visit (HOSPITAL_COMMUNITY): Payer: Self-pay

## 2021-05-19 ENCOUNTER — Other Ambulatory Visit (HOSPITAL_COMMUNITY): Payer: Self-pay

## 2021-05-21 ENCOUNTER — Other Ambulatory Visit (HOSPITAL_COMMUNITY): Payer: Self-pay

## 2021-05-24 ENCOUNTER — Other Ambulatory Visit (HOSPITAL_COMMUNITY): Payer: Self-pay

## 2021-05-27 ENCOUNTER — Other Ambulatory Visit (HOSPITAL_COMMUNITY): Payer: Self-pay

## 2021-06-16 ENCOUNTER — Other Ambulatory Visit (HOSPITAL_COMMUNITY): Payer: Self-pay

## 2021-06-25 ENCOUNTER — Other Ambulatory Visit (HOSPITAL_COMMUNITY): Payer: Self-pay

## 2021-06-28 DIAGNOSIS — Z940 Kidney transplant status: Secondary | ICD-10-CM | POA: Diagnosis not present

## 2021-06-28 DIAGNOSIS — N189 Chronic kidney disease, unspecified: Secondary | ICD-10-CM | POA: Diagnosis not present

## 2021-06-30 ENCOUNTER — Telehealth: Payer: Self-pay

## 2021-06-30 ENCOUNTER — Other Ambulatory Visit (HOSPITAL_COMMUNITY): Payer: Self-pay

## 2021-06-30 NOTE — Telephone Encounter (Signed)
[facility] Patient Advocate Encounter   I was successful in securing patient a $ 7500.00 grant from [employer] to provide copayment coverage for Descovy & Tivicay.  The patient's out of pocket cost will be $0.00 monthly.     I have spoken with the patient.    The billing information is as follows and has been shared with [facility].        Dates of Eligibility: 06/30/21 through 05/08/22  Patient knows to call the office with questions or concerns.  Clearance Coots, CPhT Specialty Pharmacy Patient [facility] for Infectious Disease Phone: [phone] Fax:  [phone]

## 2021-07-06 DIAGNOSIS — H524 Presbyopia: Secondary | ICD-10-CM | POA: Diagnosis not present

## 2021-07-06 DIAGNOSIS — H52223 Regular astigmatism, bilateral: Secondary | ICD-10-CM | POA: Diagnosis not present

## 2021-07-06 DIAGNOSIS — H2513 Age-related nuclear cataract, bilateral: Secondary | ICD-10-CM | POA: Diagnosis not present

## 2021-07-06 DIAGNOSIS — H40013 Open angle with borderline findings, low risk, bilateral: Secondary | ICD-10-CM | POA: Diagnosis not present

## 2021-07-20 ENCOUNTER — Other Ambulatory Visit (HOSPITAL_COMMUNITY): Payer: Self-pay

## 2021-07-28 ENCOUNTER — Other Ambulatory Visit (HOSPITAL_COMMUNITY): Payer: Self-pay

## 2021-08-12 ENCOUNTER — Other Ambulatory Visit (HOSPITAL_COMMUNITY): Payer: Self-pay

## 2021-08-13 ENCOUNTER — Other Ambulatory Visit (HOSPITAL_COMMUNITY): Payer: Self-pay

## 2021-08-16 ENCOUNTER — Other Ambulatory Visit (HOSPITAL_COMMUNITY): Payer: Self-pay

## 2021-08-18 ENCOUNTER — Other Ambulatory Visit (HOSPITAL_COMMUNITY): Payer: Self-pay

## 2021-08-24 ENCOUNTER — Other Ambulatory Visit (HOSPITAL_COMMUNITY): Payer: Self-pay

## 2021-09-03 DIAGNOSIS — Z9225 Personal history of immunosupression therapy: Secondary | ICD-10-CM | POA: Diagnosis not present

## 2021-09-03 DIAGNOSIS — B20 Human immunodeficiency virus [HIV] disease: Secondary | ICD-10-CM | POA: Diagnosis not present

## 2021-09-03 DIAGNOSIS — N2581 Secondary hyperparathyroidism of renal origin: Secondary | ICD-10-CM | POA: Diagnosis not present

## 2021-09-03 DIAGNOSIS — Z940 Kidney transplant status: Secondary | ICD-10-CM | POA: Diagnosis not present

## 2021-09-03 DIAGNOSIS — B1920 Unspecified viral hepatitis C without hepatic coma: Secondary | ICD-10-CM | POA: Diagnosis not present

## 2021-09-03 DIAGNOSIS — I10 Essential (primary) hypertension: Secondary | ICD-10-CM | POA: Diagnosis not present

## 2021-09-03 DIAGNOSIS — R8279 Other abnormal findings on microbiological examination of urine: Secondary | ICD-10-CM | POA: Diagnosis not present

## 2021-09-03 DIAGNOSIS — T8611 Kidney transplant rejection: Secondary | ICD-10-CM | POA: Diagnosis not present

## 2021-09-07 ENCOUNTER — Other Ambulatory Visit (HOSPITAL_COMMUNITY): Payer: Self-pay

## 2021-09-09 ENCOUNTER — Other Ambulatory Visit (HOSPITAL_COMMUNITY): Payer: Self-pay

## 2021-09-21 ENCOUNTER — Other Ambulatory Visit (HOSPITAL_COMMUNITY): Payer: Self-pay

## 2021-09-24 ENCOUNTER — Ambulatory Visit: Payer: Medicare HMO | Admitting: Family Medicine

## 2021-09-24 NOTE — Progress Notes (Unsigned)
Luis Reed DOB: 1960/11/09 Encounter date: 09/24/2021  This is a 61 y.o. male who presents for complete physical   History of present illness/Additional concerns: Last visit with me was 10/16/2020.  Hypertension: History of renal transplant: Follows regularly with [facility].  Last visit was 08/2021. Insomnia: on ambien through nephrology HIV: Follows regularly with infectious disease.  Last visit was 03/25/2021.  Yearly visits.  They also manage his insomnia and have been giving him Ambien.  Irritated they are checking blood work on him.  Lipid Panel     Component Value Date/Time   CHOL 131 03/10/2021 1413   TRIG 43 03/10/2021 1413   HDL 67 03/10/2021 1413   CHOLHDL 2.0 03/10/2021 1413   VLDL 14 12/01/2016 0914   LDLCALC 52 03/10/2021 1413    ***  Past Medical History:  Diagnosis Date   Blood transfusion without reported diagnosis    Chronic insomnia 07/18/2014   Hepatitis C    HIV infection ([place])    Hypertension    Malignant hyperthermia    Renal disorder renal failure   sees Dr. Katrine Coho    SEIZURE DISORDER 03/15/2006   Qualifier: History of  By: Megan Salon MD, John  - pt states last seizure 2000   Past Surgical History:  Procedure Laterality Date   AV FISTULA PLACEMENT     COLONOSCOPY  2004   LIGATION OF ARTERIOVENOUS  FISTULA Left 03/16/2018   Procedure: LIGATION OF ARTERIOVENOUS  FISTULA LEFT ARM;  Surgeon: Rosetta Posner, MD;  Location: [facility] OR;  Service: Vascular;  Laterality: Left;   NEPHRECTOMY TRANSPLANTED ORGAN     Allergies  Allergen Reactions   Hydrocodone Itching and Other (See Comments)    fidgety   No outpatient medications have been marked as taking for the 09/24/21 encounter (Appointment) with Caren Macadam, MD.   Current Facility-Administered Medications for the 09/24/21 encounter (Appointment) with Caren Macadam, MD  Medication   betamethasone acetate-betamethasone sodium phosphate (CELESTONE) injection 3 mg   Social  History   Tobacco Use   Smoking status: Former    Packs/day: 0.30    Years: 20.00    Pack years: 6.00    Types: Cigarettes   Smokeless tobacco: Never   Tobacco comments:    quit smoking cigaettes at age 33  Substance Use Topics   Alcohol use: No   Family History  Problem Relation Age of Onset   Prostate cancer Father 21   Colon cancer Neg Hx    Colon polyps Neg Hx    Esophageal cancer Neg Hx    Rectal cancer Neg Hx    Stomach cancer Neg Hx      Review of Systems  CBC:  Lab Results  Component Value Date   WBC 4.6 03/10/2021   HGB 13.8 03/10/2021   HCT 40.2 03/10/2021   MCH 33.3 (H) 03/10/2021   MCHC 34.3 03/10/2021   RDW 12.7 03/10/2021   PLT 234 03/10/2021   MPV 9.6 03/10/2021   CMP: Lab Results  Component Value Date   NA 140 03/10/2021   K 3.6 03/10/2021   CL 107 03/10/2021   CO2 24 03/10/2021   ANIONGAP 6 02/22/2021   GLUCOSE 94 03/10/2021   BUN 18 03/10/2021   CREATININE 1.50 (H) 03/10/2021   GFRAA 45 (L) 05/16/2019   GFRAA 46 (L) 11/06/2017   CALCIUM 7.9 (L) 03/10/2021   CALCIUM 6.9 (L) 02/01/2011   PROT 7.3 03/10/2021   BILITOT 0.6 03/10/2021   ALKPHOS 55  08/12/2017   ALT 8 (L) 03/10/2021   ALT 22 07/14/2014   AST 15 03/10/2021   LIPID: Lab Results  Component Value Date   CHOL 131 03/10/2021   TRIG 43 03/10/2021   HDL 67 03/10/2021   LDLCALC 52 03/10/2021    Objective:  There were no vitals taken for this visit.      BP Readings from Last 3 Encounters:  03/25/21 135/84  02/22/21 133/76  10/16/20 100/80   Wt Readings from Last 3 Encounters:  03/25/21 168 lb (76.2 kg)  10/16/20 167 lb 6.4 oz (75.9 kg)  08/26/19 165 lb 9.6 oz (75.1 kg)    Physical Exam  Assessment/Plan: Health Maintenance Due  Topic Date Due   Zoster Vaccines- Shingrix (1 of 2) Never done   COVID-19 Vaccine (3 - Booster for Janssen series) 05/20/2021   Health Maintenance reviewed - {health maintenance:315237}.  There are no diagnoses linked to this  encounter.  No follow-ups on file.  Micheline Rough, MD

## 2021-10-12 ENCOUNTER — Other Ambulatory Visit (HOSPITAL_COMMUNITY): Payer: Self-pay

## 2021-10-14 ENCOUNTER — Other Ambulatory Visit (HOSPITAL_COMMUNITY): Payer: Self-pay

## 2021-10-18 ENCOUNTER — Other Ambulatory Visit (HOSPITAL_COMMUNITY): Payer: Self-pay

## 2021-10-20 ENCOUNTER — Other Ambulatory Visit (HOSPITAL_COMMUNITY): Payer: Self-pay

## 2021-11-11 ENCOUNTER — Other Ambulatory Visit (HOSPITAL_COMMUNITY): Payer: Self-pay

## 2021-11-16 ENCOUNTER — Other Ambulatory Visit (HOSPITAL_COMMUNITY): Payer: Self-pay

## 2021-12-07 ENCOUNTER — Other Ambulatory Visit (HOSPITAL_COMMUNITY): Payer: Self-pay

## 2021-12-09 ENCOUNTER — Other Ambulatory Visit (HOSPITAL_COMMUNITY): Payer: Self-pay

## 2021-12-13 ENCOUNTER — Other Ambulatory Visit (HOSPITAL_COMMUNITY): Payer: Self-pay

## 2021-12-15 ENCOUNTER — Other Ambulatory Visit (HOSPITAL_COMMUNITY): Payer: Self-pay

## 2021-12-22 DIAGNOSIS — I10 Essential (primary) hypertension: Secondary | ICD-10-CM | POA: Diagnosis not present

## 2021-12-22 DIAGNOSIS — R8279 Other abnormal findings on microbiological examination of urine: Secondary | ICD-10-CM | POA: Diagnosis not present

## 2021-12-22 DIAGNOSIS — B20 Human immunodeficiency virus [HIV] disease: Secondary | ICD-10-CM | POA: Diagnosis not present

## 2021-12-22 DIAGNOSIS — N189 Chronic kidney disease, unspecified: Secondary | ICD-10-CM | POA: Diagnosis not present

## 2021-12-22 DIAGNOSIS — Z9225 Personal history of immunosupression therapy: Secondary | ICD-10-CM | POA: Diagnosis not present

## 2021-12-22 DIAGNOSIS — Z940 Kidney transplant status: Secondary | ICD-10-CM | POA: Diagnosis not present

## 2021-12-22 DIAGNOSIS — T8611 Kidney transplant rejection: Secondary | ICD-10-CM | POA: Diagnosis not present

## 2021-12-22 DIAGNOSIS — B1920 Unspecified viral hepatitis C without hepatic coma: Secondary | ICD-10-CM | POA: Diagnosis not present

## 2021-12-22 DIAGNOSIS — N2581 Secondary hyperparathyroidism of renal origin: Secondary | ICD-10-CM | POA: Diagnosis not present

## 2022-01-05 ENCOUNTER — Other Ambulatory Visit (HOSPITAL_COMMUNITY): Payer: Self-pay

## 2022-01-07 ENCOUNTER — Other Ambulatory Visit (HOSPITAL_COMMUNITY): Payer: Self-pay

## 2022-01-24 ENCOUNTER — Other Ambulatory Visit (HOSPITAL_COMMUNITY): Payer: Self-pay

## 2022-02-10 ENCOUNTER — Other Ambulatory Visit (HOSPITAL_COMMUNITY): Payer: Self-pay

## 2022-02-14 ENCOUNTER — Other Ambulatory Visit (HOSPITAL_COMMUNITY): Payer: Self-pay

## 2022-02-16 ENCOUNTER — Other Ambulatory Visit (HOSPITAL_COMMUNITY): Payer: Self-pay

## 2022-03-14 ENCOUNTER — Other Ambulatory Visit (HOSPITAL_COMMUNITY): Payer: Self-pay

## 2022-03-14 ENCOUNTER — Other Ambulatory Visit: Payer: Self-pay | Admitting: Internal Medicine

## 2022-03-14 DIAGNOSIS — B20 Human immunodeficiency virus [HIV] disease: Secondary | ICD-10-CM

## 2022-03-14 MED ORDER — DESCOVY 200-25 MG PO TABS
1.0000 | ORAL_TABLET | Freq: Every day | ORAL | 0 refills | Status: DC
  Filled 2022-03-14: qty 30, 30d supply, fill #0

## 2022-03-14 MED ORDER — TIVICAY 50 MG PO TABS
ORAL_TABLET | Freq: Every day | ORAL | 0 refills | Status: DC
  Filled 2022-03-14: qty 30, 30d supply, fill #0

## 2022-03-15 ENCOUNTER — Ambulatory Visit (INDEPENDENT_AMBULATORY_CARE_PROVIDER_SITE_OTHER): Payer: Medicare HMO

## 2022-03-15 VITALS — Ht 68.0 in | Wt 168.0 lb

## 2022-03-15 DIAGNOSIS — Z0000 Encounter for general adult medical examination without abnormal findings: Secondary | ICD-10-CM | POA: Diagnosis not present

## 2022-03-15 NOTE — Patient Instructions (Addendum)
Luis Reed , Thank you for taking time to come for your Medicare Wellness Visit. I appreciate your ongoing commitment to your health goals. Please review the following plan we discussed and let me know if I can assist you in the future.   These are the goals we discussed:  Goals       No current goals (pt-stated)      Patient Stated      Stay healthy       Patient Stated      Run a marathon        This is a list of the screening recommended for you and due dates:  Health Maintenance  Topic Date Due   Flu Shot  12/07/2021   COVID-19 Vaccine (3 - Janssen risk series) 03/31/2022*   Zoster (Shingles) Vaccine (1 of 2) 06/15/2022*   Cologuard (Stool DNA test)  08/12/2022   Tetanus Vaccine  11/07/2022   Medicare Annual Wellness Visit  03/16/2023   Hepatitis C Screening: USPSTF Recommendation to screen - Ages 18-79 yo.  Completed   HIV Screening  Completed   HPV Vaccine  Aged Out   Colon Cancer Screening  Discontinued  *Topic was postponed. The date shown is not the original due date.    Advanced directives: Advance directive discussed with you today. Even though you declined this today, please call our office should you change your mind, and we can give you the proper paperwork for you to fill out.   Conditions/risks identified: None  Next appointment: Follow up in one year for your annual wellness visit    Preventive Care 40-64 Years, Male Preventive care refers to lifestyle choices and visits with your health care provider that can promote health and wellness. What does preventive care include? A yearly physical exam. This is also called an annual well check. Dental exams once or twice a year. Routine eye exams. Ask your health care provider how often you should have your eyes checked. Personal lifestyle choices, including: Daily care of your teeth and gums. Regular physical activity. Eating a healthy diet. Avoiding tobacco and drug use. Limiting alcohol  use. Practicing safe sex. Taking low-dose aspirin every day starting at age 79. What happens during an annual well check? The services and screenings done by your health care provider during your annual well check will depend on your age, overall health, lifestyle risk factors, and family history of disease. Counseling  Your health care provider may ask you questions about your: Alcohol use. Tobacco use. Drug use. Emotional well-being. Home and relationship well-being. Sexual activity. Eating habits. Work and work Astronomer. Screening  You may have the following tests or measurements: Height, weight, and BMI. Blood pressure. Lipid and cholesterol levels. These may be checked every 5 years, or more frequently if you are over 25 years old. Skin check. Lung cancer screening. You may have this screening every year starting at age 37 if you have a 30-pack-year history of smoking and currently smoke or have quit within the past 15 years. Fecal occult blood test (FOBT) of the stool. You may have this test every year starting at age 60. Flexible sigmoidoscopy or colonoscopy. You may have a sigmoidoscopy every 5 years or a colonoscopy every 10 years starting at age 16. Prostate cancer screening. Recommendations will vary depending on your family history and other risks. Hepatitis C blood test. Hepatitis B blood test. Sexually transmitted disease (STD) testing. Diabetes screening. This is done by checking your blood sugar (glucose) after you  have not eaten for a while (fasting). You may have this done every 1-3 years. Discuss your test results, treatment options, and if necessary, the need for more tests with your health care provider. Vaccines  Your health care provider may recommend certain vaccines, such as: Influenza vaccine. This is recommended every year. Tetanus, diphtheria, and acellular pertussis (Tdap, Td) vaccine. You may need a Td booster every 10 years. Zoster vaccine. You may  need this after age 46. Pneumococcal 13-valent conjugate (PCV13) vaccine. You may need this if you have certain conditions and have not been vaccinated. Pneumococcal polysaccharide (PPSV23) vaccine. You may need one or two doses if you smoke cigarettes or if you have certain conditions. Talk to your health care provider about which screenings and vaccines you need and how often you need them. This information is not intended to replace advice given to you by your health care provider. Make sure you discuss any questions you have with your health care provider. Document Released: 05/22/2015 Document Revised: 01/13/2016 Document Reviewed: 02/24/2015 Elsevier Interactive Patient Education  2017 [employer].  Fall Prevention in the Home Falls can cause injuries. They can happen to people of all ages. There are many things you can do to make your home safe and to help prevent falls. What can I do on the outside of my home? Regularly fix the edges of walkways and driveways and fix any cracks. Remove anything that might make you trip as you walk through a door, such as a raised step or threshold. Trim any bushes or trees on the path to your home. Use bright outdoor lighting. Clear any walking paths of anything that might make someone trip, such as rocks or tools. Regularly check to see if handrails are loose or broken. Make sure that both sides of any steps have handrails. Any raised decks and porches should have guardrails on the edges. Have any leaves, snow, or ice cleared regularly. Use sand or salt on walking paths during winter. Clean up any spills in your garage right away. This includes oil or grease spills. What can I do in the bathroom? Use night lights. Install grab bars by the toilet and in the tub and shower. Do not use towel bars as grab bars. Use non-skid mats or decals in the tub or shower. If you need to sit down in the shower, use a plastic, non-slip stool. Keep the floor dry.  Clean up any water that spills on the floor as soon as it happens. Remove soap buildup in the tub or shower regularly. Attach bath mats securely with double-sided non-slip rug tape. Do not have throw rugs and other things on the floor that can make you trip. What can I do in the bedroom? Use night lights. Make sure that you have a light by your bed that is easy to reach. Do not use any sheets or blankets that are too big for your bed. They should not hang down onto the floor. Have a firm chair that has side arms. You can use this for support while you get dressed. Do not have throw rugs and other things on the floor that can make you trip. What can I do in the kitchen? Clean up any spills right away. Avoid walking on wet floors. Keep items that you use a lot in easy-to-reach places. If you need to reach something above you, use a strong step stool that has a grab bar. Keep electrical cords out of the way. Do not use  floor polish or wax that makes floors slippery. If you must use wax, use non-skid floor wax. Do not have throw rugs and other things on the floor that can make you trip. What can I do with my stairs? Do not leave any items on the stairs. Make sure that there are handrails on both sides of the stairs and use them. Fix handrails that are broken or loose. Make sure that handrails are as long as the stairways. Check any carpeting to make sure that it is firmly attached to the stairs. Fix any carpet that is loose or worn. Avoid having throw rugs at the top or bottom of the stairs. If you do have throw rugs, attach them to the floor with carpet tape. Make sure that you have a light switch at the top of the stairs and the bottom of the stairs. If you do not have them, ask someone to add them for you. What else can I do to help prevent falls? Wear shoes that: Do not have high heels. Have rubber bottoms. Are comfortable and fit you well. Are closed at the toe. Do not wear sandals. If  you use a stepladder: Make sure that it is fully opened. Do not climb a closed stepladder. Make sure that both sides of the stepladder are locked into place. Ask someone to hold it for you, if possible. Clearly mark and make sure that you can see: Any grab bars or handrails. First and last steps. Where the edge of each step is. Use tools that help you move around (mobility aids) if they are needed. These include: Canes. Walkers. Scooters. Crutches. Turn on the lights when you go into a dark area. Replace any light bulbs as soon as they burn out. Set up your furniture so you have a clear path. Avoid moving your furniture around. If any of your floors are uneven, fix them. If there are any pets around you, be aware of where they are. Review your medicines with your doctor. Some medicines can make you feel dizzy. This can increase your chance of falling. Ask your doctor what other things that you can do to help prevent falls. This information is not intended to replace advice given to you by your health care provider. Make sure you discuss any questions you have with your health care provider. Document Released: 02/19/2009 Document Revised: 10/01/2015 Document Reviewed: 05/30/2014 Elsevier Interactive Patient Education  2017 [employer].

## 2022-03-15 NOTE — Progress Notes (Signed)
Subjective:   Luis Reed is a 61 y.o. male who presents for Medicare Annual/Subsequent preventive examination.  Review of Systems    Virtual Visit via Telephone Note  I connected with  Luis Reed on 03/15/22 at 11:00 AM EST by telephone and verified that I am speaking with the correct person using two identifiers.  Location: Patient: Home Provider: Office Persons participating in the virtual visit: patient/Nurse Health Advisor   I discussed the limitations, risks, security and privacy concerns of performing an evaluation and management service by telephone and the availability of in person appointments. The patient expressed understanding and agreed to proceed.  Interactive audio and video telecommunications were attempted between this nurse and patient, however failed, due to patient having technical difficulties OR patient did not have access to video capability.  We continued and completed visit with audio only.  Some vital signs may be absent or patient reported.   Tillie RungBeverly W Radford Pease, LPN  Cardiac Risk Factors include: advanced age (>3455men, 15>65 women);male gender;hypertension     Objective:    Today's Vitals   03/15/22 1116  Weight: 168 lb (76.2 kg)  Height: 5\' 8"  (1.727 m)   Body mass index is 25.54 kg/m.     03/15/2022   11:21 AM 03/02/2021   10:18 AM 02/19/2020   10:17 AM 05/16/2019    5:58 AM 03/16/2018   12:18 PM 04/01/2017    8:02 AM 09/26/2015    3:55 PM  Advanced Directives  Does Patient Have a Medical Advance Directive? No No No No No No No  Would patient like information on creating a medical advance directive? No - Patient declined No - Patient declined No - Patient declined No - Patient declined No - Patient declined No - Patient declined No - patient declined information    Current Medications (verified) Outpatient Encounter Medications as of 03/15/2022  Medication Sig   amLODipine (NORVASC) 5 MG tablet Take 5 mg by mouth daily.   calcitRIOL  (ROCALTROL) 0.5 MCG capsule Take 0.5 mcg by mouth at bedtime.   CELLCEPT 250 MG capsule Take 250 mg by mouth 2 (two) times daily.   dolutegravir (TIVICAY) 50 MG tablet TAKE 1 TABLET (50 MG TOTAL) BY MOUTH DAILY.   emtricitabine-tenofovir AF (DESCOVY) 200-25 MG tablet TAKE 1 TABLET BY MOUTH AT BEDTIME.   methocarbamol (ROBAXIN) 500 MG tablet Take 1 tablet (500 mg total) by mouth at bedtime as needed for muscle spasms. (Patient not taking: Reported on 03/25/2021)   predniSONE (DELTASONE) 5 MG tablet Take 5 mg by mouth every Monday, Wednesday, and Friday at 8 PM.    predniSONE (STERAPRED UNI-PAK 21 TAB) 10 MG (21) TBPK tablet Take as directed (Patient not taking: Reported on 03/02/2021)   tacrolimus (PROGRAF) 1 MG capsule Take 2 mg by mouth 2 (two) times daily.    tacrolimus (PROGRAF) 5 MG capsule Take 5 mg by mouth 2 (two) times daily.    zolpidem (AMBIEN) 10 MG tablet Take 10 mg by mouth at bedtime as needed for sleep.    Facility-Administered Encounter Medications as of 03/15/2022  Medication   betamethasone acetate-betamethasone sodium phosphate (CELESTONE) injection 3 mg    Allergies (verified) Hydrocodone   History: Past Medical History:  Diagnosis Date   Blood transfusion without reported diagnosis    Chronic insomnia 07/18/2014   Hepatitis C    HIV infection (HCC)    Hypertension    Malignant hyperthermia    Renal disorder renal failure   sees Dr.  Dierdre Highman    SEIZURE DISORDER 03/15/2006   Qualifier: History of  By: Orvan Falconer MD, [name]  - pt states last seizure 2000   Past Surgical History:  Procedure Laterality Date   AV FISTULA PLACEMENT     COLONOSCOPY  2004   LIGATION OF ARTERIOVENOUS  FISTULA Left 03/16/2018   Procedure: LIGATION OF ARTERIOVENOUS  FISTULA LEFT ARM;  Surgeon: Larina Earthly, MD;  Location: [facility] OR;  Service: Vascular;  Laterality: Left;   NEPHRECTOMY TRANSPLANTED ORGAN     Family History  Problem Relation Age of Onset   Prostate cancer Father 20    Colon cancer Neg Hx    Colon polyps Neg Hx    Esophageal cancer Neg Hx    Rectal cancer Neg Hx    Stomach cancer Neg Hx    Social History   Socioeconomic History   Marital status: Married    Spouse name: Steward Drone   Number of children: 2   Years of education: 12   Highest education level: Not on file  Occupational History   Occupation: Disabled    Comment: Kidney disease   Tobacco Use   Smoking status: Former    Packs/day: 0.30    Years: 20.00    Total pack years: 6.00    Types: Cigarettes   Smokeless tobacco: Never   Tobacco comments:    quit smoking cigaettes at age 86  Vaping Use   Vaping Use: Never used  Substance and Sexual Activity   Alcohol use: No   Drug use: No   Sexual activity: Not Currently    Partners: Female    Comment: declined condoms  Other Topics Concern   Not on file  Social History Narrative   Born and raised in [place]. Currently resides in an house with his wife. No pets. Fun: fishing.    Denies religious beliefs that would effect health care.    Caffeine none.   Social Determinants of Health   Financial Resource Strain: Low Risk  (03/15/2022)   Overall Financial Resource Strain (CARDIA)    Difficulty of Paying Living Expenses: Not hard at all  Food Insecurity: No Food Insecurity (03/15/2022)   Hunger Vital Sign    Worried About Running Out of Food in the Last Year: Never true    Ran Out of Food in the Last Year: Never true  Transportation Needs: No Transportation Needs (03/15/2022)   PRAPARE - Administrator, Civil Service (Medical): No    Lack of Transportation (Non-Medical): No  Physical Activity: Sufficiently Active (03/15/2022)   Exercise Vital Sign    Days of Exercise per Week: 3 days    Minutes of Exercise per Session: 150+ min  Stress: No Stress Concern Present (03/15/2022)   [employer] of Occupational Health - Occupational Stress Questionnaire    Feeling of Stress : Not at all  Social Connections: Moderately  Integrated (03/15/2022)   Social Connection and Isolation Panel [NHANES]    Frequency of Communication with Friends and Family: More than three times a week    Frequency of Social Gatherings with Friends and Family: More than three times a week    Attends Religious Services: More than 4 times per year    Active Member of [employer] or Organizations: Yes    Attends Engineer, structural: More than 4 times per year    Marital Status: Divorced    Tobacco Counseling Counseling given: Not Answered Tobacco comments: quit smoking cigaettes at age 43  Clinical Intake:  Pre-visit preparation completed: No  Pain : No/denies pain     BMI - recorded: 25.54 Nutritional Status: BMI 25 -29 Overweight Nutritional Risks: None Diabetes: No  How often do you need to have someone help you when you read instructions, pamphlets, or other written materials from your doctor or pharmacy?: 1 - Never  Diabetic? No  Interpreter Needed?: No  Information entered by :: Theresa Mulligan LPN   Activities of Daily Living    03/15/2022   11:20 AM  In your present state of health, do you have any difficulty performing the following activities:  Hearing? 0  Vision? 0  Difficulty concentrating or making decisions? 0  Walking or climbing stairs? 0  Dressing or bathing? 0  Doing errands, shopping? 0  Preparing Food and eating ? N  Using the Toilet? N  In the past six months, have you accidently leaked urine? N  Do you have problems with loss of bowel control? N  Managing your Medications? N  Managing your Finances? N  Housekeeping or managing your Housekeeping? N    Patient Care Team: Wynn Banker, MD (Inactive) as PCP - General (Family Medicine) Cliffton Asters, MD as Consulting Physician (Infectious Diseases) Terrial Rhodes, MD as Consulting Physician (Nephrology)  Indicate any recent Medical Services you may have received from other than [facility] providers in the past year (date may be  approximate).     Assessment:   This is a routine wellness examination for Gleb.  Hearing/Vision screen Hearing Screening - Comments:: Denies hearing difficulties   Vision Screening - Comments:: Wears rx glasses - up to date with routine eye exams with  [facility]  Dietary issues and exercise activities discussed: Exercise limited by: None identified   Goals Addressed               This Visit's Progress     No current goals (pt-stated)         Depression Screen    03/15/2022   11:19 AM 03/25/2021    9:06 AM 03/02/2021   10:17 AM 02/19/2020   10:15 AM 02/12/2019   10:43 AM 02/01/2019    8:12 AM 11/21/2017    9:30 AM  PHQ 2/9 Scores  PHQ - 2 Score 0 0 0 0 0 0 0    Fall Risk    03/15/2022   11:21 AM 03/25/2021    9:06 AM 03/02/2021   10:19 AM 02/19/2020   10:18 AM 02/12/2019   10:43 AM  Fall Risk   Falls in the past year? 0 0  0 0  Number falls in past yr: 0  0 0 0  Injury with Fall? 0  0 0 0  Risk for fall due to : No Fall Risks  Impaired vision Impaired vision   Follow up Falls prevention discussed  Falls prevention discussed Falls prevention discussed     FALL RISK PREVENTION PERTAINING TO THE HOME:  Any stairs in or around the home? No  If so, are there any without handrails? No  Home free of loose throw rugs in walkways, pet beds, electrical cords, etc? Yes  Adequate lighting in your home to reduce risk of falls? Yes   ASSISTIVE DEVICES UTILIZED TO PREVENT FALLS:  Life alert? No  Use of a cane, walker or w/c? No  Grab bars in the bathroom? Yes  Shower chair or bench in shower? No  Elevated toilet seat or a handicapped toilet? Yes   TIMED UP AND GO:  Was the test performed? No . Audio Visit  Cognitive Function:        03/15/2022   11:21 AM 03/02/2021   10:20 AM 02/19/2020   10:21 AM  6CIT Screen  What Year? 0 points 0 points 0 points  What month? 0 points 0 points 0 points  What time? 0 points 0 points   Count back from 20 0 points 0  points 0 points  Months in reverse 2 points 4 points 4 points  Repeat phrase 0 points 6 points 10 points  Total Score 2 points 10 points     Immunizations Immunization History  Administered Date(s) Administered   Influenza Split 02/10/2011, 01/08/2012   Influenza Whole 02/06/2005, 03/14/2006, 02/09/2009, 02/09/2010   Influenza,inj,Quad PF,6+ Mos 01/17/2013, 01/16/2014, 04/23/2015, 02/20/2020, 03/25/2021   Influenza-Unspecified 04/08/2005, 07/26/2005, 02/07/2007, 03/10/2019   Janssen (J&J) SARS-COV-2 Vaccination 07/08/2019   Pfizer Covid-19 Vaccine Bivalent Booster 60yrs & up 03/25/2021   Pneumococcal Polysaccharide-23 07/26/2005, 10/18/2005, 02/10/2011   Pneumococcal-Unspecified 02/25/2005    TDAP status: Up to date  Flu Vaccine status: Up to date    Covid-19 vaccine status: Completed vaccines  Qualifies for Shingles Vaccine? Yes   Zostavax completed No   Shingrix Completed?: No.    Education has been provided regarding the importance of this vaccine. Patient has been advised to call insurance company to determine out of pocket expense if they have not yet received this vaccine. Advised may also receive vaccine at local pharmacy or Health Dept. Verbalized acceptance and understanding.  Screening Tests Health Maintenance  Topic Date Due   INFLUENZA VACCINE  12/07/2021   COVID-19 Vaccine (3 - Janssen risk series) 03/31/2022 (Originally 05/20/2021)   Zoster Vaccines- Shingrix (1 of 2) 06/15/2022 (Originally [birth year]/01/1978)   Fecal DNA (Cologuard)  08/12/2022   TETANUS/TDAP  11/07/2022   Medicare Annual Wellness (AWV)  03/16/2023   Hepatitis C Screening  Completed   HIV Screening  Completed   HPV VACCINES  Aged Out   COLONOSCOPY (Pts 45-50yrs Insurance coverage will need to be confirmed)  Discontinued    Health Maintenance  Health Maintenance Due  Topic Date Due   INFLUENZA VACCINE  12/07/2021      Lung Cancer Screening: (Low Dose CT Chest recommended if Age 47-80 years,  30 pack-year currently smoking OR have quit w/in 15years.) does not qualify.     Additional Screening:  Hepatitis C Screening: does qualify; Completed 12/01/16  Vision Screening: Recommended annual ophthalmology exams for early detection of glaucoma and other disorders of the eye. Is the patient up to date with their annual eye exam?  Yes  Who is the provider or what is the name of the office in which the patient attends annual eye exams? [facility] If pt is not established with a provider, would they like to be referred to a provider to establish care? No .   Dental Screening: Recommended annual dental exams for proper oral hygiene  Community Resource Referral / Chronic Care Management:  CRR required this visit?  No   CCM required this visit?  No      Plan:     I have personally reviewed and noted the following in the patient's chart:   Medical and social history Use of alcohol, tobacco or illicit drugs  Current medications and supplements including opioid prescriptions. Patient is not currently taking opioid prescriptions. Functional ability and status Nutritional status Physical activity Advanced directives List of other physicians Hospitalizations, surgeries, and ER visits in previous 12 months Vitals  Screenings to include cognitive, depression, and falls Referrals and appointments  In addition, I have reviewed and discussed with patient certain preventive protocols, quality metrics, and best practice recommendations. A written personalized care plan for preventive services as well as general preventive health recommendations were provided to patient.     Tillie Rung, LPN   00/11/6224   Nurse Notes: None

## 2022-03-17 ENCOUNTER — Other Ambulatory Visit (HOSPITAL_COMMUNITY): Payer: Self-pay

## 2022-03-18 ENCOUNTER — Encounter: Payer: Medicare HMO | Admitting: Family Medicine

## 2022-03-21 DIAGNOSIS — Z940 Kidney transplant status: Secondary | ICD-10-CM | POA: Diagnosis not present

## 2022-03-21 DIAGNOSIS — N189 Chronic kidney disease, unspecified: Secondary | ICD-10-CM | POA: Diagnosis not present

## 2022-04-06 ENCOUNTER — Encounter: Payer: Self-pay | Admitting: *Deleted

## 2022-04-06 ENCOUNTER — Telehealth: Payer: Self-pay | Admitting: *Deleted

## 2022-04-06 NOTE — Patient Outreach (Signed)
  Care Coordination   Initial Visit Note   04/06/2022 Name: Luis Reed MRN: [id number] DOB: 1960-12-27  Luis Reed is a 61 y.o. year old male who sees Koberlein, Paris Lore, MD (Inactive) for primary care. I spoke with  Luis Reed by phone today.  What matters to the patients health and wellness today?  Living Will    Goals Addressed               This Visit's Progress     COMPLETED: Living Will (pt-stated)        Care Coordination Interventions: Provided education to patient and/or caregiver about advanced directives Reviewed medications with patient and discussed adherence with no needed refills Reviewed scheduled/upcoming provider appointments including sufficient transportation source for all appointments Screening for signs and symptoms of depression related to chronic disease state  Assessed social determinant of health barriers Will mail out a Advance Directive packet to the address on file and add information the the AVS concerning living wills.         SDOH assessments and interventions completed:  Yes  SDOH Interventions Today    Flowsheet Row Most Recent Value  SDOH Interventions   Food Insecurity Interventions Intervention Not Indicated  Housing Interventions Intervention Not Indicated  Transportation Interventions Intervention Not Indicated  Utilities Interventions Intervention Not Indicated        Care Coordination Interventions:  Yes, provided   Follow up plan: No further intervention required.   Encounter Outcome:  Pt. Visit Completed   Luis Cousin, RN Care Management Coordinator [facility] [employer] Main Office [phone]

## 2022-04-06 NOTE — Patient Instructions (Signed)
Visit Information  Thank you for taking time to visit with me today. Please don't hesitate to contact me if I can be of assistance to you.   Following are the goals we discussed today:   Goals Addressed               This Visit's Progress     COMPLETED: Living Will (pt-stated)        Care Coordination Interventions: Provided education to patient and/or caregiver about advanced directives Reviewed medications with patient and discussed adherence with no needed refills Reviewed scheduled/upcoming provider appointments including sufficient transportation source for all appointments Screening for signs and symptoms of depression related to chronic disease state  Assessed social determinant of health barriers Will mail out a Advance Directive packet to the address on file and add information the the AVS concerning living wills.         Please call the care guide team at [phone] if you need to cancel or reschedule your appointment.   If you are experiencing a Mental Health or Behavioral Health Crisis or need someone to talk to, please call the Suicide and Crisis Lifeline: 988 call the [place] National Suicide Prevention Lifeline: 2157725636 or TTY: 813-049-5764 TTY 878-149-5589) to talk to a trained counselor call 1-800-273-TALK (toll free, 24 hour hotline)  Patient verbalizes understanding of instructions and care plan provided today and agrees to view in MyChart. Active MyChart status and patient understanding of how to access instructions and care plan via MyChart confirmed with patient.     No further follow up required: No further follow up needed  Elliot Cousin, RN Care Management Coordinator [facility] [facility] Main Office [phone]

## 2022-04-08 ENCOUNTER — Other Ambulatory Visit: Payer: Self-pay | Admitting: Internal Medicine

## 2022-04-08 ENCOUNTER — Other Ambulatory Visit (HOSPITAL_COMMUNITY): Payer: Self-pay

## 2022-04-08 DIAGNOSIS — B20 Human immunodeficiency virus [HIV] disease: Secondary | ICD-10-CM

## 2022-04-11 ENCOUNTER — Other Ambulatory Visit (HOSPITAL_COMMUNITY): Payer: Self-pay

## 2022-04-12 ENCOUNTER — Other Ambulatory Visit: Payer: Self-pay

## 2022-04-12 ENCOUNTER — Other Ambulatory Visit (HOSPITAL_COMMUNITY)
Admission: RE | Admit: 2022-04-12 | Discharge: 2022-04-12 | Disposition: A | Discharge status: Home / Self Care | Payer: Medicare HMO | Source: Ambulatory Visit | Attending: Internal Medicine | Admitting: Internal Medicine

## 2022-04-12 ENCOUNTER — Other Ambulatory Visit: Payer: Medicare HMO

## 2022-04-12 DIAGNOSIS — B20 Human immunodeficiency virus [HIV] disease: Secondary | ICD-10-CM

## 2022-04-12 DIAGNOSIS — Z79899 Other long term (current) drug therapy: Secondary | ICD-10-CM

## 2022-04-12 DIAGNOSIS — Z113 Encounter for screening for infections with a predominantly sexual mode of transmission: Secondary | ICD-10-CM | POA: Insufficient documentation

## 2022-04-13 LAB — T-HELPER CELL (CD4) - (RCID CLINIC ONLY)
CD4 % Helper T Cell: 36 % (ref 33–65)
CD4 T Cell Abs: 793 /uL (ref 400–1790)

## 2022-04-13 LAB — URINE CYTOLOGY ANCILLARY ONLY
Chlamydia: NEGATIVE
Comment: NEGATIVE
Comment: NORMAL
Neisseria Gonorrhea: NEGATIVE

## 2022-04-14 ENCOUNTER — Other Ambulatory Visit: Payer: Medicare HMO

## 2022-04-14 LAB — COMPLETE METABOLIC PANEL WITH GFR
AG Ratio: 1.3 (calc) (ref 1.0–2.5)
ALT: 7 U/L — ABNORMAL LOW (ref 9–46)
AST: 13 U/L (ref 10–35)
Albumin: 4.1 g/dL (ref 3.6–5.1)
Alkaline phosphatase (APISO): 65 U/L (ref 35–144)
BUN/Creatinine Ratio: 11 (calc) (ref 6–22)
BUN: 16 mg/dL (ref 7–25)
CO2: 27 mmol/L (ref 20–32)
Calcium: 7.1 mg/dL — ABNORMAL LOW (ref 8.6–10.3)
Chloride: 104 mmol/L (ref 98–110)
Creat: 1.48 mg/dL — ABNORMAL HIGH (ref 0.70–1.35)
Globulin: 3.2 g/dL (calc) (ref 1.9–3.7)
Glucose, Bld: 77 mg/dL (ref 65–99)
Potassium: 3.8 mmol/L (ref 3.5–5.3)
Sodium: 141 mmol/L (ref 135–146)
Total Bilirubin: 0.5 mg/dL (ref 0.2–1.2)
Total Protein: 7.3 g/dL (ref 6.1–8.1)
eGFR: 53 mL/min/{1.73_m2} — ABNORMAL LOW (ref >=60)

## 2022-04-14 LAB — CBC WITH DIFFERENTIAL/PLATELET
Absolute Monocytes: 478 cells/uL (ref 200–950)
Basophils Absolute: 31 cells/uL (ref 0–200)
Basophils Relative: 0.6 %
Eosinophils Absolute: 68 cells/uL (ref 15–500)
Eosinophils Relative: 1.3 %
HCT: 41 % (ref 38.5–50.0)
Hemoglobin: 14.2 g/dL (ref 13.2–17.1)
Lymphs Abs: 2350 cells/uL (ref 850–3900)
MCH: 33.4 pg — ABNORMAL HIGH (ref 27.0–33.0)
MCHC: 34.6 g/dL (ref 32.0–36.0)
MCV: 96.5 fL (ref 80.0–100.0)
MPV: 9.8 fL (ref 7.5–12.5)
Monocytes Relative: 9.2 %
Neutro Abs: 2272 cells/uL (ref 1500–7800)
Neutrophils Relative %: 43.7 %
Platelets: 223 10*3/uL (ref 140–400)
RBC: 4.25 10*6/uL (ref 4.20–5.80)
RDW: 12.4 % (ref 11.0–15.0)
Total Lymphocyte: 45.2 %
WBC: 5.2 10*3/uL (ref 3.8–10.8)

## 2022-04-14 LAB — LIPID PANEL
Cholesterol: 123 mg/dL (ref <200)
HDL: 64 mg/dL (ref >=40)
LDL Cholesterol (Calc): 38 mg/dL (calc)
Non-HDL Cholesterol (Calc): 59 mg/dL (calc) (ref <130)
Total CHOL/HDL Ratio: 1.9 (calc) (ref <5.0)
Triglycerides: 131 mg/dL (ref <150)

## 2022-04-14 LAB — RPR: RPR Ser Ql: NONREACTIVE

## 2022-04-14 LAB — HIV-1 RNA QUANT-NO REFLEX-BLD
HIV 1 RNA Quant: NOT DETECTED Copies/mL
HIV-1 RNA Quant, Log: NOT DETECTED Log cps/mL

## 2022-04-21 DIAGNOSIS — B1920 Unspecified viral hepatitis C without hepatic coma: Secondary | ICD-10-CM | POA: Diagnosis not present

## 2022-04-21 DIAGNOSIS — B20 Human immunodeficiency virus [HIV] disease: Secondary | ICD-10-CM | POA: Diagnosis not present

## 2022-04-21 DIAGNOSIS — T8611 Kidney transplant rejection: Secondary | ICD-10-CM | POA: Diagnosis not present

## 2022-04-21 DIAGNOSIS — N2581 Secondary hyperparathyroidism of renal origin: Secondary | ICD-10-CM | POA: Diagnosis not present

## 2022-04-21 DIAGNOSIS — I10 Essential (primary) hypertension: Secondary | ICD-10-CM | POA: Diagnosis not present

## 2022-04-21 DIAGNOSIS — Z940 Kidney transplant status: Secondary | ICD-10-CM | POA: Diagnosis not present

## 2022-04-21 DIAGNOSIS — R8279 Other abnormal findings on microbiological examination of urine: Secondary | ICD-10-CM | POA: Diagnosis not present

## 2022-04-21 DIAGNOSIS — Z9225 Personal history of immunosupression therapy: Secondary | ICD-10-CM | POA: Diagnosis not present

## 2022-04-28 ENCOUNTER — Encounter: Payer: Self-pay | Admitting: Internal Medicine

## 2022-04-28 ENCOUNTER — Ambulatory Visit (INDEPENDENT_AMBULATORY_CARE_PROVIDER_SITE_OTHER): Payer: Medicare HMO | Admitting: Internal Medicine

## 2022-04-28 ENCOUNTER — Other Ambulatory Visit: Payer: Self-pay

## 2022-04-28 DIAGNOSIS — B20 Human immunodeficiency virus [HIV] disease: Secondary | ICD-10-CM

## 2022-04-28 MED ORDER — TIVICAY 50 MG PO TABS: ORAL_TABLET | Freq: Every day | ORAL | 11 refills | Status: DC

## 2022-04-28 MED ORDER — DESCOVY 200-25 MG PO TABS: 1.0000 | ORAL_TABLET | Freq: Every day | ORAL | 11 refills | Status: DC

## 2022-04-28 NOTE — Progress Notes (Signed)
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   CALCITRIOL (ROCALTROL) 0.5 MCG CAPSULE    Take 0.5 mcg by mouth at bedtime.   CELLCEPT 250 MG CAPSULE    Take 250 mg by mouth 2 (two) times daily.   METHOCARBAMOL (ROBAXIN) 500 MG TABLET    Take 1 tablet (500 mg total) by mouth at bedtime as needed for muscle spasms.   PREDNISONE (DELTASONE) 5 MG TABLET    Take 5 mg by mouth every Monday, Wednesday, and Friday at 8 PM.    PREDNISONE (STERAPRED UNI-PAK 21 TAB) 10 MG (21) TBPK TABLET    Take as directed   TACROLIMUS (PROGRAF) 1 MG CAPSULE    Take 2 mg by mouth 2 (two) times daily.    TACROLIMUS (PROGRAF) 5 MG CAPSULE    Take 5 mg by mouth 2 (two) times daily.    ZOLPIDEM (AMBIEN) 10 MG TABLET    Take 10 mg by mouth at bedtime as needed for sleep.   Modified Medications   Modified Medication Previous Medication   DOLUTEGRAVIR (TIVICAY) 50 MG TABLET dolutegravir (TIVICAY) 50 MG tablet      TAKE 1 TABLET (50 MG TOTAL) BY MOUTH DAILY.    TAKE 1 TABLET (50 MG TOTAL) BY MOUTH DAILY.    EMTRICITABINE-TENOFOVIR AF (DESCOVY) 200-25 MG TABLET emtricitabine-tenofovir AF (DESCOVY) 200-25 MG tablet      TAKE 1 TABLET BY MOUTH AT BEDTIME.    TAKE 1 TABLET BY MOUTH AT BEDTIME.  Discontinued Medications   No medications on file    Subjective: Luis Reed is in for his routine HIV follow-up visit.  He denies any problems obtaining, taking or tolerating his Descovy or Tivicay.  He takes it each evening and does not recall missing doses.  He is not on any new medications since his last visit.  He has had his annual influenza vaccine and an updated COVID-vaccine.  He is feeling well.  Review of Systems: Review of Systems  Constitutional:  Negative for fever and weight loss.  Psychiatric/Behavioral:  Negative for depression.     Past Medical History:  Diagnosis Date   Blood transfusion without reported diagnosis    Chronic insomnia 07/18/2014   Hepatitis C    HIV infection (HCC)    Hypertension    Malignant hyperthermia    Renal disorder renal failure   sees Dr. Dierdre Highman    SEIZURE DISORDER 03/15/2006   Qualifier: History of  By: Orvan Falconer MD, [place]  - pt states last seizure 2000  Social History   Tobacco Use   Smoking status: Former    Packs/day: 0.30    Years: 20.00    Total pack years: 6.00    Types: Cigarettes   Smokeless tobacco: Never   Tobacco comments:    quit smoking cigaettes at age 87  Vaping Use   Vaping Use: Never used  Substance Use Topics   Alcohol use: No   Drug use: No    Family History  Problem Relation Age of Onset   Prostate cancer Father 53   Colon cancer Neg Hx    Colon polyps Neg Hx    Esophageal cancer Neg Hx    Rectal cancer Neg Hx    Stomach cancer Neg Hx     Allergies  Allergen Reactions   Hydrocodone Itching and Other (See Comments)    fidgety    Health Maintenance  Topic Date Due   DTaP/Tdap/Td (1 - Tdap) Never done   INFLUENZA VACCINE  12/07/2021   COVID-19 Vaccine (3 - 2023-24 season) 01/07/2022   Zoster  Vaccines- Shingrix (1 of 2) 06/15/2022 (Originally 03/01/1978)   Fecal DNA (Cologuard)  08/12/2022   Medicare Annual Wellness (AWV)  03/16/2023   Hepatitis C Screening  Completed   HIV Screening  Completed   HPV VACCINES  Aged Out   COLONOSCOPY (Pts 45-68yrs Insurance coverage will need to be confirmed)  Discontinued    Objective:  Vitals:   04/28/22 0918  BP: (!) 153/89  Pulse: 62  Resp: 16  Temp: 98.2 F (36.8 C)  TempSrc: Oral  SpO2: 97%  Weight: 176 lb (79.8 kg)  Height: 5\' 8"  (1.727 m)   Body mass index is 26.76 kg/m.  Physical Exam Constitutional:      Comments: He is quiet but in good spirits as usual.  Cardiovascular:     Rate and Rhythm: Normal rate.  Pulmonary:     Effort: Pulmonary effort is normal.  Psychiatric:        Mood and Affect: Mood normal.     Lab Results Lab Results  Component Value Date   WBC 5.2 04/12/2022   HGB 14.2 04/12/2022   HCT 41.0 04/12/2022   MCV 96.5 04/12/2022   PLT 223 04/12/2022    Lab Results  Component Value Date   CREATININE 1.48 (H) 04/12/2022   BUN 16 04/12/2022   NA 141 04/12/2022   K 3.8 04/12/2022   CL 104 04/12/2022   CO2 27 04/12/2022    Lab Results  Component Value Date   ALT 7 (L) 04/12/2022   AST 13 04/12/2022   GGT 38 07/14/2014   ALKPHOS 55 08/12/2017   BILITOT 0.5 04/12/2022    Lab Results  Component Value Date   CHOL 123 04/12/2022   HDL 64 04/12/2022   LDLCALC 38 04/12/2022   TRIG 131 04/12/2022   CHOLHDL 1.9 04/12/2022   Lab Results  Component Value Date   LABRPR NON-REACTIVE 04/12/2022   HIV 1 RNA Quant (Copies/mL)  Date Value  04/12/2022 Not Detected  03/10/2021 Not Detected  01/29/2020 <20   CD4 T Cell Abs (/uL)  Date Value  04/12/2022 793  03/10/2021 405  01/29/2020 692     Problem List Items Addressed This Visit       High   Human immunodeficiency virus (HIV) disease (HCC)    His infection remains under excellent, long-term control.  He will continue Descovy and  Tivicay and follow-up after lab work in 1 year.  Relevant Medications   dolutegravir (TIVICAY) 50 MG tablet   emtricitabine-tenofovir AF (DESCOVY) 200-25 MG tablet   Other Relevant Orders   CBC   T-helper cells (CD4) count (not at [facility])   Comprehensive metabolic panel   Lipid panel   RPR   HIV-1 RNA quant-no reflex-bld      Cliffton Asters, MD [facility] for Infectious Disease [facility] [phone] pager   [phone] cell 04/28/2022, 9:31 AM

## 2022-04-28 NOTE — Assessment & Plan Note (Signed)
His infection remains under excellent, long-term control.  He will continue Descovy and Tivicay and follow-up after lab work in 1 year. 

## 2022-05-20 ENCOUNTER — Other Ambulatory Visit (HOSPITAL_COMMUNITY): Payer: Self-pay

## 2022-05-31 ENCOUNTER — Other Ambulatory Visit (HOSPITAL_COMMUNITY): Payer: Self-pay

## 2022-05-31 ENCOUNTER — Other Ambulatory Visit: Payer: Self-pay

## 2022-05-31 DIAGNOSIS — B20 Human immunodeficiency virus [HIV] disease: Secondary | ICD-10-CM

## 2022-05-31 MED ORDER — TIVICAY 50 MG PO TABS
ORAL_TABLET | Freq: Every day | ORAL | 11 refills | Status: AC
  Filled 2022-05-31: qty 30, fill #0
  Filled 2022-06-01 – 2022-06-02 (×2): qty 30, 30d supply, fill #0
  Filled 2022-06-23: qty 30, 30d supply, fill #1
  Filled 2022-07-20: qty 30, 30d supply, fill #2
  Filled 2022-08-22: qty 30, 30d supply, fill #3
  Filled 2022-09-20: qty 30, 30d supply, fill #4
  Filled 2022-10-14: qty 30, 30d supply, fill #5
  Filled 2022-11-28: qty 30, 30d supply, fill #6
  Filled 2022-12-29: qty 30, 30d supply, fill #7
  Filled 2023-02-20: qty 30, 30d supply, fill #8
  Filled 2023-03-09: qty 30, 30d supply, fill #9
  Filled 2023-04-17: qty 30, 30d supply, fill #10
  Filled 2023-05-19: qty 30, 30d supply, fill #11

## 2022-05-31 MED ORDER — DESCOVY 200-25 MG PO TABS
1.0000 | ORAL_TABLET | Freq: Every day | ORAL | 11 refills | Status: AC
  Filled 2022-05-31 – 2022-06-02 (×3): qty 30, 30d supply, fill #0
  Filled 2022-06-23: qty 30, 30d supply, fill #1
  Filled 2022-07-20: qty 30, 30d supply, fill #2
  Filled 2022-08-22: qty 30, 30d supply, fill #3
  Filled 2022-09-20: qty 30, 30d supply, fill #4
  Filled 2022-10-14: qty 30, 30d supply, fill #5
  Filled 2022-11-28: qty 30, 30d supply, fill #6
  Filled 2022-12-29: qty 30, 30d supply, fill #7
  Filled 2023-02-20: qty 30, 30d supply, fill #8
  Filled 2023-03-09: qty 30, 30d supply, fill #9
  Filled 2023-04-17: qty 30, 30d supply, fill #10
  Filled 2023-05-19: qty 30, 30d supply, fill #11

## 2022-06-01 ENCOUNTER — Other Ambulatory Visit (HOSPITAL_COMMUNITY): Payer: Self-pay

## 2022-06-02 ENCOUNTER — Telehealth: Payer: Self-pay

## 2022-06-02 ENCOUNTER — Other Ambulatory Visit (HOSPITAL_COMMUNITY): Payer: Self-pay

## 2022-06-02 NOTE — Telephone Encounter (Signed)
[facility] Patient Advocate Encounter   I was successful in securing patient a $ 10,000.00 grant from [employer] to provide copayment coverage for Luis Reed.  The patient's out of pocket cost will be $0.00 monthly.     I have spoken with the patient.    The billing information is as follows and has been shared with [facility].         Dates of Eligibility: 06/02/22 through 05/09/23  Patient knows to call the office with questions or concerns.  Luis Reed, [facility] Patient [facility] for Infectious Disease Phone: [phone] Fax:  [phone]

## 2022-06-13 ENCOUNTER — Encounter: Payer: Self-pay | Admitting: Family Medicine

## 2022-06-13 ENCOUNTER — Ambulatory Visit (INDEPENDENT_AMBULATORY_CARE_PROVIDER_SITE_OTHER): Payer: Medicare HMO | Admitting: Family Medicine

## 2022-06-13 VITALS — BP 118/80 | HR 65 | Temp 98.2°F | Ht 68.0 in | Wt 168.3 lb

## 2022-06-13 DIAGNOSIS — Z1211 Encounter for screening for malignant neoplasm of colon: Secondary | ICD-10-CM | POA: Diagnosis not present

## 2022-06-13 DIAGNOSIS — I10 Essential (primary) hypertension: Secondary | ICD-10-CM

## 2022-06-13 MED ORDER — AMLODIPINE BESYLATE 5 MG PO TABS: 5.0000 mg | ORAL_TABLET | Freq: Every day | ORAL | 3 refills | Status: DC

## 2022-06-13 NOTE — Progress Notes (Unsigned)
Established Patient Office Visit  Subjective   Patient ID: Luis Reed, male    DOB: 02-15-1961  Age: 62 y.o. MRN: [id number]  Chief Complaint  Patient presents with   Establish Care    Pt is here for transition of care visit. He was last seen by Dr. Ethlyn Gallery in May 2023. He reports that he is feeling well. He is a kidney transplant and he reports he continues to follow with the transplant physicians. We reviewed his medications ans her reports compliance with his anti-rejection medications.   HTN -- BP in office performed and is well controlled. He  reports no side effects to the medications, no chest pain, SOB, dizziness or headaches. He has a BP cuff at home and is checking BP regularly, reports they are in the normal range.         Current Outpatient Medications  Medication Instructions   amLODipine (NORVASC) 5 mg, Oral, Daily   calcitRIOL (ROCALTROL) 0.5 mcg, Oral, Daily at bedtime   CellCept 250 mg, Oral, 2 times daily   dolutegravir (TIVICAY) 50 MG tablet TAKE 1 TABLET (50 MG TOTAL) BY MOUTH DAILY.   emtricitabine-tenofovir AF (DESCOVY) 200-25 MG tablet TAKE 1 TABLET BY MOUTH AT BEDTIME.   methocarbamol (ROBAXIN) 500 mg, Oral, At bedtime PRN   predniSONE (DELTASONE) 5 mg, Oral, Every M-W-F (2000)   predniSONE (STERAPRED UNI-PAK 21 TAB) 10 MG (21) TBPK tablet Take as directed   tacrolimus (PROGRAF) 5 mg, Oral, 2 times daily   tacrolimus (PROGRAF) 2 mg, Oral, 2 times daily   zolpidem (AMBIEN) 10 mg, Oral, At bedtime PRN    Patient Active Problem List   Diagnosis Date Noted   COVID-19 virus infection 05/20/2019   Malignant hyperthermia    Other specified anxiety disorders 07/18/2014   RA (retrograde amnesia) 07/18/2014   TBI (traumatic brain injury) ([place]) 07/18/2014   Post concussive syndrome 05/13/2014   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     Objective:     BP 118/80 (BP Location: Right Arm, Patient Position: Sitting, Cuff Size: Large)   Pulse 65   Temp 98.2 F (36.8 C) (Oral)   Ht 5\' 8"  (1.727 m)   Wt 168 lb 4.8 oz (76.3 kg)   SpO2 98%   BMI 25.59 kg/m    Physical Exam Vitals reviewed.  Constitutional:      Appearance: Normal appearance. He is well-groomed and normal weight.  Eyes:     Extraocular Movements: Extraocular movements intact.     Conjunctiva/sclera: Conjunctivae normal.  Neck:     Thyroid: No thyromegaly.  Cardiovascular:     Rate and Rhythm: Normal rate and regular rhythm.     Heart sounds: S1 normal and S2 normal. No murmur heard. Pulmonary:     Effort: Pulmonary effort is normal.     Breath sounds: Normal breath sounds and air entry. No rales.  Abdominal:     General: Abdomen is flat. Bowel sounds are normal.  Musculoskeletal:  Right lower leg: No edema.     Left lower leg: No edema.  Neurological:     General: No focal deficit present.     Mental Status: He is alert and oriented to person, place, and time.     Gait: Gait is intact.  Psychiatric:        Mood and Affect: Mood and affect normal.      No results found for any visits on 06/13/22.  Last metabolic panel Lab Results  Component Value Date   GLUCOSE 77 04/12/2022   NA 141 04/12/2022   K 3.8 04/12/2022   CL 104 04/12/2022   CO2 27 04/12/2022   BUN 16 04/12/2022   CREATININE 1.48 (H) 04/12/2022   EGFR 53 (L) 04/12/2022   CALCIUM 7.1 (L) 04/12/2022   PHOS 2.7 01/31/2011   PROT 7.3 04/12/2022   ALBUMIN 3.9 08/12/2017    BILITOT 0.5 04/12/2022   ALKPHOS 55 08/12/2017   AST 13 04/12/2022   ALT 7 (L) 04/12/2022   ANIONGAP 6 02/22/2021   Last lipids Lab Results  Component Value Date   CHOL 123 04/12/2022   HDL 64 04/12/2022   LDLCALC 38 04/12/2022   TRIG 131 04/12/2022   CHOLHDL 1.9 04/12/2022      The ASCVD Risk score (Arnett DK, et al., 2019) failed to calculate for the following reasons:   The valid total cholesterol range is 130 to 320 mg/dL    Assessment & Plan:   Problem List Items Addressed This Visit       Unprioritized   Essential (primary) hypertension - Primary    BP is well controlled on amlodipine 5 mg daily. Will refill this for patient today. I reviewed his most recent set of bloodwork listed above. RTC in 1 year for his annual physical      Relevant Medications   amLODipine (NORVASC) 5 MG tablet   Other Visit Diagnoses     Colon cancer screening       Relevant Orders   Cologuard       Return in about 1 year (around 06/14/2023) for annual physical exam.    Farrel Conners, MD

## 2022-06-13 NOTE — Patient Instructions (Signed)
Cologuard will be sent to your home the first week of April.

## 2022-06-14 NOTE — Assessment & Plan Note (Signed)
BP is well controlled on amlodipine 5 mg daily. Will refill this for patient today. I reviewed his most recent set of bloodwork listed above. RTC in 1 year for his annual physical

## 2022-06-22 DIAGNOSIS — Z940 Kidney transplant status: Secondary | ICD-10-CM | POA: Diagnosis not present

## 2022-06-22 DIAGNOSIS — N189 Chronic kidney disease, unspecified: Secondary | ICD-10-CM | POA: Diagnosis not present

## 2022-06-23 ENCOUNTER — Other Ambulatory Visit (HOSPITAL_COMMUNITY): Payer: Self-pay

## 2022-06-27 ENCOUNTER — Other Ambulatory Visit: Payer: Self-pay

## 2022-06-28 ENCOUNTER — Other Ambulatory Visit: Payer: Self-pay

## 2022-07-20 ENCOUNTER — Other Ambulatory Visit (HOSPITAL_COMMUNITY): Payer: Self-pay

## 2022-07-22 ENCOUNTER — Other Ambulatory Visit: Payer: Self-pay

## 2022-08-10 DIAGNOSIS — N2581 Secondary hyperparathyroidism of renal origin: Secondary | ICD-10-CM | POA: Diagnosis not present

## 2022-08-10 DIAGNOSIS — H5213 Myopia, bilateral: Secondary | ICD-10-CM | POA: Diagnosis not present

## 2022-08-10 DIAGNOSIS — Z940 Kidney transplant status: Secondary | ICD-10-CM | POA: Diagnosis not present

## 2022-08-10 DIAGNOSIS — Z9225 Personal history of immunosupression therapy: Secondary | ICD-10-CM | POA: Diagnosis not present

## 2022-08-10 DIAGNOSIS — B1920 Unspecified viral hepatitis C without hepatic coma: Secondary | ICD-10-CM | POA: Diagnosis not present

## 2022-08-10 DIAGNOSIS — I10 Essential (primary) hypertension: Secondary | ICD-10-CM | POA: Diagnosis not present

## 2022-08-10 DIAGNOSIS — B20 Human immunodeficiency virus [HIV] disease: Secondary | ICD-10-CM | POA: Diagnosis not present

## 2022-08-10 DIAGNOSIS — G4700 Insomnia, unspecified: Secondary | ICD-10-CM | POA: Diagnosis not present

## 2022-08-10 DIAGNOSIS — T8611 Kidney transplant rejection: Secondary | ICD-10-CM | POA: Diagnosis not present

## 2022-08-15 DIAGNOSIS — H524 Presbyopia: Secondary | ICD-10-CM | POA: Diagnosis not present

## 2022-08-17 DIAGNOSIS — Z940 Kidney transplant status: Secondary | ICD-10-CM | POA: Diagnosis not present

## 2022-08-18 ENCOUNTER — Other Ambulatory Visit (HOSPITAL_COMMUNITY): Payer: Self-pay

## 2022-08-22 ENCOUNTER — Other Ambulatory Visit (HOSPITAL_COMMUNITY): Payer: Self-pay

## 2022-08-24 ENCOUNTER — Other Ambulatory Visit: Payer: Self-pay

## 2022-09-20 ENCOUNTER — Other Ambulatory Visit (HOSPITAL_COMMUNITY): Payer: Self-pay

## 2022-09-21 ENCOUNTER — Other Ambulatory Visit (HOSPITAL_COMMUNITY): Payer: Self-pay

## 2022-09-30 DIAGNOSIS — N189 Chronic kidney disease, unspecified: Secondary | ICD-10-CM | POA: Diagnosis not present

## 2022-09-30 DIAGNOSIS — Z940 Kidney transplant status: Secondary | ICD-10-CM | POA: Diagnosis not present

## 2022-10-14 ENCOUNTER — Other Ambulatory Visit (HOSPITAL_COMMUNITY): Payer: Self-pay

## 2022-11-08 ENCOUNTER — Other Ambulatory Visit: Payer: Self-pay

## 2022-11-11 ENCOUNTER — Other Ambulatory Visit (HOSPITAL_COMMUNITY): Payer: Self-pay

## 2022-11-14 ENCOUNTER — Other Ambulatory Visit (HOSPITAL_COMMUNITY): Payer: Self-pay

## 2022-11-16 ENCOUNTER — Other Ambulatory Visit: Payer: Self-pay

## 2022-11-28 ENCOUNTER — Other Ambulatory Visit (HOSPITAL_COMMUNITY): Payer: Self-pay

## 2022-11-28 ENCOUNTER — Other Ambulatory Visit: Payer: Self-pay

## 2022-11-30 ENCOUNTER — Other Ambulatory Visit (HOSPITAL_COMMUNITY): Payer: Self-pay

## 2022-12-06 ENCOUNTER — Ambulatory Visit: Payer: Medicare HMO | Admitting: Podiatry

## 2022-12-06 ENCOUNTER — Encounter: Payer: Self-pay | Admitting: Podiatry

## 2022-12-06 DIAGNOSIS — B351 Tinea unguium: Secondary | ICD-10-CM | POA: Diagnosis not present

## 2022-12-07 ENCOUNTER — Encounter: Payer: Self-pay | Admitting: Podiatry

## 2022-12-07 NOTE — Progress Notes (Signed)
  Subjective:  Patient ID: Luis Reed, male    DOB: 1960-12-07,  MRN: [id number]  Chief Complaint  Patient presents with   Nail Problem    "I need you to look at this toe.  It's dark and stays sore all the time" N - painful toenail L - hallux left D - 6-7 mos O - suddenly, gradually worse C - sore, discolored A - shoes, pressure T - none    62 y.o. male presents with the above complaint. History confirmed with patient.   Objective:  Physical Exam: warm, good capillary refill, no trophic changes or ulcerative lesions, normal DP and PT pulses, normal sensory exam, and severe left hallux onychomycosis with onycholysis and dystrophy   Assessment:   1. Onychomycosis      Plan:  Patient was evaluated and treated and all questions answered.  We discussed treatment of the onychomycosis and severe nail dystrophy that is present.  We discussed medical therapy.  After reviewing his medical record and notes from his nephrologist I expect that likely terbinafine or fluconazole would be of minimal value and potentially offer some risk to his kidney and liver.  Likely to interact with his antiretrovirals as well.  He will be scheduled for follow-up for permanent matricectomy of the entire nailbed.  Return if symptoms worsen or fail to improve.

## 2022-12-14 DIAGNOSIS — Z940 Kidney transplant status: Secondary | ICD-10-CM | POA: Diagnosis not present

## 2022-12-14 DIAGNOSIS — N189 Chronic kidney disease, unspecified: Secondary | ICD-10-CM | POA: Diagnosis not present

## 2022-12-16 DIAGNOSIS — Z940 Kidney transplant status: Secondary | ICD-10-CM | POA: Diagnosis not present

## 2022-12-16 DIAGNOSIS — N2581 Secondary hyperparathyroidism of renal origin: Secondary | ICD-10-CM | POA: Diagnosis not present

## 2022-12-16 DIAGNOSIS — T8611 Kidney transplant rejection: Secondary | ICD-10-CM | POA: Diagnosis not present

## 2022-12-16 DIAGNOSIS — I10 Essential (primary) hypertension: Secondary | ICD-10-CM | POA: Diagnosis not present

## 2022-12-16 DIAGNOSIS — M25511 Pain in right shoulder: Secondary | ICD-10-CM | POA: Diagnosis not present

## 2022-12-16 DIAGNOSIS — R8279 Other abnormal findings on microbiological examination of urine: Secondary | ICD-10-CM | POA: Diagnosis not present

## 2022-12-16 DIAGNOSIS — Z9225 Personal history of immunosupression therapy: Secondary | ICD-10-CM | POA: Diagnosis not present

## 2022-12-16 DIAGNOSIS — B20 Human immunodeficiency virus [HIV] disease: Secondary | ICD-10-CM | POA: Diagnosis not present

## 2022-12-29 ENCOUNTER — Other Ambulatory Visit (HOSPITAL_COMMUNITY): Payer: Self-pay

## 2022-12-30 ENCOUNTER — Other Ambulatory Visit (HOSPITAL_COMMUNITY): Payer: Self-pay

## 2023-01-20 ENCOUNTER — Other Ambulatory Visit (HOSPITAL_COMMUNITY): Payer: Self-pay

## 2023-01-23 ENCOUNTER — Other Ambulatory Visit (HOSPITAL_COMMUNITY): Payer: Self-pay

## 2023-01-25 ENCOUNTER — Other Ambulatory Visit (HOSPITAL_COMMUNITY): Payer: Self-pay

## 2023-02-08 ENCOUNTER — Other Ambulatory Visit: Payer: Self-pay

## 2023-02-08 NOTE — Progress Notes (Signed)
The ASCVD Risk score (Arnett DK, et al., 2019) failed to calculate for the following reasons:   The valid total cholesterol range is 130 to 320 mg/dL  Sandie Ano, RN

## 2023-02-20 ENCOUNTER — Other Ambulatory Visit (HOSPITAL_COMMUNITY): Payer: Self-pay

## 2023-02-20 ENCOUNTER — Other Ambulatory Visit: Payer: Self-pay

## 2023-02-20 NOTE — Progress Notes (Signed)
Specialty Pharmacy Refill Coordination Note  Luis Reed is a 62 y.o. male contacted today regarding refills of specialty medication(s) Dolutegravir Sodium; Emtricitabine-Tenofovir Af   Patient requested Delivery   Delivery date: 02/22/23   Verified address: [address] [place]  [address]   Medication will be filled on 02/21/23.

## 2023-03-09 ENCOUNTER — Other Ambulatory Visit: Payer: Self-pay

## 2023-03-09 NOTE — Progress Notes (Signed)
Specialty Pharmacy Refill Coordination Note  Luis Reed is a 62 y.o. male contacted today regarding refills of specialty medication(s) Dolutegravir Sodium; Emtricitabine-Tenofovir Af   Patient requested Delivery   Delivery date: 03/20/23   Verified address: [address] [place] [address]   Medication will be filled on 03/17/23.

## 2023-03-16 ENCOUNTER — Other Ambulatory Visit (HOSPITAL_COMMUNITY): Payer: Self-pay

## 2023-03-21 ENCOUNTER — Ambulatory Visit (INDEPENDENT_AMBULATORY_CARE_PROVIDER_SITE_OTHER): Payer: Medicare HMO | Admitting: Family Medicine

## 2023-03-21 DIAGNOSIS — Z0000 Encounter for general adult medical examination without abnormal findings: Secondary | ICD-10-CM | POA: Diagnosis not present

## 2023-03-21 NOTE — Progress Notes (Signed)
PATIENT CHECK-IN and HEALTH RISK ASSESSMENT QUESTIONNAIRE:  -completed by phone/video for upcoming Medicare Preventive Visit  PLEASE CHECK FIRST UNDER ROOMING TAB, then QUESTIONNAIRE to see if patient completed online questions. If so use those to complete some of these question ahead of time.   Pre-Visit Check-in: 1)Vitals (height, wt, BP, etc) - record in vitals section for visit on day of visit Request home vitals (wt, BP, etc.) and enter into vitals, THEN update Vital Signs SmartPhrase below at the top of the HPI. See below.  2)Review and Update Medications, Allergies PMH, Surgeries, Social history in Epic 3)Hospitalizations in the last year with date/reason? no  4)Review and Update Care Team (patient's specialists) in Epic 5) Complete PHQ9 in Epic  6) Complete Fall Screening in Epic 7)Review all Health Maintenance Due and order under PCP if not done.  Medicare Wellness Patient Questionnaire:  Answer theses question about your habits: Do you drink alcohol? NO If yes, how many drinks do you have a day?N/A Have you ever smoked? yes Quit date if applicable? 40 years ago   Do you use smokeless tobacco?No  Do you use an illicit drugs?No  Do you exercises? Yes IF so, what type and how many days/minutes per week? Ride bike and play basketball not sure maybe three times a day or 3-4 time a week  - usually gets several hours.  Are you sexually active? Yes Number of partners?1  Typical breakfast grits bacon eggs and/or cereal every now and then  Typical lunch Hamburger and/or something with chicken Typical dinner full course dinner  Typical snacks: cake cookies chips fruits and nuts   Beverages: water   Answer theses question about you: Can you perform most household chores? Yes  Do you find it hard to follow a conversation in a noisy room? No  Do you often ask people to speak up or repeat themselves? No Do you feel that you have a problem with memory? No Do you balance your checkbook  and or bank acounts? Yes  Do you feel safe at home? Yes  Last dentist visit? 2 months ago  Do you need assistance with any of the following: Please note if so NO  Driving?   Feeding yourself?   Getting from bed to chair?  Getting to the toilet?  Bathing or showering?  Dressing yourself?  Managing money?  Climbing a flight of stairs  Preparing meals?    Do you have Advanced Directives in place (Living Will, Healthcare Power or Attorney)?  no   Last eye Exam and location? [facility] March 2024   Do you currently use prescribed or non-prescribed narcotic or opioid pain medications? No  Do you have a history or close family history of breast, ovarian, tubal or peritoneal cancer or a family member with BRCA (breast cancer susceptibility 1 and 2) gene mutations? No   Patient is not at home can not do vitals. Request home vitals (wt, BP, etc.) and enter into vitals, THEN update Vital Signs SmartPhrase below at the top of the HPI. See below.   Nurse/Assistant Credentials/time stamp:  Dorris Fetch, CMA 10:49 AM   ----------------------------------------------------------------------------------------------------------------------------------------------------------------------------------------------------------------------  Because this visit was a virtual/telehealth visit, some criteria may be missing or patient reported. Any vitals not documented were not able to be obtained and vitals that have been documented are patient reported.    MEDICARE ANNUAL PREVENTIVE CARE VISIT WITH PROVIDER (Welcome to Medicare, initial annual wellness or annual wellness exam)  Virtual Visit via Phone Note  I  connected with Luis Reed on 03/21/23  by phone and verified that I am speaking with the correct person using two identifiers.  Location patient: home Location provider:work or home office Persons participating in the virtual visit: patient, provider  Concerns and/or follow up  today: no concerns.    See HM section in Epic for other details of completed HM.    ROS: negative for report of fevers, unintentional weight loss, vision changes, vision loss, hearing loss or change, chest pain, sob, hemoptysis, melena, hematochezia, hematuria, falls, bleeding or bruising, thoughts of suicide or self harm, memory loss  Patient-completed extensive health risk assessment - reviewed and discussed with the patient: See Health Risk Assessment completed with patient prior to the visit either above or in recent phone note. This was reviewed in detailed with the patient today and appropriate recommendations, orders and referrals were placed as needed per Summary below and patient instructions.   Review of Medical History: -PMH, PSH, Family History and current specialty and care providers reviewed and updated and listed below   Patient Care Team: Karie Georges, MD as PCP - General (Family Medicine) Cliffton Asters, MD (Inactive) as Consulting Physician (Infectious Diseases) Terrial Rhodes, MD as Consulting Physician (Nephrology)   Past Medical History:  Diagnosis Date   Blood transfusion without reported diagnosis    Chronic insomnia 07/18/2014   Hepatitis C    HIV infection (HCC)    Hypertension    Malignant hyperthermia    Renal disorder renal failure   sees Dr. Dierdre Highman    SEIZURE DISORDER 03/15/2006   Qualifier: History of  By: Orvan Falconer MD, John  - pt states last seizure 2000    Past Surgical History:  Procedure Laterality Date   AV FISTULA PLACEMENT     COLONOSCOPY  2004   LIGATION OF ARTERIOVENOUS  FISTULA Left 03/16/2018   Procedure: LIGATION OF ARTERIOVENOUS  FISTULA LEFT ARM;  Surgeon: Larina Earthly, MD;  Location: [facility] OR;  Service: Vascular;  Laterality: Left;   NEPHRECTOMY TRANSPLANTED ORGAN      Social History   Socioeconomic History   Marital status: Married    Spouse name: Steward Drone   Number of children: 2   Years of education: 12    Highest education level: Not on file  Occupational History   Occupation: Disabled    Comment: Kidney disease   Tobacco Use   Smoking status: Former    Current packs/day: 0.30    Average packs/day: 0.3 packs/day for 20.0 years (6.0 ttl pk-yrs)    Types: Cigarettes   Smokeless tobacco: Never   Tobacco comments:    quit smoking cigaettes at age 50  Vaping Use   Vaping status: Never Used  Substance and Sexual Activity   Alcohol use: No   Drug use: No   Sexual activity: Not Currently    Partners: Female    Comment: declined condoms  Other Topics Concern   Not on file  Social History Narrative   Born and raised in [place]. Currently resides in an house with his wife. No pets. Fun: fishing.    Denies religious beliefs that would effect health care.    Caffeine none.   Social Determinants of Health   Financial Resource Strain: Low Risk  (03/15/2022)   Overall Financial Resource Strain (CARDIA)    Difficulty of Paying Living Expenses: Not hard at all  Food Insecurity: No Food Insecurity (04/06/2022)   Hunger Vital Sign    Worried About Running Out of  Food in the Last Year: Never true    Ran Out of Food in the Last Year: Never true  Transportation Needs: No Transportation Needs (04/06/2022)   PRAPARE - Administrator, Civil Service (Medical): No    Lack of Transportation (Non-Medical): No  Physical Activity: Sufficiently Active (03/15/2022)   Exercise Vital Sign    Days of Exercise per Week: 3 days    Minutes of Exercise per Session: 150+ min  Stress: No Stress Concern Present (03/15/2022)   [employer] of Occupational Health - Occupational Stress Questionnaire    Feeling of Stress : Not at all  Social Connections: Moderately Integrated (03/15/2022)   Social Connection and Isolation Panel [NHANES]    Frequency of Communication with Friends and Family: More than three times a week    Frequency of Social Gatherings with Friends and Family: More than three  times a week    Attends Religious Services: More than 4 times per year    Active Member of [employer] or Organizations: Yes    Attends Engineer, structural: More than 4 times per year    Marital Status: Divorced  Intimate Partner Violence: Not At Risk (03/15/2022)   Humiliation, Afraid, Rape, and Kick questionnaire    Fear of Current or Ex-Partner: No    Emotionally Abused: No    Physically Abused: No    Sexually Abused: No    Family History  Problem Relation Age of Onset   Prostate cancer Father 69   Colon cancer Neg Hx    Colon polyps Neg Hx    Esophageal cancer Neg Hx    Rectal cancer Neg Hx    Stomach cancer Neg Hx     Current Outpatient Medications on File Prior to Visit  Medication Sig Dispense Refill   amLODipine (NORVASC) 5 MG tablet Take 1 tablet (5 mg total) by mouth daily. 90 tablet 3   calcitRIOL (ROCALTROL) 0.5 MCG capsule Take 0.5 mcg by mouth at bedtime.  3   CELLCEPT 250 MG capsule Take 250 mg by mouth 2 (two) times daily.  3   dolutegravir (TIVICAY) 50 MG tablet TAKE 1 TABLET (50 MG TOTAL) BY MOUTH DAILY. 30 tablet 11   emtricitabine-tenofovir AF (DESCOVY) 200-25 MG tablet TAKE 1 TABLET BY MOUTH AT BEDTIME. 30 tablet 11   methocarbamol (ROBAXIN) 500 MG tablet Take 1 tablet (500 mg total) by mouth at bedtime as needed for muscle spasms. 20 tablet 0   predniSONE (DELTASONE) 5 MG tablet Take 5 mg by mouth every Monday, Wednesday, and Friday at 8 PM.      predniSONE (STERAPRED UNI-PAK 21 TAB) 10 MG (21) TBPK tablet Take as directed 21 tablet 0   tacrolimus (PROGRAF) 1 MG capsule Take 2 mg by mouth 2 (two) times daily.      tacrolimus (PROGRAF) 5 MG capsule Take 5 mg by mouth 2 (two) times daily.      zolpidem (AMBIEN) 10 MG tablet Take 10 mg by mouth at bedtime as needed for sleep.   1   Current Facility-Administered Medications on File Prior to Visit  Medication Dose Route Frequency Provider Last Rate Last Admin   betamethasone acetate-betamethasone sodium  phosphate (CELESTONE) injection 3 mg  3 mg Intramuscular Once Felecia Shelling, DPM        Allergies  Allergen Reactions   Hydrocodone Itching and Other (See Comments)    fidgety       Physical Exam Vitals requested from patient and listed below  if patient had equipment and was able to obtain at home for this virtual visit: There were no vitals filed for this visit. Estimated body mass index is 25.59 kg/m as calculated from the following:   Height as of 06/13/22: 5\' 8"  (1.727 m).   Weight as of 06/13/22: 168 lb 4.8 oz (76.3 kg).  EKG (optional): deferred due to virtual visit  GENERAL: alert, oriented, no acute distress detected; full vision exam deferred due to pandemic and/or virtual encounter  PSYCH/NEURO: pleasant and cooperative, no obvious depression or anxiety, speech and thought processing grossly intact, Cognitive function grossly intact  Flowsheet Row Office Visit from 06/13/2022 in [facility] [place]  PHQ-9 Total Score 1           06/13/2022    2:16 PM 04/28/2022    9:20 AM 04/06/2022    9:25 AM 03/15/2022   11:19 AM 03/25/2021    9:06 AM  Depression screen PHQ 2/9  Decreased Interest 0 0 0 0 0  Down, Depressed, Hopeless 0 0 0 0 0  PHQ - 2 Score 0 0 0 0 0  Altered sleeping 1      Tired, decreased energy 0      Change in appetite 0      Feeling bad or failure about yourself  0      Trouble concentrating 0      Moving slowly or fidgety/restless 0      PHQ-9 Score 1      Difficult doing work/chores Not difficult at all           03/02/2021   10:19 AM 03/25/2021    9:06 AM 03/15/2022   11:21 AM 04/28/2022    9:20 AM 06/13/2022    2:16 PM  Fall Risk  Falls in the past year?  0 0 0 0  Was there an injury with Fall? 0  0 0 0  Fall Risk Category Calculator   0 0 0  Fall Risk Category (Retired)   Low Low   (RETIRED) Patient Fall Risk Level   Low fall risk Low fall risk   Patient at Risk for Falls Due to Impaired vision  No Fall Risks No  Fall Risks Other (Comment)  Fall risk Follow up Falls prevention discussed  Falls prevention discussed Falls evaluation completed Falls evaluation completed     SUMMARY AND PLAN:  Encounter for Medicare annual wellness exam  Discussed applicable health maintenance/preventive health measures and advised and referred or ordered per patient preferences: -PCP had ordered cologuard - but he reports he prefers to do colonoscopy as did that in the past and feels is better. He agrees to call GI today to schedule.  -discussed vaccines due and advised per CDC, he agrees to get at pharmacy, requested he bring copies of each for his records here Health Maintenance  Topic Date Due   DTaP/Tdap/Td (1 - Tdap) Never done   Zoster Vaccines- Shingrix (1 of 2) Never done   Colonoscopy  09/08/2019   INFLUENZA VACCINE  12/08/2022   COVID-19 Vaccine (3 - 2023-24 season) 01/08/2023   Medicare Annual Wellness (AWV)  03/20/2024   Hepatitis C Screening  Completed   HIV Screening  Completed   HPV VACCINES  Aged Out   Fecal DNA (Cologuard)  Discontinued   Education and counseling on the following was provided based on the above review of health and a plan/checklist for the patient, along with additional information discussed, was provided for the patient  in the patient instructions :  -Advised on importance of completing advanced directives, discussed options for completing and provided information in patient instructions as well -Advised and counseled on a healthy lifestyle  -Reviewed patient's current diet. Advised and counseled on a whole foods based healthy diet. A summary of a healthy diet was provided in the Patient Instructions.  -reviewed patient's current physical activity level and discussed exercise guidelines for adults. Congratulated on regular exercise. Resources provided - see pt instructions.  -Advise yearly dental visits at minimum and regular eye exams -he is due for in office physical and  requests to schedule - sent message to schedulers and advised he call office if they do not contact him in the next 1-2 days.   Follow up: see patient instructions   Patient Instructions  I really enjoyed getting to talk with you today! I am available on Tuesdays and Thursdays for virtual visits if you have any questions or concerns, or if I can be of any further assistance.   CHECKLIST FROM ANNUAL WELLNESS VISIT:  -Follow up (please call to schedule if not scheduled after visit):   -call office to schedule your physical   -yearly for annual wellness visit with primary care office  Here is a list of your preventive care/health maintenance measures and the plan for each if any are due:  PLAN For any measures below that may be due:   -call the gastroenterology office, let us know if any assistance is needed -can get the vaccines at the pharmacy, please let us know when you do so that we can update your chart - bring receipt for each  Health Maintenance  Topic Date Due   DTaP/Tdap/Td (1 - Tdap) Never done   Zoster Vaccines- Shingrix (1 of 2) Never done   Colonoscopy  09/08/2019   INFLUENZA VACCINE  12/08/2022   COVID-19 Vaccine (3 - 2023-24 season) 01/08/2023   Medicare Annual Wellness (AWV)  03/20/2024   Hepatitis C Screening  Completed   HIV Screening  Completed   HPV VACCINES  Aged Out   Fecal DNA (Cologuard)  Discontinued    -See a dentist at least yearly  -Get your eyes checked and then per your eye specialist's recommendations  -Other issues addressed today:   -I have included below further information regarding a healthy whole foods based diet, physical activity guidelines for adults, stress management and opportunities for social connections. I hope you find this information useful.    -----------------------------------------------------------------------------------------------------------------------------------------------------------------------------------------------------------------------------------------------------------  NUTRITION: -eat real food: lots of colorful vegetables (half the plate) and fruits -5-7 servings of vegetables and fruits per day (fresh or steamed is best), exp. 2 servings of vegetables with lunch and dinner and 2 servings of fruit per day. Berries and greens such as kale and collards are great choices.  -consume on a regular basis: whole grains (make sure first ingredient on label contains the word "whole"), fresh fruits, fish, nuts, seeds, healthy oils (such as olive oil, avocado oil, grape seed oil) -may eat small amounts of dairy and lean meat on occasion, but avoid processed meats such as ham, bacon, lunch meat, etc. -drink water -try to avoid fast food and pre-packaged foods, processed meat -most experts advise limiting sodium to < 2300mg  per day, should limit further is any chronic conditions such as high blood pressure, heart disease, diabetes, etc. The American Heart Association advised that < 1500mg  is is ideal -try to avoid foods that contain any ingredients with names you do not recognize  -try  to avoid sugar/sweets (except for the natural sugar that occurs in fresh fruit) -try to avoid sweet drinks -try to avoid white rice, white bread, pasta (unless whole grain), white or yellow potatoes  EXERCISE GUIDELINES FOR ADULTS: -if you wish to increase your physical activity, do so gradually and with the approval of your doctor -STOP and seek medical care immediately if you have any chest pain, chest discomfort or trouble breathing when starting or increasing exercise  -move and stretch your body, legs, feet and arms when sitting for long periods -Physical activity guidelines for optimal health in adults: -least 150 minutes per week of  aerobic exercise (can talk, but not sing) once approved by your doctor, 20-30 minutes of sustained activity or two 10 minute episodes of sustained activity every day.  -resistance training at least 2 days per week if approved by your doctor -balance exercises 3+ days per week:   Stand somewhere where you have something sturdy to hold onto if you lose balance.    1) lift up on toes, start with 5x per day and work up to 20x   2) stand and lift on leg straight out to the side so that foot is a few inches of the floor, start with 5x each side and work up to 20x each side   3) stand on one foot, start with 5 seconds each side and work up to 20 seconds on each side  If you need ideas or help with getting more active:  -Silver sneakers https://tools.silversneakers.com  -Walk with a Doc: http://www.duncan-williams.com/  -try to include resistance (weight lifting/strength building) and balance exercises twice per week: or the following link for ideas: http://castillo-powell.com/  BuyDucts.dk  STRESS MANAGEMENT: -can try meditating, or just sitting quietly with deep breathing while intentionally relaxing all parts of your body for 5 minutes daily -if you need further help with stress, anxiety or depression please follow up with your primary doctor or contact the wonderful folks at [facility]: [phone]  SOCIAL CONNECTIONS: -options in [place] if you wish to engage in more social and exercise related activities:  -Silver sneakers https://tools.silversneakers.com  -Walk with a Doc: http://www.duncan-williams.com/  -Check out the [facility] Active Adults 50+ section on the [place] [employer] (hiking clubs, book clubs, cards and games, chess, exercise classes, aquatic classes and much more) - see the website for  details: https://www.[place].gov/departments/parks-recreation/active-adults50  -YouTube has lots of exercise videos for different ages and abilities as well  -[facility] (a variety of indoor and outdoor inperson activities for adults). [phone]. [address].  -Virtual Online Classes (a variety of topics): see seniorplanet.org or call [phone]  -consider volunteering at a school, hospice center, church, senior center or elsewhere         ADVANCED HEALTHCARE DIRECTIVES:  [place] Advanced Directives assistance:   ExpressWeek.com.cy  Everyone should have advanced health care directives in place. This is so that you get the care you want, should you ever be in a situation where you are unable to make your own medical decisions.   From the [place] Advanced Directive Website: "Advance Health Care Directives are legal documents in which you give written instructions about your health care if, in the future, you cannot speak for yourself.   A health care power of attorney allows you to name a person you trust to make your health care decisions if you cannot make them yourself. A declaration of a desire for a natural death (or living will) is document, which  states that you desire not to have your life prolonged by extraordinary measures if you have a terminal or incurable illness or if you are in a vegetative state. An advance instruction for mental health treatment makes a declaration of instructions, information and preferences regarding your mental health treatment. It also states that you are aware that the advance instruction authorizes a mental health treatment provider to act according to your wishes. It may also outline your consent or refusal of mental health treatment. A declaration of an anatomical gift allows anyone over the age of 46 to make a gift by will, organ donor card or other  document."   Please see the following website or an elder law attorney for forms, FAQs and for completion of advanced directives: [place] TEFL teacher Health Care Directives Advance Health Care Directives (http://guzman.com/)  Or copy and paste the following to your web browser: PoshChat.fi    Terressa Koyanagi, DO

## 2023-03-21 NOTE — Patient Instructions (Addendum)
I really enjoyed getting to talk with you today! I am available on Tuesdays and Thursdays for virtual visits if you have any questions or concerns, or if I can be of any further assistance.   CHECKLIST FROM ANNUAL WELLNESS VISIT:  -Follow up (please call to schedule if not scheduled after visit):   -call office to schedule your physical   -yearly for annual wellness visit with primary care office  Here is a list of your preventive care/health maintenance measures and the plan for each if any are due:  PLAN For any measures below that may be due:   -call the gastroenterology office, let us know if any assistance is needed -can get the vaccines at the pharmacy, please let us know when you do so that we can update your chart - bring receipt for each  Health Maintenance  Topic Date Due   DTaP/Tdap/Td (1 - Tdap) Never done   Zoster Vaccines- Shingrix (1 of 2) Never done   Colonoscopy  09/08/2019   INFLUENZA VACCINE  12/08/2022   COVID-19 Vaccine (3 - 2023-24 season) 01/08/2023   Medicare Annual Wellness (AWV)  03/20/2024   Hepatitis C Screening  Completed   HIV Screening  Completed   HPV VACCINES  Aged Out   Fecal DNA (Cologuard)  Discontinued    -See a dentist at least yearly  -Get your eyes checked and then per your eye specialist's recommendations  -Other issues addressed today:   -I have included below further information regarding a healthy whole foods based diet, physical activity guidelines for adults, stress management and opportunities for social connections. I hope you find this information useful.   -----------------------------------------------------------------------------------------------------------------------------------------------------------------------------------------------------------------------------------------------------------  NUTRITION: -eat real food: lots of colorful vegetables (half the plate) and fruits -5-7 servings of vegetables and fruits  per day (fresh or steamed is best), exp. 2 servings of vegetables with lunch and dinner and 2 servings of fruit per day. Berries and greens such as kale and collards are great choices.  -consume on a regular basis: whole grains (make sure first ingredient on label contains the word "whole"), fresh fruits, fish, nuts, seeds, healthy oils (such as olive oil, avocado oil, grape seed oil) -may eat small amounts of dairy and lean meat on occasion, but avoid processed meats such as ham, bacon, lunch meat, etc. -drink water -try to avoid fast food and pre-packaged foods, processed meat -most experts advise limiting sodium to < 2300mg  per day, should limit further is any chronic conditions such as high blood pressure, heart disease, diabetes, etc. The American Heart Association advised that < 1500mg  is is ideal -try to avoid foods that contain any ingredients with names you do not recognize  -try to avoid sugar/sweets (except for the natural sugar that occurs in fresh fruit) -try to avoid sweet drinks -try to avoid white rice, white bread, pasta (unless whole grain), white or yellow potatoes  EXERCISE GUIDELINES FOR ADULTS: -if you wish to increase your physical activity, do so gradually and with the approval of your doctor -STOP and seek medical care immediately if you have any chest pain, chest discomfort or trouble breathing when starting or increasing exercise  -move and stretch your body, legs, feet and arms when sitting for long periods -Physical activity guidelines for optimal health in adults: -least 150 minutes per week of aerobic exercise (can talk, but not sing) once approved by your doctor, 20-30 minutes of sustained activity or two 10 minute episodes of sustained activity every day.  -resistance training at  least 2 days per week if approved by your doctor -balance exercises 3+ days per week:   Stand somewhere where you have something sturdy to hold onto if you lose balance.    1) lift up on  toes, start with 5x per day and work up to 20x   2) stand and lift on leg straight out to the side so that foot is a few inches of the floor, start with 5x each side and work up to 20x each side   3) stand on one foot, start with 5 seconds each side and work up to 20 seconds on each side  If you need ideas or help with getting more active:  -Silver sneakers https://tools.silversneakers.com  -Walk with a Doc: http://www.duncan-williams.com/  -try to include resistance (weight lifting/strength building) and balance exercises twice per week: or the following link for ideas: http://castillo-powell.com/  BuyDucts.dk  STRESS MANAGEMENT: -can try meditating, or just sitting quietly with deep breathing while intentionally relaxing all parts of your body for 5 minutes daily -if you need further help with stress, anxiety or depression please follow up with your primary doctor or contact the wonderful folks at [facility]: [phone]  SOCIAL CONNECTIONS: -options in [place] if you wish to engage in more social and exercise related activities:  -Silver sneakers https://tools.silversneakers.com  -Walk with a Doc: http://www.duncan-williams.com/  -Check out the [facility] Active Adults 50+ section on the [place] of [employer] (hiking clubs, book clubs, cards and games, chess, exercise classes, aquatic classes and much more) - see the website for details: https://www.[place].gov/departments/parks-recreation/active-adults50  -YouTube has lots of exercise videos for different ages and abilities as well  -[facility] (a variety of indoor and outdoor inperson activities for adults). [phone]. [address].  -Virtual Online Classes (a variety of topics): see seniorplanet.org or call (780)643-9119  -consider volunteering at a school, hospice center, church, senior  center or elsewhere         ADVANCED HEALTHCARE DIRECTIVES:  [place] Advanced Directives assistance:   ExpressWeek.com.cy  Everyone should have advanced health care directives in place. This is so that you get the care you want, should you ever be in a situation where you are unable to make your own medical decisions.   From the [place] Advanced Directive Website: "Advance Health Care Directives are legal documents in which you give written instructions about your health care if, in the future, you cannot speak for yourself.   A health care power of attorney allows you to name a person you trust to make your health care decisions if you cannot make them yourself. A declaration of a desire for a natural death (or living will) is document, which states that you desire not to have your life prolonged by extraordinary measures if you have a terminal or incurable illness or if you are in a vegetative state. An advance instruction for mental health treatment makes a declaration of instructions, information and preferences regarding your mental health treatment. It also states that you are aware that the advance instruction authorizes a mental health treatment provider to act according to your wishes. It may also outline your consent or refusal of mental health treatment. A declaration of an anatomical gift allows anyone over the age of 46 to make a gift by will, organ donor card or other document."   Please see the following website or an elder law attorney for forms, FAQs and for completion of advanced directives: [place] [employer]  Directives Advance Health Care Directives (http://guzman.com/)  Or copy and paste the following to your web browser: PoshChat.fi

## 2023-04-11 ENCOUNTER — Other Ambulatory Visit: Payer: Self-pay

## 2023-04-14 ENCOUNTER — Other Ambulatory Visit: Payer: Self-pay

## 2023-04-17 ENCOUNTER — Other Ambulatory Visit: Payer: Self-pay

## 2023-04-17 NOTE — Progress Notes (Signed)
Specialty Pharmacy Refill Coordination Note  Luis Reed is a 62 y.o. male contacted today regarding refills of specialty medication(s) Dolutegravir Sodium; Emtricitabine-Tenofovir Af   Patient requested Delivery   Delivery date: 04/19/23   Verified address: [address] [place] [address]   Medication will be filled on 04/18/23.

## 2023-04-18 ENCOUNTER — Other Ambulatory Visit: Payer: Self-pay

## 2023-04-19 ENCOUNTER — Encounter: Payer: Self-pay | Admitting: Infectious Disease

## 2023-04-19 DIAGNOSIS — Z940 Kidney transplant status: Secondary | ICD-10-CM

## 2023-04-19 HISTORY — DX: Kidney transplant status: Z94.0

## 2023-04-20 ENCOUNTER — Ambulatory Visit: Payer: Medicare HMO | Admitting: Infectious Disease

## 2023-04-20 DIAGNOSIS — Z940 Kidney transplant status: Secondary | ICD-10-CM

## 2023-04-20 DIAGNOSIS — Z21 Asymptomatic human immunodeficiency virus [HIV] infection status: Secondary | ICD-10-CM

## 2023-04-21 DIAGNOSIS — I10 Essential (primary) hypertension: Secondary | ICD-10-CM | POA: Diagnosis not present

## 2023-04-21 DIAGNOSIS — B1920 Unspecified viral hepatitis C without hepatic coma: Secondary | ICD-10-CM | POA: Diagnosis not present

## 2023-04-21 DIAGNOSIS — Z9225 Personal history of immunosupression therapy: Secondary | ICD-10-CM | POA: Diagnosis not present

## 2023-04-21 DIAGNOSIS — Z940 Kidney transplant status: Secondary | ICD-10-CM | POA: Diagnosis not present

## 2023-04-21 DIAGNOSIS — R8279 Other abnormal findings on microbiological examination of urine: Secondary | ICD-10-CM | POA: Diagnosis not present

## 2023-04-21 DIAGNOSIS — T8611 Kidney transplant rejection: Secondary | ICD-10-CM | POA: Diagnosis not present

## 2023-04-21 DIAGNOSIS — N189 Chronic kidney disease, unspecified: Secondary | ICD-10-CM | POA: Diagnosis not present

## 2023-04-21 DIAGNOSIS — N2581 Secondary hyperparathyroidism of renal origin: Secondary | ICD-10-CM | POA: Diagnosis not present

## 2023-04-21 DIAGNOSIS — M47816 Spondylosis without myelopathy or radiculopathy, lumbar region: Secondary | ICD-10-CM | POA: Diagnosis not present

## 2023-04-28 LAB — LAB REPORT - SCANNED: EGFR: 49

## 2023-05-09 ENCOUNTER — Other Ambulatory Visit (HOSPITAL_COMMUNITY): Payer: Self-pay

## 2023-05-15 ENCOUNTER — Other Ambulatory Visit (HOSPITAL_COMMUNITY): Payer: Self-pay

## 2023-05-15 ENCOUNTER — Other Ambulatory Visit: Payer: Self-pay

## 2023-05-16 ENCOUNTER — Other Ambulatory Visit (HOSPITAL_COMMUNITY): Payer: Self-pay

## 2023-05-17 ENCOUNTER — Other Ambulatory Visit (HOSPITAL_COMMUNITY): Payer: Self-pay

## 2023-05-17 ENCOUNTER — Other Ambulatory Visit: Payer: Self-pay

## 2023-05-18 ENCOUNTER — Other Ambulatory Visit (HOSPITAL_COMMUNITY): Payer: Self-pay

## 2023-05-19 ENCOUNTER — Ambulatory Visit: Payer: Medicare HMO | Admitting: Family Medicine

## 2023-05-19 ENCOUNTER — Other Ambulatory Visit (HOSPITAL_COMMUNITY): Payer: Self-pay

## 2023-05-19 ENCOUNTER — Encounter: Payer: Self-pay | Admitting: Family Medicine

## 2023-05-19 ENCOUNTER — Telehealth: Payer: Self-pay

## 2023-05-19 VITALS — BP 128/76 | HR 72 | Temp 98.3°F | Ht 68.0 in | Wt 169.6 lb

## 2023-05-19 DIAGNOSIS — Z0001 Encounter for general adult medical examination with abnormal findings: Secondary | ICD-10-CM

## 2023-05-19 DIAGNOSIS — G4709 Other insomnia: Secondary | ICD-10-CM

## 2023-05-19 DIAGNOSIS — I10 Essential (primary) hypertension: Secondary | ICD-10-CM | POA: Diagnosis not present

## 2023-05-19 DIAGNOSIS — Z23 Encounter for immunization: Secondary | ICD-10-CM | POA: Diagnosis not present

## 2023-05-19 DIAGNOSIS — Z0000 Encounter for general adult medical examination without abnormal findings: Secondary | ICD-10-CM

## 2023-05-19 DIAGNOSIS — Z13220 Encounter for screening for lipoid disorders: Secondary | ICD-10-CM | POA: Diagnosis not present

## 2023-05-19 DIAGNOSIS — Z1211 Encounter for screening for malignant neoplasm of colon: Secondary | ICD-10-CM

## 2023-05-19 LAB — LIPID PANEL
Cholesterol: 108 mg/dL (ref 0–200)
HDL: 55.4 mg/dL (ref >39.00)
LDL Cholesterol: 41 mg/dL (ref 0–99)
NonHDL: 53.02
Total CHOL/HDL Ratio: 2
Triglycerides: 61 mg/dL (ref 0.0–149.0)
VLDL: 12.2 mg/dL (ref 0.0–40.0)

## 2023-05-19 MED ORDER — TRAZODONE HCL 50 MG PO TABS: 25.0000 mg | ORAL_TABLET | Freq: Every evening | ORAL | 3 refills | Status: DC | PRN

## 2023-05-19 MED ORDER — AMLODIPINE BESYLATE 5 MG PO TABS: 5.0000 mg | ORAL_TABLET | Freq: Every day | ORAL | 3 refills | Status: DC

## 2023-05-19 NOTE — Progress Notes (Signed)
 Complete physical exam  Patient: Luis Reed   DOB: Aug 23, 1960   63 y.o. Male  MRN: [id number]  Subjective:    Chief Complaint  Patient presents with   Annual Exam    Luis Reed is a 63 y.o. male who presents today for a complete physical exam. He reports consuming a general diet. Home exercise routine includes walking 2 hrs per week. He generally feels well. He reports sleeping not well, states he has difficulty feeling tired enough to fall asleep. He does not have additional problems to discuss today.    Most recent fall risk assessment:    06/13/2022    2:16 PM  Fall Risk   Falls in the past year? 0  Number falls in past yr: 0  Injury with Fall? 0  Risk for fall due to : Other (Comment)  Follow up Falls evaluation completed     Most recent depression screenings:    06/13/2022    2:16 PM 04/28/2022    9:20 AM  PHQ 2/9 Scores  PHQ - 2 Score 0 0  PHQ- 9 Score 1     Vision:Within last year and wears glasses  and Dental: No current dental problems and Receives regular dental care  Patient Active Problem List   Diagnosis Date Noted   Renal transplant recipient 04/19/2023   COVID-19 virus infection 05/20/2019   Malignant hyperthermia    Other specified anxiety disorders 07/18/2014   RA (retrograde amnesia) 07/18/2014   TBI (traumatic brain injury) (HCC) 07/18/2014   Post concussive syndrome 05/13/2014   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   Essential (primary) hypertension 06/06/2011   KIDNEY TRANSPLANTATION 07/11/2007   Hepatitis C virus infection without hepatic coma 03/15/2006   SYPHILIS 03/15/2006   HYDROCELE NOS 03/15/2006   ALCOHOL ABUSE, HX OF 03/15/2006   HEPATITIS B, HX OF 03/15/2006   TOBACCO USE, QUIT 03/15/2006   PARATHYROIDECTOMY 08/08/2002   CEREBROVASCULAR ACCIDENT, ACUTE 10/08/1999   Human immunodeficiency virus (HIV) disease (HCC) 05/10/1999    Patient Care Team: Ozell Heron HERO, MD as PCP - General (Family  Medicine) Elaine Rush, MD (Inactive) as Consulting Physician (Infectious Diseases) Rayburn Pac, MD as Consulting Physician (Nephrology)   Outpatient Medications Prior to Visit  Medication Sig   calcitRIOL  (ROCALTROL ) 0.5 MCG capsule Take 0.5 mcg by mouth at bedtime.   CELLCEPT  250 MG capsule Take 250 mg by mouth 2 (two) times daily.   dolutegravir  (TIVICAY ) 50 MG tablet TAKE 1 TABLET (50 MG TOTAL) BY MOUTH DAILY.   emtricitabine -tenofovir  AF (DESCOVY ) 200-25 MG tablet TAKE 1 TABLET BY MOUTH AT BEDTIME.   methocarbamol  (ROBAXIN ) 500 MG tablet Take 1 tablet (500 mg total) by mouth at bedtime as needed for muscle spasms.   predniSONE  (DELTASONE ) 5 MG tablet Take 5 mg by mouth every Monday, Wednesday, and Friday at 8 PM.    predniSONE  (STERAPRED UNI-PAK 21 TAB) 10 MG (21) TBPK tablet Take as directed   tacrolimus  (PROGRAF ) 1 MG capsule Take 2 mg by mouth 2 (two) times daily.    tacrolimus  (PROGRAF ) 5 MG capsule Take 5 mg by mouth 2 (two) times daily.    [DISCONTINUED] amLODipine  (NORVASC ) 5 MG tablet Take 1 tablet (5 mg total) by mouth daily.   [DISCONTINUED] zolpidem  (AMBIEN ) 10 MG tablet Take 10 mg by mouth at bedtime as needed for sleep.    Facility-Administered Medications Prior to Visit  Medication Dose Route Frequency Provider   betamethasone  acetate-betamethasone  sodium phosphate (CELESTONE ) injection 3 mg  3 mg Intramuscular Once Janit Thresa HERO, DPM    Review of Systems  HENT:  Negative for hearing loss.   Eyes:  Negative for blurred vision.  Respiratory:  Negative for shortness of breath.   Cardiovascular:  Negative for chest pain.  Gastrointestinal: Negative.   Genitourinary: Negative.   Musculoskeletal:  Negative for back pain.  Neurological:  Negative for headaches.  Psychiatric/Behavioral:  Negative for depression.        Objective:     BP 128/76 (BP Location: Left Arm, Patient Position: Sitting, Cuff Size: Normal)   Pulse 72   Temp 98.3 F (36.8 C) (Oral)    Ht 5' 8 (1.727 m)   Wt 169 lb 9.6 oz (76.9 kg)   SpO2 99%   BMI 25.79 kg/m    Physical Exam Vitals reviewed.  Constitutional:      Appearance: Normal appearance. He is well-groomed and normal weight.  HENT:     Right Ear: Tympanic membrane and ear canal normal.     Left Ear: Tympanic membrane and ear canal normal.     Mouth/Throat:     Mouth: Mucous membranes are moist.     Pharynx: No posterior oropharyngeal erythema.  Eyes:     Conjunctiva/sclera: Conjunctivae normal.  Neck:     Thyroid : No thyromegaly.  Cardiovascular:     Rate and Rhythm: Normal rate and regular rhythm.     Heart sounds: S1 normal and S2 normal. No murmur heard. Pulmonary:     Effort: Pulmonary effort is normal.     Breath sounds: Normal breath sounds and air entry. No rales.  Abdominal:     General: Bowel sounds are normal.  Musculoskeletal:     Right lower leg: No edema.     Left lower leg: No edema.  Lymphadenopathy:     Cervical: No cervical adenopathy.  Neurological:     General: No focal deficit present.     Mental Status: He is alert and oriented to person, place, and time.     Gait: Gait is intact.  Psychiatric:        Mood and Affect: Mood and affect normal.        Behavior: Behavior normal.      No results found for any visits on 05/19/23.     Assessment & Plan:    Routine Health Maintenance and Physical Exam  Immunization History  Administered Date(s) Administered   Influenza Split 02/10/2011, 01/08/2012   Influenza Whole 02/06/2005, 03/14/2006, 02/09/2009, 02/09/2010   Influenza, Seasonal, Injecte, Preservative Fre 05/19/2023   Influenza,inj,Quad PF,6+ Mos 01/17/2013, 01/16/2014, 04/23/2015, 02/20/2020, 03/25/2021   Influenza-Unspecified 04/08/2005, 07/26/2005, 02/07/2007, 03/10/2019, 02/06/2022   Janssen (J&J) SARS-COV-2 Vaccination 07/08/2019   Pfizer Covid-19 Vaccine Bivalent Booster 31yrs & up 03/25/2021   Pneumococcal Polysaccharide-23 07/26/2005, 10/18/2005,  02/10/2011   Pneumococcal-Unspecified 02/25/2005    Health Maintenance  Topic Date Due   DTaP/Tdap/Td (1 - Tdap) Never done   Zoster Vaccines- Shingrix  (1 of 2) Never done   Pneumococcal Vaccine 39-16 Years old (3 of 3 - PCV) 02/10/2012   Colonoscopy  09/08/2019   COVID-19 Vaccine (3 - 2024-25 season) 01/08/2023   Medicare Annual Wellness (AWV)  03/20/2024   INFLUENZA VACCINE  Completed   Hepatitis C Screening  Completed   HIV Screening  Completed   HPV VACCINES  Aged Out   Fecal DNA (Cologuard)  Discontinued    Discussed health benefits of physical activity, and encouraged him to engage in regular exercise appropriate for his age  and condition.  Need for influenza vaccination -     Flu vaccine trivalent PF, 6mos and older(Flulaval,Afluria,Fluarix,Fluzone)  Essential (primary) hypertension -     amLODIPine  Besylate; Take 1 tablet (5 mg total) by mouth daily.  Dispense: 90 tablet; Refill: 3  Colon cancer screening -     Ambulatory referral to Gastroenterology  Lipid screening -     Lipid panel  Routine general medical examination at a health care facility  Other insomnia -     traZODone  HCl; Take 0.5-1 tablets (25-50 mg total) by mouth at bedtime as needed for sleep.  Dispense: 30 tablet; Refill: 3  Normal physical exam findings except for the poor sleep reported on review of general health. Counseled patient on healthy sleep habits/hygiene, will give him a trial of trazodone  to take at night as needed to help with sleep. Pt needs new lipid panel today for risk factor assessment,. Orders placed. Handouts given on healthy eating and exercise.   Return in 1 year (on 05/18/2024) for annual physical exam.     Heron CHRISTELLA Sharper, MD

## 2023-05-19 NOTE — Telephone Encounter (Signed)
 [facility] Patient Advocate Encounter   I was successful in securing patient a $5000 grant from [employer] (PAF) to provide copayment coverage for descovy  & tivicay .  This will make the out of pocket cost $0.     I have spoken with the patient.    The billing information is as follows and has been shared with [facility].   RxBin: [id number] PCN:   [id number] Member ID: [id number] Group ID:  [id number] Dates of Eligibility: 05/19/23 through 05/18/24    Patient knows to call the office with questions or concerns.  Charmaine Sharps, CPhT Specialty Pharmacy Patient [facility] Phone: [phone] Fax:  [phone]

## 2023-05-19 NOTE — Progress Notes (Signed)
 Specialty Pharmacy Refill Coordination Note  Luis Reed is a 63 y.o. male contacted today regarding refills of specialty medication(s) Dolutegravir  Sodium (Tivicay ); Emtricitabine -Tenofovir  AF (Descovy )   Patient requested Courier to Provider Office   Delivery date: 05/23/23   Verified address: [address] [place] [address]   Medication will be filled on 05/21/22.

## 2023-05-19 NOTE — Patient Instructions (Signed)
 Health Maintenance, Male  Adopting a healthy lifestyle and getting preventive care are important in promoting health and wellness. Ask your health care provider about:  The right schedule for you to have regular tests and exams.  Things you can do on your own to prevent diseases and keep yourself healthy.  What should I know about diet, weight, and exercise?  Eat a healthy diet    Eat a diet that includes plenty of vegetables, fruits, low-fat dairy products, and lean protein.  Do not eat a lot of foods that are high in solid fats, added sugars, or sodium.  Maintain a healthy weight  Body mass index (BMI) is a measurement that can be used to identify possible weight problems. It estimates body fat based on height and weight. Your health care provider can help determine your BMI and help you achieve or maintain a healthy weight.  Get regular exercise  Get regular exercise. This is one of the most important things you can do for your health. Most adults should:  Exercise for at least 150 minutes each week. The exercise should increase your heart rate and make you sweat (moderate-intensity exercise).  Do strengthening exercises at least twice a week. This is in addition to the moderate-intensity exercise.  Spend less time sitting. Even light physical activity can be beneficial.  Watch cholesterol and blood lipids  Have your blood tested for lipids and cholesterol at 65 years of age, then have this test every 5 years.  You may need to have your cholesterol levels checked more often if:  Your lipid or cholesterol levels are high.  You are older than 65 years of age.  You are at high risk for heart disease.  What should I know about cancer screening?  Many types of cancers can be detected early and may often be prevented. Depending on your health history and family history, you may need to have cancer screening at various ages. This may include screening for:  Colorectal cancer.  Prostate cancer.  Skin cancer.  Lung  cancer.  What should I know about heart disease, diabetes, and high blood pressure?  Blood pressure and heart disease  High blood pressure causes heart disease and increases the risk of stroke. This is more likely to develop in people who have high blood pressure readings or are overweight.  Talk with your health care provider about your target blood pressure readings.  Have your blood pressure checked:  Every 3-5 years if you are 24-52 years of age.  Every year if you are 3 years old or older.  If you are between the ages of 60 and 72 and are a current or former smoker, ask your health care provider if you should have a one-time screening for abdominal aortic aneurysm (AAA).  Diabetes  Have regular diabetes screenings. This checks your fasting blood sugar level. Have the screening done:  Once every three years after age 66 if you are at a normal weight and have a low risk for diabetes.  More often and at a younger age if you are overweight or have a high risk for diabetes.  What should I know about preventing infection?  Hepatitis B  If you have a higher risk for hepatitis B, you should be screened for this virus. Talk with your health care provider to find out if you are at risk for hepatitis B infection.  Hepatitis C  Blood testing is recommended for:  Everyone born from [birth year] through 1967.  Anyone  with known risk factors for hepatitis C.  Sexually transmitted infections (STIs)  You should be screened each year for STIs, including gonorrhea and chlamydia, if:  You are sexually active and are younger than 65 years of age.  You are older than 65 years of age and your health care provider tells you that you are at risk for this type of infection.  Your sexual activity has changed since you were last screened, and you are at increased risk for chlamydia or gonorrhea. Ask your health care provider if you are at risk.  Ask your health care provider about whether you are at high risk for HIV. Your health care provider  may recommend a prescription medicine to help prevent HIV infection. If you choose to take medicine to prevent HIV, you should first get tested for HIV. You should then be tested every 3 months for as long as you are taking the medicine.  Follow these instructions at home:  Alcohol use  Do not drink alcohol if your health care provider tells you not to drink.  If you drink alcohol:  Limit how much you have to 0-2 drinks a day.  Know how much alcohol is in your drink. In the [place] one drink equals one 12 oz bottle of beer (355 mL), one 5 oz glass of wine (148 mL), or one 1 oz glass of hard liquor (44 mL).  Lifestyle  Do not use any products that contain nicotine or tobacco. These products include cigarettes, chewing tobacco, and vaping devices, such as e-cigarettes. If you need help quitting, ask your health care provider.  Do not use street drugs.  Do not share needles.  Ask your health care provider for help if you need support or information about quitting drugs.  General instructions  Schedule regular health, dental, and eye exams.  Stay current with your vaccines.  Tell your health care provider if:  You often feel depressed.  You have ever been abused or do not feel safe at home.  Summary  Adopting a healthy lifestyle and getting preventive care are important in promoting health and wellness.  Follow your health care provider's instructions about healthy diet, exercising, and getting tested or screened for diseases.  Follow your health care provider's instructions on monitoring your cholesterol and blood pressure.  This information is not intended to replace advice given to you by your health care provider. Make sure you discuss any questions you have with your health care provider.  Document Revised: 09/14/2020 Document Reviewed: 09/14/2020  Elsevier Patient Education  2024 [employer].

## 2023-05-22 ENCOUNTER — Other Ambulatory Visit (HOSPITAL_COMMUNITY): Payer: Self-pay

## 2023-06-02 ENCOUNTER — Telehealth: Payer: Self-pay | Admitting: Pharmacist

## 2023-06-02 NOTE — Telephone Encounter (Signed)
Please reach out to patient for rescheduling appt with Dr. Daiva Eves. Pharmacy has not been able to reach for pharmacy refills since November.

## 2023-06-05 NOTE — Telephone Encounter (Signed)
Amazing - thank you!

## 2023-06-12 ENCOUNTER — Other Ambulatory Visit: Payer: Self-pay

## 2023-06-15 ENCOUNTER — Other Ambulatory Visit: Payer: Self-pay | Admitting: Family Medicine

## 2023-06-15 DIAGNOSIS — G4709 Other insomnia: Secondary | ICD-10-CM

## 2023-06-26 ENCOUNTER — Encounter: Payer: Self-pay | Admitting: Infectious Disease

## 2023-06-26 DIAGNOSIS — E785 Hyperlipidemia, unspecified: Secondary | ICD-10-CM

## 2023-06-26 DIAGNOSIS — I10 Essential (primary) hypertension: Secondary | ICD-10-CM | POA: Insufficient documentation

## 2023-06-26 HISTORY — DX: Essential (primary) hypertension: I10

## 2023-06-26 HISTORY — DX: Hyperlipidemia, unspecified: E78.5

## 2023-06-26 NOTE — Progress Notes (Unsigned)
 Subjective:   Chief complaint: follow-up for HIV disease on medications   Patient ID: Luis Reed, male    DOB: Dec 19, 1960, 63 y.o.   MRN: [id number]  HPI  Discussed the use of AI scribe software for clinical note transcription with the patient, who gave verbal consent to proceed.  History of Present Illness   The patient, a 63 year old with a history of HIV and kidney transplant, presents for a routine follow-up. He has been adherent to his antiretroviral therapy (ART) regimen of Tivicay and Descovy and has had a successful kidney transplant since 2007. He reports good adherence to his other medications, including amlodipine for blood pressure, prednisone, CellCept, and Prograf for transplant maintenance, and trazodone for sleep. The patient is engaged in his care, knowledgeable about his medications, and has a good track record of managing his health conditions.       Past Medical History:  Diagnosis Date   Blood transfusion without reported diagnosis    Chronic insomnia 07/18/2014   Hepatitis C    HIV infection (HCC)    HTN (hypertension) 06/26/2023   Hyperlipidemia 06/26/2023   Hypertension    Malignant hyperthermia    Renal disorder renal failure   sees Dr. Dierdre Highman    Renal transplant recipient 04/19/2023   SEIZURE DISORDER 03/15/2006   Qualifier: History of  By: Orvan Falconer MD, [name]  - pt states last seizure 2000    Past Surgical History:  Procedure Laterality Date   AV FISTULA PLACEMENT     COLONOSCOPY  2004   LIGATION OF ARTERIOVENOUS  FISTULA Left 03/16/2018   Procedure: LIGATION OF ARTERIOVENOUS  FISTULA LEFT ARM;  Surgeon: Larina Earthly, MD;  Location: [facility] OR;  Service: Vascular;  Laterality: Left;   NEPHRECTOMY TRANSPLANTED ORGAN      Family History  Problem Relation Age of Onset   Prostate cancer Father 49   Colon cancer Neg Hx    Colon polyps Neg Hx    Esophageal cancer Neg Hx    Rectal cancer Neg Hx    Stomach cancer Neg Hx       Social  History   Socioeconomic History   Marital status: Married    Spouse name: Steward Drone   Number of children: 2   Years of education: 12   Highest education level: Not on file  Occupational History   Occupation: Disabled    Comment: Kidney disease   Tobacco Use   Smoking status: Former    Current packs/day: 0.30    Average packs/day: 0.3 packs/day for 20.0 years (6.0 ttl pk-yrs)    Types: Cigarettes   Smokeless tobacco: Never   Tobacco comments:    quit smoking cigaettes at age 48  Vaping Use   Vaping status: Never Used  Substance and Sexual Activity   Alcohol use: No   Drug use: No   Sexual activity: Not Currently    Partners: Female    Comment: declined condoms  Other Topics Concern   Not on file  Social History Narrative   Born and raised in [place]. Currently resides in an house with his wife. No pets. Fun: fishing.    Denies religious beliefs that would effect health care.    Caffeine none.   Social Drivers of Corporate investment banker Strain: Low Risk  (03/15/2022)   Overall Financial Resource Strain (CARDIA)    Difficulty of Paying Living Expenses: Not hard at all  Food Insecurity: No Food Insecurity (04/06/2022)   Hunger Vital  Sign    Worried About Programme researcher, broadcasting/film/video in the Last Year: Never true    Ran Out of Food in the Last Year: Never true  Transportation Needs: No Transportation Needs (04/06/2022)   PRAPARE - Administrator, Civil Service (Medical): No    Lack of Transportation (Non-Medical): No  Physical Activity: Sufficiently Active (03/15/2022)   Exercise Vital Sign    Days of Exercise per Week: 3 days    Minutes of Exercise per Session: 150+ min  Stress: No Stress Concern Present (03/15/2022)   [employer] of Occupational Health - Occupational Stress Questionnaire    Feeling of Stress : Not at all  Social Connections: Moderately Integrated (03/15/2022)   Social Connection and Isolation Panel [NHANES]    Frequency of  Communication with Friends and Family: More than three times a week    Frequency of Social Gatherings with Friends and Family: More than three times a week    Attends Religious Services: More than 4 times per year    Active Member of [employer] or Organizations: Yes    Attends Engineer, structural: More than 4 times per year    Marital Status: Divorced    Allergies  Allergen Reactions   Hydrocodone Itching and Other (See Comments)    fidgety     Current Outpatient Medications:    amLODipine (NORVASC) 5 MG tablet, Take 1 tablet (5 mg total) by mouth daily., Disp: 90 tablet, Rfl: 3   calcitRIOL (ROCALTROL) 0.5 MCG capsule, Take 0.5 mcg by mouth at bedtime., Disp: , Rfl: 3   CELLCEPT 250 MG capsule, Take 250 mg by mouth 2 (two) times daily., Disp: , Rfl: 3   methocarbamol (ROBAXIN) 500 MG tablet, Take 1 tablet (500 mg total) by mouth at bedtime as needed for muscle spasms., Disp: 20 tablet, Rfl: 0   predniSONE (DELTASONE) 5 MG tablet, Take 5 mg by mouth every Monday, Wednesday, and Friday at 8 PM. , Disp: , Rfl:    predniSONE (STERAPRED UNI-PAK 21 TAB) 10 MG (21) TBPK tablet, Take as directed, Disp: 21 tablet, Rfl: 0   tacrolimus (PROGRAF) 1 MG capsule, Take 2 mg by mouth 2 (two) times daily. , Disp: , Rfl:    tacrolimus (PROGRAF) 5 MG capsule, Take 5 mg by mouth 2 (two) times daily. , Disp: , Rfl:    traZODone (DESYREL) 50 MG tablet, TAKE 0.5-1 TABLETS BY MOUTH AT BEDTIME AS NEEDED FOR SLEEP., Disp: 90 tablet, Rfl: 2  Current Facility-Administered Medications:    betamethasone acetate-betamethasone sodium phosphate (CELESTONE) injection 3 mg, 3 mg, Intramuscular, Once, Felecia Shelling, DPM   Review of Systems  Constitutional:  Negative for activity change, appetite change, chills, diaphoresis, fatigue, fever and unexpected weight change.  HENT:  Negative for congestion, rhinorrhea, sinus pressure, sneezing, sore throat and trouble swallowing.   Eyes:  Negative for photophobia and  visual disturbance.  Respiratory:  Negative for cough, chest tightness, shortness of breath, wheezing and stridor.   Cardiovascular:  Negative for chest pain, palpitations and leg swelling.  Gastrointestinal:  Negative for abdominal distention, abdominal pain, anal bleeding, blood in stool, constipation, diarrhea, nausea and vomiting.  Genitourinary:  Negative for difficulty urinating, dysuria, flank pain and hematuria.  Musculoskeletal:  Negative for arthralgias, back pain, gait problem, joint swelling and myalgias.  Skin:  Negative for color change, pallor, rash and wound.  Neurological:  Negative for dizziness, tremors, weakness and light-headedness.  Hematological:  Negative for adenopathy. Does not bruise/bleed  easily.  Psychiatric/Behavioral:  Negative for agitation, behavioral problems, confusion, decreased concentration, dysphoric mood and sleep disturbance.        Objective:   Physical Exam Constitutional:      Appearance: He is well-developed.  HENT:     Head: Normocephalic and atraumatic.  Eyes:     Conjunctiva/sclera: Conjunctivae normal.  Cardiovascular:     Rate and Rhythm: Normal rate and regular rhythm.  Pulmonary:     Effort: Pulmonary effort is normal. No respiratory distress.     Breath sounds: No wheezing.  Abdominal:     General: There is no distension.     Palpations: Abdomen is soft.  Musculoskeletal:        General: No tenderness. Normal range of motion.     Cervical back: Normal range of motion and neck supple.  Skin:    General: Skin is warm and dry.     Coloration: Skin is not pale.     Findings: No erythema or rash.  Neurological:     General: No focal deficit present.     Mental Status: He is alert and oriented to person, place, and time.  Psychiatric:        Mood and Affect: Mood normal.        Behavior: Behavior normal.        Thought Content: Thought content normal.        Judgment: Judgment normal.           Assessment & Plan:    Assessment and Plan    HIV Stable on Tivicay and Descovy. Discussed the option of switching to [facility] for simplification to a single pill regimen, but patient prefers to continue current regimen. -Continue Tivicay and Descovy. -Check labs today to monitor disease control., HIV RNA, CD4  Kidney Transplant Stable since 2007. Patient is adherent to medications including Prograf, CellCept, and Prednisone. -Continue current regimen.  Hyperlipidemia Not currently on a statin. Discussed the benefits of statin therapy in reducing cardiovascular risk in patients with HIV. -Start a atorvastatin pending review of potential drug interactions. -Inform patient to report any muscle pain or inflammation.   Vaccine counseling: recommended and patient received COVID 19 updated vaccine  Follow-up in 6 months. If patient decides to switch to [facility], he can inform the clinic.

## 2023-06-27 ENCOUNTER — Other Ambulatory Visit (HOSPITAL_COMMUNITY): Payer: Self-pay

## 2023-06-27 ENCOUNTER — Other Ambulatory Visit: Payer: Self-pay

## 2023-06-27 ENCOUNTER — Encounter: Payer: Self-pay | Admitting: Infectious Disease

## 2023-06-27 ENCOUNTER — Ambulatory Visit: Payer: Medicare HMO | Admitting: Infectious Disease

## 2023-06-27 VITALS — BP 146/87 | HR 96 | Temp 98.0°F | Ht 68.0 in | Wt 169.0 lb

## 2023-06-27 DIAGNOSIS — Z113 Encounter for screening for infections with a predominantly sexual mode of transmission: Secondary | ICD-10-CM | POA: Diagnosis not present

## 2023-06-27 DIAGNOSIS — B20 Human immunodeficiency virus [HIV] disease: Secondary | ICD-10-CM | POA: Diagnosis not present

## 2023-06-27 DIAGNOSIS — Z23 Encounter for immunization: Secondary | ICD-10-CM | POA: Diagnosis not present

## 2023-06-27 DIAGNOSIS — Z940 Kidney transplant status: Secondary | ICD-10-CM

## 2023-06-27 DIAGNOSIS — I10 Essential (primary) hypertension: Secondary | ICD-10-CM

## 2023-06-27 DIAGNOSIS — E785 Hyperlipidemia, unspecified: Secondary | ICD-10-CM | POA: Diagnosis not present

## 2023-06-27 MED ORDER — TIVICAY 50 MG PO TABS
50.0000 mg | ORAL_TABLET | Freq: Every day | ORAL | 11 refills | Status: AC
  Filled 2023-06-27 – 2023-08-11 (×2): qty 30, 30d supply, fill #0
  Filled 2023-09-08: qty 30, 30d supply, fill #1
  Filled 2023-10-05: qty 30, 30d supply, fill #2
  Filled 2023-11-03: qty 30, 30d supply, fill #3
  Filled 2023-12-05: qty 30, 30d supply, fill #4
  Filled 2024-01-01 – 2024-02-13 (×2): qty 30, 30d supply, fill #5
  Filled 2024-03-07: qty 30, 30d supply, fill #6
  Filled 2024-04-01: qty 30, 30d supply, fill #7
  Filled 2024-05-01 – 2024-06-14 (×4): qty 30, 30d supply, fill #8

## 2023-06-27 MED ORDER — ATORVASTATIN CALCIUM 10 MG PO TABS
10.0000 mg | ORAL_TABLET | Freq: Every day | ORAL | 11 refills | Status: AC
  Filled 2023-06-27: qty 30, 30d supply, fill #0

## 2023-06-27 MED ORDER — EMTRICITABINE-TENOFOVIR AF 200-25 MG PO TABS
1.0000 | ORAL_TABLET | Freq: Every day | ORAL | 11 refills | Status: AC
  Filled 2023-06-27 – 2023-08-11 (×2): qty 30, 30d supply, fill #0
  Filled 2023-09-08: qty 30, 30d supply, fill #1
  Filled 2023-10-05: qty 30, 30d supply, fill #2
  Filled 2023-11-03: qty 30, 30d supply, fill #3
  Filled 2023-12-05: qty 30, 30d supply, fill #4
  Filled 2024-01-01 – 2024-02-13 (×2): qty 30, 30d supply, fill #5
  Filled 2024-03-07: qty 30, 30d supply, fill #6
  Filled 2024-04-01: qty 30, 30d supply, fill #7
  Filled 2024-05-01 – 2024-06-14 (×4): qty 30, 30d supply, fill #8

## 2023-06-30 LAB — COMPLETE METABOLIC PANEL WITH GFR
AG Ratio: 1.4 (calc) (ref 1.0–2.5)
ALT: 7 U/L — ABNORMAL LOW (ref 9–46)
AST: 12 U/L (ref 10–35)
Albumin: 4.2 g/dL (ref 3.6–5.1)
Alkaline phosphatase (APISO): 65 U/L (ref 35–144)
BUN/Creatinine Ratio: 11 (calc) (ref 6–22)
BUN: 17 mg/dL (ref 7–25)
CO2: 23 mmol/L (ref 20–32)
Calcium: 7.4 mg/dL — ABNORMAL LOW (ref 8.6–10.3)
Chloride: 106 mmol/L (ref 98–110)
Creat: 1.6 mg/dL — ABNORMAL HIGH (ref 0.70–1.35)
Globulin: 3.1 g/dL (ref 1.9–3.7)
Glucose, Bld: 94 mg/dL (ref 65–99)
Potassium: 3.9 mmol/L (ref 3.5–5.3)
Sodium: 139 mmol/L (ref 135–146)
Total Bilirubin: 0.5 mg/dL (ref 0.2–1.2)
Total Protein: 7.3 g/dL (ref 6.1–8.1)
eGFR: 48 mL/min/{1.73_m2} — ABNORMAL LOW (ref >=60)

## 2023-06-30 LAB — LIPID PANEL
Cholesterol: 108 mg/dL (ref <200)
HDL: 54 mg/dL (ref >=40)
LDL Cholesterol (Calc): 42 mg/dL
Non-HDL Cholesterol (Calc): 54 mg/dL (ref <130)
Total CHOL/HDL Ratio: 2 (calc) (ref <5.0)
Triglycerides: 43 mg/dL (ref <150)

## 2023-06-30 LAB — CBC WITH DIFFERENTIAL/PLATELET
Absolute Lymphocytes: 2241 {cells}/uL (ref 850–3900)
Absolute Monocytes: 454 {cells}/uL (ref 200–950)
Basophils Absolute: 32 {cells}/uL (ref 0–200)
Basophils Relative: 0.6 %
Eosinophils Absolute: 49 {cells}/uL (ref 15–500)
Eosinophils Relative: 0.9 %
HCT: 40.8 % (ref 38.5–50.0)
Hemoglobin: 14 g/dL (ref 13.2–17.1)
MCH: 32.6 pg (ref 27.0–33.0)
MCHC: 34.3 g/dL (ref 32.0–36.0)
MCV: 94.9 fL (ref 80.0–100.0)
MPV: 9.4 fL (ref 7.5–12.5)
Monocytes Relative: 8.4 %
Neutro Abs: 2624 {cells}/uL (ref 1500–7800)
Neutrophils Relative %: 48.6 %
Platelets: 239 10*3/uL (ref 140–400)
RBC: 4.3 10*6/uL (ref 4.20–5.80)
RDW: 12.2 % (ref 11.0–15.0)
Total Lymphocyte: 41.5 %
WBC: 5.4 10*3/uL (ref 3.8–10.8)

## 2023-06-30 LAB — C. TRACHOMATIS/N. GONORRHOEAE RNA
C. trachomatis RNA, TMA: NOT DETECTED
N. gonorrhoeae RNA, TMA: NOT DETECTED

## 2023-06-30 LAB — T-HELPER CELLS (CD4) COUNT (NOT AT ARMC)
Absolute CD4: 856 {cells}/uL (ref 490–1740)
CD4 T Helper %: 37 % (ref 30–61)
Total lymphocyte count: 2334 {cells}/uL (ref 850–3900)

## 2023-06-30 LAB — HIV RNA, RTPCR W/R GT (RTI, PI,INT)
HIV 1 RNA Quant: NOT DETECTED {copies}/mL
HIV-1 RNA Quant, Log: NOT DETECTED {Log}

## 2023-06-30 LAB — RPR: RPR Ser Ql: NONREACTIVE

## 2023-07-05 ENCOUNTER — Encounter: Payer: Self-pay | Admitting: Family Medicine

## 2023-07-07 ENCOUNTER — Other Ambulatory Visit: Payer: Self-pay | Admitting: Pharmacist

## 2023-07-07 NOTE — Progress Notes (Signed)
 Specialty Pharmacy Ongoing Clinical Assessment Note  Luis Reed is a 63 y.o. male who is being followed by the specialty pharmacy service for RxSp HIV   Patient's specialty medication(s) reviewed today: Dolutegravir Sodium (Tivicay); Emtricitabine-Tenofovir AF (DESCOVY)   Missed doses in the last 4 weeks: More than 5   Patient/Caregiver did not have any additional questions or concerns.   Therapeutic benefit summary: Patient is achieving benefit   Adverse events/side effects summary: No adverse events/side effects   Patient's therapy is appropriate to: Continue    Goals Addressed             This Visit's Progress    Achieve Undetectable HIV Viral Load < 20       Patient is on track. Patient will work on increased adherence      Increase CD4 count until steady state       Patient is on track. Patient will work on increased adherence      Maintain optimal adherence to therapy       Patient is not on track and no change. Patient will work on increased adherence         Follow up:  6 months  Jennette Kettle Specialty Pharmacist

## 2023-07-13 DIAGNOSIS — N2581 Secondary hyperparathyroidism of renal origin: Secondary | ICD-10-CM | POA: Diagnosis not present

## 2023-07-13 DIAGNOSIS — Z940 Kidney transplant status: Secondary | ICD-10-CM | POA: Diagnosis not present

## 2023-07-13 DIAGNOSIS — B1920 Unspecified viral hepatitis C without hepatic coma: Secondary | ICD-10-CM | POA: Diagnosis not present

## 2023-08-09 ENCOUNTER — Other Ambulatory Visit: Payer: Self-pay

## 2023-08-11 ENCOUNTER — Other Ambulatory Visit: Payer: Self-pay

## 2023-08-11 NOTE — Progress Notes (Signed)
 Specialty Pharmacy Refill Coordination Note  Luis Reed is a 63 y.o. male contacted today regarding refills of specialty medication(s) Dolutegravir Sodium (Tivicay); Emtricitabine-Tenofovir AF (DESCOVY)   Patient requested Delivery   Delivery date: 08/15/23   Verified address: [address] [place] [address]   Medication will be filled on 04.07.25.

## 2023-08-14 ENCOUNTER — Other Ambulatory Visit: Payer: Self-pay

## 2023-08-28 DIAGNOSIS — N2581 Secondary hyperparathyroidism of renal origin: Secondary | ICD-10-CM | POA: Diagnosis not present

## 2023-08-28 DIAGNOSIS — T8611 Kidney transplant rejection: Secondary | ICD-10-CM | POA: Diagnosis not present

## 2023-08-28 DIAGNOSIS — I10 Essential (primary) hypertension: Secondary | ICD-10-CM | POA: Diagnosis not present

## 2023-08-28 DIAGNOSIS — Z940 Kidney transplant status: Secondary | ICD-10-CM | POA: Diagnosis not present

## 2023-08-28 DIAGNOSIS — Z9225 Personal history of immunosupression therapy: Secondary | ICD-10-CM | POA: Diagnosis not present

## 2023-08-28 DIAGNOSIS — R8279 Other abnormal findings on microbiological examination of urine: Secondary | ICD-10-CM | POA: Diagnosis not present

## 2023-08-28 DIAGNOSIS — B20 Human immunodeficiency virus [HIV] disease: Secondary | ICD-10-CM | POA: Diagnosis not present

## 2023-08-28 DIAGNOSIS — Z789 Other specified health status: Secondary | ICD-10-CM | POA: Diagnosis not present

## 2023-09-08 ENCOUNTER — Other Ambulatory Visit: Payer: Self-pay

## 2023-09-08 NOTE — Progress Notes (Signed)
 Specialty Pharmacy Refill Coordination Note  Luis Reed is a 63 y.o. male contacted today regarding refills of specialty medication(s) Dolutegravir  Sodium (Tivicay ); Emtricitabine -Tenofovir  AF (DESCOVY )   Patient requested Delivery   Delivery date: 09/12/23   Verified address: [address] [place] [address]   Medication will be filled on 05.05.25.

## 2023-09-11 ENCOUNTER — Other Ambulatory Visit: Payer: Self-pay

## 2023-10-05 ENCOUNTER — Other Ambulatory Visit: Payer: Self-pay

## 2023-10-05 NOTE — Progress Notes (Signed)
 Specialty Pharmacy Refill Coordination Note  Luis Reed is a 62 y.o. male contacted today regarding refills of specialty medication(s) Dolutegravir  Sodium (Tivicay ); Emtricitabine -Tenofovir  AF (DESCOVY )   Patient requested Delivery   Delivery date: 10/10/23   Verified address: [address] [place]  [address]   Medication will be filled on 06.02.25.

## 2023-10-09 ENCOUNTER — Ambulatory Visit: Admitting: Podiatry

## 2023-10-09 ENCOUNTER — Encounter: Payer: Self-pay | Admitting: Podiatry

## 2023-10-09 DIAGNOSIS — B351 Tinea unguium: Secondary | ICD-10-CM | POA: Diagnosis not present

## 2023-10-09 MED ORDER — NEOMYCIN-POLYMYXIN-HC 3.5-10000-1 OT SUSP: OTIC | 0 refills | Status: DC

## 2023-10-09 NOTE — Patient Instructions (Signed)

## 2023-10-09 NOTE — Progress Notes (Signed)
  Subjective:  Patient ID: Luis Reed, male    DOB: 01/26/1961,  MRN: [id number]  Chief Complaint  Patient presents with   Nail Problem    Medial side of left great toe dark and pain. 4 pain. Non diabetic.    63 y.o. male presents with the above complaint. History confirmed with patient.   Objective:  Physical Exam: warm, good capillary refill, no trophic changes or ulcerative lesions, normal DP and PT pulses, normal sensory exam, and severe left hallux onychomycosis with onycholysis and dystrophy   Assessment:   1. Onychomycosis      Plan:  Patient was evaluated and treated and all questions answered.    Mycotic Nail, left -Patient elects to proceed with minor surgery to remove Mycotic toenail today. Consent reviewed and signed by patient. -Mycotic nail excised. See procedure note. -Educated on post-procedure care including soaking. Written instructions provided and reviewed. -Rx for Cortisporin sent to pharmacy. -Advised on signs and symptoms of infection developing.  We discussed that the phenol likely will create some redness and edema and tenderness around the nailbed as long as it is localized this is to be expected.  Will return as needed if any infection signs develop  Procedure: Excision of Mycotic Toenail Location: Left 1st toe  nail  Anesthesia: Lidocaine  1% plain; 1.5 mL and Marcaine 0.5% plain; 1.5 mL, digital block. Skin Prep: Betadine. Dressing: Silvadene; telfa; dry, sterile, compression dressing. Technique: Following skin prep, the toe was exsanguinated and a tourniquet was secured at the base of the toe. The affected nail was freed and excised. Chemical matrixectomy was then performed with phenol and irrigated out with alcohol. The tourniquet was then removed and sterile dressing applied. Disposition: Patient tolerated procedure well.    Return if symptoms worsen or fail to improve.

## 2023-11-03 ENCOUNTER — Other Ambulatory Visit: Payer: Self-pay

## 2023-11-07 ENCOUNTER — Other Ambulatory Visit (HOSPITAL_COMMUNITY): Payer: Self-pay

## 2023-11-07 ENCOUNTER — Other Ambulatory Visit: Payer: Self-pay

## 2023-11-07 NOTE — Progress Notes (Signed)
 Specialty Pharmacy Refill Coordination Note  Spoke with   [name], [name] (Self)    Luis Reed is a 63 y.o. male contacted today regarding refills of specialty medication(s) Dolutegravir  Sodium (Tivicay ); Emtricitabine -Tenofovir  AF (DESCOVY )  Patient requested: Delivery   Delivery date: 11/09/23   Verified address: [address]  [place] [address]  Medication will be filled on 11/08/23.

## 2023-11-08 DIAGNOSIS — B1920 Unspecified viral hepatitis C without hepatic coma: Secondary | ICD-10-CM | POA: Diagnosis not present

## 2023-11-08 DIAGNOSIS — Z940 Kidney transplant status: Secondary | ICD-10-CM | POA: Diagnosis not present

## 2023-11-08 LAB — LAB REPORT - SCANNED
Calcium: 6.8
EGFR: 42
PTH: 20

## 2023-11-16 ENCOUNTER — Encounter: Payer: Self-pay | Admitting: Podiatry

## 2023-11-16 ENCOUNTER — Ambulatory Visit (INDEPENDENT_AMBULATORY_CARE_PROVIDER_SITE_OTHER)

## 2023-11-16 ENCOUNTER — Ambulatory Visit: Admitting: Podiatry

## 2023-11-16 DIAGNOSIS — L02612 Cutaneous abscess of left foot: Secondary | ICD-10-CM | POA: Diagnosis not present

## 2023-11-16 DIAGNOSIS — L03032 Cellulitis of left toe: Secondary | ICD-10-CM | POA: Diagnosis not present

## 2023-11-16 MED ORDER — DOXYCYCLINE HYCLATE 100 MG PO TABS: 100.0000 mg | ORAL_TABLET | Freq: Two times a day (BID) | ORAL | 0 refills | Status: AC

## 2023-11-16 NOTE — Progress Notes (Signed)
       Subjective:  Patient ID: Luis Reed, male    DOB: 28-Feb-1961,  MRN: [id number]  Chief Complaint  Patient presents with   Toe Pain    Hallux left - nail avulsion with phenol on 10/09/23, feels that it might be infected-notice swelling and increased pain, darkeness around base of nail     Luis Reed presents to clinic today for f/u of PNA to the left hallux, total nail.  This is a Dr. McCaughan patient.  Procedure was on 10/09/2023.  He is concerned about infection in the area been slow to heal.  He does report swelling and some increased pain around the base the nail and the tissue to the eponychium does appear dark.  PCP is Ozell Heron HERO, MD.  Allergies  Allergen Reactions   Hydrocodone Itching and Other (See Comments)    fidgety    Objective:  There were no vitals filed for this visit.  Vascular Examination: Capillary refill time is less than 3 seconds to toes bilateral. Palpable pedal pulses b/l LE. Digital hair present b/l. No pedal edema b/l. Skin temperature gradient WNL b/l. No varicosities b/l. No cyanosis or clubbing noted b/l.   Dermatological Examination: Upon inspection of the PNA site, there is small area of mixed serous with yellowish fibrotic slough appearing drainage proximal lateral aspect of the nailbed.  Left first toe is edematous.  Tissue proximal to the eponychial the nailbed is edematous and darkened in appearance.  Radiographs: Left foot 3 views 11/16/2023 Normal osseous mineralization.  Joint spaces preserved.  No acute fractures.  No signs of osteolysis, cortical erosion or periosteal reaction to the distal phalanx of the first toe.  Assessment/Plan: 1. Cellulitis and abscess of toe of left foot     Meds ordered this encounter  Medications   doxycycline  (VIBRA -TABS) 100 MG tablet    Sig: Take 1 tablet (100 mg total) by mouth 2 (two) times daily for 10 days.    Dispense:  20 tablet    Refill:  0   Clinical findings reviewed with  patient.  Radiographs reviewed with patient.  Culture swab obtained of the area of drainage.  Started doxycycline  out of abundance of caution.  Empiric antibiotics somewhat limited based on patient's concurrent medications.  He is immunocompromise due to history of kidney transplantation and HIV.  Antibiotic bandage applied.  Return in about 2 weeks (around 11/30/2023) for Toe cellulitis.   Ethan LITTIE Saddler, DPM, AACFAS [facility]     [address] [place], [place] [address]                Office [phone]  Fax [phone]

## 2023-11-19 ENCOUNTER — Ambulatory Visit: Payer: Self-pay | Admitting: Podiatry

## 2023-11-19 LAB — WOUND CULTURE
MICRO NUMBER:: 16682576
SPECIMEN QUALITY:: ADEQUATE

## 2023-12-01 ENCOUNTER — Other Ambulatory Visit (HOSPITAL_COMMUNITY): Payer: Self-pay

## 2023-12-05 ENCOUNTER — Other Ambulatory Visit: Payer: Self-pay

## 2023-12-05 NOTE — Progress Notes (Signed)
 Specialty Pharmacy Refill Coordination Note  Luis Reed is a 63 y.o. male contacted today regarding refills of specialty medication(s) Dolutegravir  Sodium (Tivicay ); Emtricitabine -Tenofovir  AF (DESCOVY )   Patient requested Delivery   Delivery date: 12/07/23   Verified address: [address]  [place] [address]   Medication will be filled on 07.30.25.

## 2023-12-18 ENCOUNTER — Other Ambulatory Visit: Payer: Self-pay | Admitting: Family Medicine

## 2023-12-18 ENCOUNTER — Ambulatory Visit: Admitting: Family Medicine

## 2023-12-18 ENCOUNTER — Encounter: Payer: Self-pay | Admitting: Family Medicine

## 2023-12-18 ENCOUNTER — Ambulatory Visit: Payer: Self-pay

## 2023-12-18 VITALS — BP 158/86 | HR 55 | Temp 97.8°F | Ht 68.0 in | Wt 170.0 lb

## 2023-12-18 DIAGNOSIS — K5900 Constipation, unspecified: Secondary | ICD-10-CM | POA: Diagnosis not present

## 2023-12-18 DIAGNOSIS — R1013 Epigastric pain: Secondary | ICD-10-CM | POA: Diagnosis not present

## 2023-12-18 NOTE — Telephone Encounter (Signed)
 Spoke with the patient and informed him a visit is needed for evaluation.  Appt was scheduled with NP today as PCP does not have any openings.

## 2023-12-18 NOTE — Telephone Encounter (Signed)
 FYI Only or Action Required?: Action required by provider: update on patient condition and requesting medication.  Patient was last seen in primary care on 05/19/2023 by Ozell Heron HERO, MD.  Called Nurse Triage reporting Constipation and Abdominal Pain.  Symptoms began several days ago.  Interventions attempted: eating high fiber diet, walking, drinking water.  Symptoms are: constipation (no BM x 2-3 days), intermittent mild epigastric pain unchanged.  Triage Disposition: Home Care  Patient/caregiver understands and will follow disposition?: Yes          Copied from CRM #[id number]. Topic: Clinical - Medication Question >> Dec 18, 2023  8:51 AM Viola F wrote:  Patient hasn't had bowel movement 2-3 days, stomach hurts and would like a medication in liquid form sent to pharmacy on file Reason for Disposition  MILD constipation  Answer Assessment - Initial Assessment Questions 1. STOOL PATTERN OR FREQUENCY: How often do you have a bowel movement (BM)?  (Normal range: 3 times a day to every 3 days)  When was your last BM?       Twice to three times daily. Last BM was 2-3 days ago.  2. STRAINING: Do you have to strain to have a BM?      Yes.  3. ONSET: When did the constipation begin?     He states just about 2-3 days.  4. RECTAL PAIN: Does your rectum hurt when the stool comes out? If Yes, ask: Do you have hemorrhoids? How bad is the pain?  (Scale 1-10; or mild, moderate, severe)     No.  5. BM COMPOSITION: Are the stools hard?      Yes.  6. BLOOD ON STOOLS: Has there been any blood on the toilet tissue or on the surface of the BM? If Yes, ask: When was the last time?     No.  7. CHRONIC CONSTIPATION: Is this a new problem for you?  If No, ask: How long have you had this problem? (days, weeks, months)      New problem, 2-3 days.  8. CHANGES IN DIET OR HYDRATION: Have there been any recent changes in your diet? How much fluids are you drinking  on a daily basis?  How much have you had to drink today?     No changes to diet. He states he drinks water all day long and gets 64 fl oz daily.  9. MEDICINES: Have you been taking any new medicines? Are you taking any narcotic pain medicines? (e.g., Dilaudid , morphine, Percocet, Vicodin)     No.  10. LAXATIVES: Have you been using any stool softeners, laxatives, or enemas?  If Yes, ask What are you using, how often, and when was the last time?       No.  11. ACTIVITY:  How much walking do you do every day?  Has your activity level decreased in the past week?        He states he walks all the time.  12. CAUSE: What do you think is causing the constipation?        He thought it was a side effect from his calcium  pills.  13. MEDICAL HISTORY: Do you have a history of hemorrhoids, rectal fissures, rectal surgery, or rectal abscess?         No.  14. OTHER SYMPTOMS: Do you have any other symptoms? (e.g., abdomen pain, bloating, fever, vomiting)       2-3/10 intermittent epigastric pain x 2-3 days, bloating, gas. Denies nausea, vomiting, fever.  15.  PREGNANCY: Is there any chance you are pregnant? When was your last menstrual period?       N/A.  Protocols used: Constipation-A-AH

## 2023-12-18 NOTE — Patient Instructions (Addendum)
-  It was a pleasure to take care of you today. -Ordered labs (CBC, CMP, and Lipase) for abd pain. Office will call with lab results and will results will be available on MyChart.  -Ordered STAT CT scan of abd. You should receive a phone call in the next day about scheduling image. Please call back or send a MyChart message if you do not receive a phone call about scheduling appointment.  -Recommend to increase water intake and try over the counter Miralax to have a bowel movement.

## 2023-12-18 NOTE — Progress Notes (Signed)
 Acute Office Visit   Subjective:  Patient ID: DESHANE COTRONEO, male    DOB: [birth year], 63 y.o.   MRN: [id number]  Chief Complaint  Patient presents with   Constipation    HPI Patient is complaining of upper gastric pain that started 2 days ago. Gradually started. Pain described as throbbing pain, intermittent. Pain lasting a couple of seconds. Denies the pain radiating. Denies nausea, vomiting, diarrhea or fever. Constipated, last BM was 2 days ago.  Denies having pain like this before or having constipation.   He has not took any medication  or done anything for his symptoms.   ROS See HPI above      Objective:   BP (!) 158/86   Pulse (!) 55   Temp 97.8 F (36.6 C) (Oral)   Ht 5' 8 (1.727 m)   Wt 170 lb (77.1 kg)   SpO2 97%   BMI 25.85 kg/m    Physical Exam Vitals reviewed.  Constitutional:      General: He is not in acute distress.    Appearance: Normal appearance. He is overweight. He is not ill-appearing, toxic-appearing or diaphoretic.  HENT:     Head: Normocephalic and atraumatic.  Eyes:     General:        Right eye: No discharge.        Left eye: No discharge.     Conjunctiva/sclera: Conjunctivae normal.  Cardiovascular:     Rate and Rhythm: Normal rate and regular rhythm.     Heart sounds: Normal heart sounds. No murmur heard.    No friction rub. No gallop.  Pulmonary:     Effort: Pulmonary effort is normal. No respiratory distress.     Breath sounds: Normal breath sounds.  Abdominal:     General: Bowel sounds are increased. There is no distension.     Palpations: There is no mass.     Tenderness: There is abdominal tenderness (Mild-epigastric) in the right upper quadrant, right lower quadrant, left upper quadrant and left lower quadrant. There is no right CVA tenderness or left CVA tenderness.  Musculoskeletal:        General: Normal range of motion.  Skin:    General: Skin is warm and dry.  Neurological:     General: No focal deficit  present.     Mental Status: He is alert and oriented to person, place, and time. Mental status is at baseline.     Motor: No weakness.     Gait: Gait normal.  Psychiatric:        Mood and Affect: Mood normal.        Behavior: Behavior normal.        Thought Content: Thought content normal.        Judgment: Judgment normal.       Assessment & Plan:  Epigastric pain -     CBC with Differential/Platelet -     Comprehensive metabolic panel with GFR -     Lipase -     CT ABDOMEN PELVIS WO CONTRAST; Future  Constipation, unspecified constipation type  -Ordered labs (CBC, CMP, and Lipase) for abd pain. Office will call with lab results and results will be available on MyChart.  -Ordered STAT CT scan of abd. Advised he should receive a phone call in the next day about scheduling image. Please call back or send a MyChart message if he does not receive a phone call about scheduling appointment.  -Recommend to increase water intake and try  over the counter Miralax to have a bowel movement.   Treazure Nery, NP

## 2023-12-18 NOTE — Telephone Encounter (Signed)
 Noted- ok to close.

## 2023-12-19 ENCOUNTER — Telehealth: Payer: Self-pay | Admitting: *Deleted

## 2023-12-19 ENCOUNTER — Telehealth: Payer: Self-pay | Admitting: Family Medicine

## 2023-12-19 ENCOUNTER — Other Ambulatory Visit

## 2023-12-19 DIAGNOSIS — R1013 Epigastric pain: Secondary | ICD-10-CM

## 2023-12-19 NOTE — Addendum Note (Signed)
 Addended by: ELNER NANNY B on: 12/19/2023 03:53 PM   Modules accepted: Orders

## 2023-12-19 NOTE — Telephone Encounter (Signed)
 Pt stated he would like his lab results from 12/19/23 sent to [facility] RONAL Sellar, MD Nephrology

## 2023-12-19 NOTE — Addendum Note (Signed)
 Addended by: ELNER NANNY B on: 12/19/2023 02:16 PM   Modules accepted: Orders

## 2023-12-19 NOTE — Telephone Encounter (Signed)
 Pt stated he's coming in office for labs.

## 2023-12-19 NOTE — Telephone Encounter (Unsigned)
 Copied from CRM #[id number]. Topic: General - Other >> Dec 19, 2023  9:33 AM Berneda FALCON wrote: Reason for CRM: Patient would like to know if we can please send over his labs to [facility] instead? He went to have the labs done yesterday but the lab was closed.  Address: [address], [place], [place] [address] Phone: [phone]  Patient callback is [phone]

## 2023-12-19 NOTE — Telephone Encounter (Signed)
 noted

## 2023-12-20 ENCOUNTER — Ambulatory Visit: Payer: Self-pay | Admitting: Family Medicine

## 2023-12-20 LAB — CBC WITH DIFFERENTIAL/PLATELET
Basophils Absolute: 0 x10E3/uL (ref 0.0–0.2)
Basos: 1 %
EOS (ABSOLUTE): 0.1 x10E3/uL (ref 0.0–0.4)
Eos: 2 %
Hematocrit: 42.7 % (ref 37.5–51.0)
Hemoglobin: 13.9 g/dL (ref 13.0–17.7)
Immature Grans (Abs): 0 x10E3/uL (ref 0.0–0.1)
Immature Granulocytes: 0 %
Lymphocytes Absolute: 2.4 x10E3/uL (ref 0.7–3.1)
Lymphs: 44 %
MCH: 32.9 pg (ref 26.6–33.0)
MCHC: 32.6 g/dL (ref 31.5–35.7)
MCV: 101 fL — ABNORMAL HIGH (ref 79–97)
Monocytes Absolute: 0.5 x10E3/uL (ref 0.1–0.9)
Monocytes: 9 %
Neutrophils Absolute: 2.4 x10E3/uL (ref 1.4–7.0)
Neutrophils: 44 %
Platelets: 205 x10E3/uL (ref 150–450)
RBC: 4.22 x10E6/uL (ref 4.14–5.80)
RDW: 12.9 % (ref 11.6–15.4)
WBC: 5.4 x10E3/uL (ref 3.4–10.8)

## 2023-12-20 LAB — COMPREHENSIVE METABOLIC PANEL WITH GFR
ALT: 10 IU/L (ref 0–44)
AST: 15 IU/L (ref 0–40)
Albumin: 4 g/dL (ref 3.9–4.9)
Alkaline Phosphatase: 71 IU/L (ref 44–121)
BUN/Creatinine Ratio: 10 (ref 10–24)
BUN: 16 mg/dL (ref 8–27)
Bilirubin Total: 0.5 mg/dL (ref 0.0–1.2)
CO2: 21 mmol/L (ref 20–29)
Calcium: 7.4 mg/dL — ABNORMAL LOW (ref 8.6–10.2)
Chloride: 107 mmol/L — ABNORMAL HIGH (ref 96–106)
Creatinine, Ser: 1.63 mg/dL — ABNORMAL HIGH (ref 0.76–1.27)
Globulin, Total: 2.8 g/dL (ref 1.5–4.5)
Glucose: 104 mg/dL — ABNORMAL HIGH (ref 70–99)
Potassium: 4.1 mmol/L (ref 3.5–5.2)
Sodium: 143 mmol/L (ref 134–144)
Total Protein: 6.8 g/dL (ref 6.0–8.5)
eGFR: 47 mL/min/1.73 — ABNORMAL LOW (ref >59)

## 2023-12-20 LAB — SPECIMEN STATUS REPORT

## 2023-12-20 LAB — LIPASE

## 2023-12-21 LAB — LIPASE: Lipase: 18 U/L (ref 13–78)

## 2023-12-21 LAB — SPECIMEN STATUS REPORT

## 2023-12-22 ENCOUNTER — Ambulatory Visit
Admission: RE | Admit: 2023-12-22 | Discharge: 2023-12-22 | Disposition: A | Discharge status: Home / Self Care | Source: Ambulatory Visit | Attending: Family Medicine | Admitting: Family Medicine

## 2023-12-22 DIAGNOSIS — R1013 Epigastric pain: Secondary | ICD-10-CM

## 2023-12-22 DIAGNOSIS — K7689 Other specified diseases of liver: Secondary | ICD-10-CM | POA: Diagnosis not present

## 2023-12-25 ENCOUNTER — Telehealth: Payer: Self-pay | Admitting: *Deleted

## 2023-12-25 DIAGNOSIS — R1013 Epigastric pain: Secondary | ICD-10-CM

## 2023-12-25 NOTE — Telephone Encounter (Signed)
 noted

## 2023-12-25 NOTE — Telephone Encounter (Unsigned)
 Copied from CRM [id number]. Topic: Clinical - Lab/Test Results >> Dec 25, 2023 11:59 AM Luis Reed I wrote: Reason for CRM: Patient would like a call to discuss imaging results from Friday

## 2023-12-27 ENCOUNTER — Other Ambulatory Visit: Payer: Self-pay

## 2023-12-29 ENCOUNTER — Other Ambulatory Visit: Payer: Self-pay

## 2024-01-01 ENCOUNTER — Other Ambulatory Visit: Payer: Self-pay

## 2024-01-02 DIAGNOSIS — N2581 Secondary hyperparathyroidism of renal origin: Secondary | ICD-10-CM | POA: Diagnosis not present

## 2024-01-02 DIAGNOSIS — T8611 Kidney transplant rejection: Secondary | ICD-10-CM | POA: Diagnosis not present

## 2024-01-02 DIAGNOSIS — R8279 Other abnormal findings on microbiological examination of urine: Secondary | ICD-10-CM | POA: Diagnosis not present

## 2024-01-02 DIAGNOSIS — Z9225 Personal history of immunosupression therapy: Secondary | ICD-10-CM | POA: Diagnosis not present

## 2024-01-02 DIAGNOSIS — I10 Essential (primary) hypertension: Secondary | ICD-10-CM | POA: Diagnosis not present

## 2024-01-02 DIAGNOSIS — Z940 Kidney transplant status: Secondary | ICD-10-CM | POA: Diagnosis not present

## 2024-01-02 DIAGNOSIS — Z789 Other specified health status: Secondary | ICD-10-CM | POA: Diagnosis not present

## 2024-01-02 DIAGNOSIS — B20 Human immunodeficiency virus [HIV] disease: Secondary | ICD-10-CM | POA: Diagnosis not present

## 2024-01-03 ENCOUNTER — Other Ambulatory Visit (HOSPITAL_COMMUNITY): Payer: Self-pay

## 2024-01-05 NOTE — Telephone Encounter (Signed)
 Copied from CRM [id number]. Topic: Referral - Request for Referral >> Jan 05, 2024 11:52 AM Adelita E wrote: Did the patient discuss referral with their provider in the last year? Yes (If No - schedule appointment) (If Yes - send message)  Appointment offered? No  Type of order/referral and detailed reason for visit: Gastroenterology for stomach issues. Patient stated he is not having pain, it is just bothering him.  Preference of office, provider, location: N/A - whoever provider prefers that is in-network.  If referral order, have you been seen by this specialty before? No (If Yes, this issue or another issue? When? Where?  Can we respond through MyChart? Yes

## 2024-01-17 NOTE — Telephone Encounter (Signed)
 Spoke with the patient to clarify if he is still having the same symptoms from previous visit with Philippe, NP.  Patient verified this and stated he was supposed to be scheduled for an upper and lower GI study.  Patient is aware a referral was placed to GI per test results note from NP and once he is seen by a provider there, they will determine what test is needed.

## 2024-01-17 NOTE — Addendum Note (Signed)
 Addended by: CHRISTYNE IDELL LABOR on: 01/17/2024 04:09 PM   Modules accepted: Orders

## 2024-01-17 NOTE — Telephone Encounter (Unsigned)
 Copied from CRM [id number]. Topic: Referral - Status >> Jan 17, 2024 12:07 PM Macario HERO wrote: Reason for CRM: Patient calling to speak with Dr. Heddie nurse regarding referral to see a gastroenterologist for stomach issues. Requesting a follow up.

## 2024-01-25 ENCOUNTER — Encounter: Payer: Self-pay | Admitting: Podiatry

## 2024-01-25 ENCOUNTER — Ambulatory Visit (INDEPENDENT_AMBULATORY_CARE_PROVIDER_SITE_OTHER): Admitting: Podiatry

## 2024-01-25 VITALS — Ht 68.0 in | Wt 170.0 lb

## 2024-01-25 DIAGNOSIS — L929 Granulomatous disorder of the skin and subcutaneous tissue, unspecified: Secondary | ICD-10-CM

## 2024-01-25 DIAGNOSIS — L928 Other granulomatous disorders of the skin and subcutaneous tissue: Secondary | ICD-10-CM

## 2024-01-25 NOTE — Progress Notes (Signed)
       Subjective:  Patient ID: Luis Reed, male    DOB: 02-26-1961,  MRN: [id number]  Chief Complaint  Patient presents with   Nail Problem    Pt is here to f/u on left great toenail, he states that it is not healing well, keeps toe cleaned and applies antibiotic cream with band aid.    Luis Reed presents to clinic following up with concern for the healing to the left first toe from prior total nail avulsion.  States that he has a focal spot that does not appear fully healed and he reports small amount of drainage occasionally.  He does keep the toe clean and has been applying antibiotic cream and a Band-Aid.  PCP is Ozell Heron HERO, MD.  Allergies  Allergen Reactions   Hydrocodone Itching and Other (See Comments)    fidgety    Objective:  There were no vitals filed for this visit.  Vascular Examination: Capillary refill time is less than 3 seconds to toes bilateral. Palpable pedal pulses b/l LE. Digital hair present b/l. No pedal edema b/l. Skin temperature gradient WNL b/l. No varicosities b/l. No cyanosis or clubbing noted b/l.   Dermatological Examination: Left first toe nailbed overall appears well-healed.  There is area lateral aspect of the nailbed consistent with small granuloma formation.  Mildly tender on palpation.  Radiographs: Left foot 3 views 11/16/2023 Normal osseous mineralization.  Joint spaces preserved.  No acute fractures.  No signs of osteolysis, cortical erosion or periosteal reaction to the distal phalanx of the first toe.  Assessment/Plan: 1. Granuloma of great toe     No orders of the defined types were placed in this encounter.  Clinical findings reviewed with patient.  Findings consistent with granuloma of the toe.  Verbal consent was obtained to perform chemical cautery of the lesion.  This was done with silver nitrate.  Following this, did paint the toe with Betadine and applied Band-Aid.  Encourage patient to do this 1-2 times a day.   Have him follow-up in 2 weeks to see how it is progressed.  Return in about 2 weeks (around 02/08/2024) for Granuloma toe.   Ethan LITTIE Saddler, DPM, AACFAS [facility]     [address] [place], [place] [address]                Office [phone]  Fax [phone]

## 2024-01-25 NOTE — Patient Instructions (Signed)
 Apply a small amount of Betadine/iodine solution to the spot on your toe 1-2 times a day and keep covered with Band-Aid.  You can buy the Betadine solution from the drugstore.  Contact office immediately if you notice redness, swelling, significant drainage.

## 2024-02-01 ENCOUNTER — Encounter: Payer: Self-pay | Admitting: Physician Assistant

## 2024-02-05 ENCOUNTER — Other Ambulatory Visit: Payer: Self-pay | Admitting: Podiatry

## 2024-02-08 ENCOUNTER — Encounter: Payer: Self-pay | Admitting: Podiatry

## 2024-02-08 ENCOUNTER — Ambulatory Visit: Admitting: Podiatry

## 2024-02-08 DIAGNOSIS — L929 Granulomatous disorder of the skin and subcutaneous tissue, unspecified: Secondary | ICD-10-CM | POA: Diagnosis not present

## 2024-02-08 NOTE — Progress Notes (Signed)
       Subjective:  Patient ID: Luis Reed, male    DOB: [birth year],  MRN: [id number]  Chief Complaint  Patient presents with   Nail Problem    Rm20 Follow up granuloma of left great toe/ patient says he is doing well with no complaints.    Luis Reed presents to clinic following up for left first toe granuloma.  Treated with silver nitrate last visit.  Doing well.  Denies any pain.  Feels like it is healing up well.  PCP is Ozell Heron HERO, MD.  Allergies  Allergen Reactions   Hydrocodone Itching and Other (See Comments)    fidgety    Objective:  There were no vitals filed for this visit.  Vascular Examination: Capillary refill time is less than 3 seconds to toes bilateral. Palpable pedal pulses b/l LE. Digital hair present b/l. No pedal edema b/l. Skin temperature gradient WNL b/l. No varicosities b/l. No cyanosis or clubbing noted b/l.   Dermatological Examination: Left first toe nailbed appears well-healed.  No lesions noted.  No erythema, no edema, no drainage.  Radiographs: Left foot 3 views 11/16/2023 Normal osseous mineralization.  Joint spaces preserved.  No acute fractures.  No signs of osteolysis, cortical erosion or periosteal reaction to the distal phalanx of the first toe.  Assessment/Plan: 1. Granuloma of great toe      No orders of the defined types were placed in this encounter.  Findings reviewed with patient.  Feel like we have good resolution of the left first toe granuloma.  May want to apply bandage for comfort and shoes until nailbed calluses over a bit more.  Monitor for signs of recurrence.  Return if symptoms worsen or fail to improve.   Luis Reed, DPM, AACFAS [facility]     [address] [place], [place] [address]                Office [phone]  Fax [phone]

## 2024-02-13 ENCOUNTER — Other Ambulatory Visit (HOSPITAL_COMMUNITY): Payer: Self-pay

## 2024-02-13 ENCOUNTER — Other Ambulatory Visit: Payer: Self-pay

## 2024-02-13 NOTE — Progress Notes (Signed)
 Specialty Pharmacy Refill Coordination Note  Luis Reed is a 63 y.o. male contacted today regarding refills of specialty medication(s) Dolutegravir  Sodium (Tivicay ); Emtricitabine -Tenofovir  AF (DESCOVY )   Patient requested Delivery   Delivery date: 02/16/24   Verified address: [address]  [name] [address]   Medication will be filled on 02/15/24.

## 2024-02-14 ENCOUNTER — Other Ambulatory Visit: Payer: Self-pay

## 2024-02-26 DIAGNOSIS — Z940 Kidney transplant status: Secondary | ICD-10-CM | POA: Diagnosis not present

## 2024-02-26 DIAGNOSIS — N2581 Secondary hyperparathyroidism of renal origin: Secondary | ICD-10-CM | POA: Diagnosis not present

## 2024-02-28 DIAGNOSIS — Z940 Kidney transplant status: Secondary | ICD-10-CM | POA: Diagnosis not present

## 2024-02-28 DIAGNOSIS — B1920 Unspecified viral hepatitis C without hepatic coma: Secondary | ICD-10-CM | POA: Diagnosis not present

## 2024-03-07 ENCOUNTER — Other Ambulatory Visit (HOSPITAL_COMMUNITY): Payer: Self-pay

## 2024-03-11 ENCOUNTER — Other Ambulatory Visit (HOSPITAL_COMMUNITY): Payer: Self-pay

## 2024-03-12 ENCOUNTER — Other Ambulatory Visit: Payer: Self-pay

## 2024-03-12 NOTE — Progress Notes (Signed)
 Specialty Pharmacy Refill Coordination Note  Luis Reed is a 63 y.o. male contacted today regarding refills of specialty medication(s) Dolutegravir  Sodium (Tivicay ); Emtricitabine -Tenofovir  AF (DESCOVY )   Patient requested Delivery   Delivery date: 03/15/24   Verified address: [address]  [place] [address]   Medication will be filled on: 03/14/24

## 2024-03-13 ENCOUNTER — Other Ambulatory Visit: Payer: Self-pay

## 2024-03-24 NOTE — Progress Notes (Signed)
 "     Luis Console, PA-C [address] [place], [place]  [address] Phone: [phone]   Gastroenterology Consultation  Referring Provider:     Ozell Heron HERO, MD Primary Care Physician:  Ozell Heron HERO, MD Primary Gastroenterologist:  Luis Console, PA-C / Glendia Holt, MD  Reason for Consultation:     Repeat colonoscopy, evaluate epigastric pain        HPI:   Discussed the use of AI scribe software for clinical note transcription with the patient, who gave verbal consent to proceed.  05/2002 last colonoscopy by Dr. Aneita: Moderate internal hemorrhoids, diverticulosis, otherwise normal with no polyps.  Patient saw his PCP 12/19/2023 to evaluate acute abdominal pain for 2 days.  Throbbing intermittent pain.  No nausea, vomiting, diarrhea, or fever.  Patient was having constipation.    12/19/2023 labs: CBC, CMP, lipase labs normal except CKD with BUN 16, creatinine 1.63, GFR 47.  Low calcium  7.4.  Normal LFTs.  Hgb 13.9.  12/22/2023 CT abdomen pelvis with contrast:  No acute abnormality.  History of Present Illness Luis Reed is a 63 year old male who presents for scheduling a colonoscopy and evaluation of a past episode of abdominal pain.  Approximately one month ago, he experienced an episode of abdominal pain, which he attributes to a bout of constipation possibly due to excessive calcium  pill intake. The pain was located in the lower abdomen. He used Miralax, which resolved the constipation within a few days. He has not experienced constipation or abdominal pain since then.  Currently he is feeling a lot better with no GI symptoms.  In August, he underwent a CT scan of the abdomen and pelvis, which showed normal results. Blood work done at the same time revealed a hemoglobin level of 13.9 and normal liver function tests.  He has family history of colon cancer in his father, who was diagnosed in his eighties. No blood in stool and he does not take any blood  thinners.  PMH: HIV infection, hepatitis C, hypertension, chronic kidney disease, renal transplant recipient, seizure disorder, history of CVA, history of alcohol abuse, history of tobacco use, malignant hyperthermia, parathyroidectomy.  He has had a successful kidney transplant since 2007.  Last seizure was in 2000.  Past Medical History:  Diagnosis Date   Blood transfusion without reported diagnosis    Chronic insomnia 07/18/2014   Hepatitis C    HIV infection (HCC)    HTN (hypertension) 06/26/2023   Hyperlipidemia 06/26/2023   Hypertension    Malignant hyperthermia    Renal disorder renal failure   sees Dr. Fairy Huron    Renal transplant recipient 04/19/2023   SEIZURE DISORDER 03/15/2006   Qualifier: History of  By: Elaine MD, Norleen  - pt states last seizure 2000    Past Surgical History:  Procedure Laterality Date   AV FISTULA PLACEMENT     COLONOSCOPY  2004   LIGATION OF ARTERIOVENOUS  FISTULA Left 03/16/2018   Procedure: LIGATION OF ARTERIOVENOUS  FISTULA LEFT ARM;  Surgeon: Oris Krystal FALCON, MD;  Location: [facility] OR;  Service: Vascular;  Laterality: Left;   NEPHRECTOMY TRANSPLANTED ORGAN      Prior to Admission medications   Medication Sig Start Date End Date Taking? Authorizing Provider  amLODipine  (NORVASC ) 5 MG tablet Take 1 tablet (5 mg total) by mouth daily. 05/19/23   Ozell Heron HERO, MD  atorvastatin  (LIPITOR) 10 MG tablet Take 1 tablet (10 mg total) by mouth daily. 06/27/23 06/26/24  Fleeta Rothman, Jomarie SAILOR, MD  calcitRIOL (ROCALTROL) 0.5 MCG capsule Take 0.5 mcg by mouth at bedtime. 12/19/17   [provider]  CELLCEPT 250 MG capsule Take 250 mg by mouth 2 (two) times daily. 01/29/14   [provider]  dolutegravir  (TIVICAY ) 50 MG tablet Take 1 tablet (50 mg total) by mouth daily. 06/27/23   Fleeta Rothman Jomarie SAILOR, MD  emtricitabine -tenofovir  AF (DESCOVY ) 200-25 MG tablet Take 1 tablet by mouth daily. 06/27/23   Fleeta Rothman Jomarie SAILOR, MD  methocarbamol   (ROBAXIN ) 500 MG tablet Take 1 tablet (500 mg total) by mouth at bedtime as needed for muscle spasms. 02/22/21   Blaise Aleene KIDD, MD  neomycin -polymyxin-hydrocortisone (CORTISPORIN) 3.5-10000-1 OTIC suspension APPLY 1-2 DROPS DAILY AFTER SOAKING AND COVER WITH BANDAID 02/06/24   Silva Juliene SAUNDERS, DPM  predniSONE  (DELTASONE ) 5 MG tablet Take 5 mg by mouth every Monday, Wednesday, and Friday at 8 PM.     [provider]  predniSONE  (STERAPRED UNI-PAK 21 TAB) 10 MG (21) TBPK tablet Take as directed 01/06/21   Jerri Kay HERO, MD  tacrolimus  (PROGRAF ) 1 MG capsule Take 2 mg by mouth 2 (two) times daily.     [provider]  tacrolimus  (PROGRAF ) 5 MG capsule Take 5 mg by mouth 2 (two) times daily.     [provider]  traZODone  (DESYREL ) 50 MG tablet TAKE 0.5-1 TABLETS BY MOUTH AT BEDTIME AS NEEDED FOR SLEEP. 06/15/23   Ozell Heron HERO, MD    Family History  Problem Relation Age of Onset   Prostate cancer Father 21   Colon cancer Neg Hx    Colon polyps Neg Hx    Esophageal cancer Neg Hx    Rectal cancer Neg Hx    Stomach cancer Neg Hx      Social History   Tobacco Use   Smoking status: Former    Current packs/day: 0.30    Average packs/day: 0.3 packs/day for 20.0 years (6.0 ttl pk-yrs)    Types: Cigarettes   Smokeless tobacco: Never   Tobacco comments:    quit smoking cigaettes at age 2  Vaping Use   Vaping status: Never Used  Substance Use Topics   Alcohol use: No   Drug use: No    Allergies as of 03/25/2024 - Review Complete 03/25/2024  Allergen Reaction Noted   Hydrocodone Itching and Other (See Comments)     Review of Systems:    All systems reviewed and negative except where noted in HPI.   Physical Exam:  BP 126/74   Pulse 71   Ht 5' 8 (1.727 m)   Wt 178 lb 4 oz (80.9 kg)   SpO2 96%   BMI 27.10 kg/m  No LMP for male patient.  General:   Alert,  Well-developed, well-nourished, pleasant and cooperative in NAD Lungs:  Respirations even  and unlabored.  Clear throughout to auscultation.   No wheezes, crackles, or rhonchi. No acute distress. Heart:  Regular rate and rhythm; no murmurs, clicks, rubs, or gallops. Abdomen:  Normal bowel sounds.  No bruits.  Soft, and non-distended without masses, hepatosplenomegaly or hernias noted.  No Tenderness.  No guarding or rebound tenderness.    Neurologic:  Alert and oriented x3;  grossly normal neurologically. Psych:  Alert and cooperative. Normal mood and affect.   Imaging Studies: No results found.  Labs: CBC    Component Value Date/Time   WBC 5.4 12/19/2023 0000   WBC 5.4 06/27/2023 1141   RBC 4.22  12/19/2023 0000   RBC 4.30 06/27/2023 1141   HGB 13.9 12/19/2023 0000   HCT 42.7 12/19/2023 0000   PLT 205 12/19/2023 0000   MCV 101 (H) 12/19/2023 0000    CMP     Component Value Date/Time   NA 143 12/19/2023 1538   K 4.1 12/19/2023 1538   CL 107 (H) 12/19/2023 1538   CO2 21 12/19/2023 1538   GLUCOSE 104 (H) 12/19/2023 1538   GLUCOSE 94 06/27/2023 1141   BUN 16 12/19/2023 1538   CREATININE 1.63 (H) 12/19/2023 1538   CREATININE 1.60 (H) 06/27/2023 1141   CALCIUM  7.4 (L) 12/19/2023 1538   CALCIUM  6.8 11/08/2023 0000   CALCIUM  6.9 (L) 02/01/2011 0610   PROT 6.8 12/19/2023 1538   ALBUMIN 4.0 12/19/2023 1538   AST 15 12/19/2023 1538   ALT 10 12/19/2023 1538   ALT 22 07/14/2014 1629   ALKPHOS 71 12/19/2023 1538   BILITOT 0.5 12/19/2023 1538   GFRNONAA 48 (L) 02/22/2021 1146   GFRNONAA 39 (L) 11/06/2017 1027   GFRAA 45 (L) 05/16/2019 0605   GFRAA 46 (L) 11/06/2017 1027    Assessment and Plan:   Luis Reed is a 63 y.o. y/o male has been referred for:  1.  Mild Constipation which caused abdominal pain 1 month ago: Currently Resolved.  Recent abdominal pelvic CT and labs unrevealing.  Symptoms resolved after he took MiraLAX. - Take OTC Miralax 1 capful daily as needed. - Recommend High Fiber diet with fruits, vegetables, and whole grains. - Drink 64 ounces  of Fluids Daily.  2.  Colon cancer screening: He is overdue for a 10-year repeat screening colonoscopy - Scheduling Colonoscopy I discussed risks of colonoscopy with patient to include risk of bleeding, colon perforation, and risk of sedation.  Patient expressed understanding and agrees to proceed with colonoscopy.   3. Family history of Colon Cancer in his Father age 46's. - Scheduling Colonoscopy  3.  Comorbidities:  HIV infection, hepatitis C, hypertension, chronic kidney disease, renal transplant recipient, seizure disorder, history of CVA, history of alcohol abuse, history of tobacco use, malignant hyperthermia, parathyroidectomy.  He has had a successful kidney transplant since 2007.  Last seizure was in 2000.  He last followed up with his infectious disease specialist 06/2023 and was stable.  Follow up As Needed.  Luis Console, PA-C   "

## 2024-03-25 ENCOUNTER — Ambulatory Visit: Admitting: Physician Assistant

## 2024-03-25 ENCOUNTER — Encounter: Payer: Self-pay | Admitting: Physician Assistant

## 2024-03-25 VITALS — BP 126/74 | HR 71 | Ht 68.0 in | Wt 178.2 lb

## 2024-03-25 DIAGNOSIS — Z1211 Encounter for screening for malignant neoplasm of colon: Secondary | ICD-10-CM | POA: Diagnosis not present

## 2024-03-25 DIAGNOSIS — Z800 Family history of malignant neoplasm of digestive organs: Secondary | ICD-10-CM

## 2024-03-25 DIAGNOSIS — K5900 Constipation, unspecified: Secondary | ICD-10-CM | POA: Diagnosis not present

## 2024-03-25 MED ORDER — NA SULFATE-K SULFATE-MG SULF 17.5-3.13-1.6 GM/177ML PO SOLN: 1.0000 | Freq: Once | ORAL | 0 refills | Status: AC

## 2024-03-25 NOTE — Patient Instructions (Addendum)
 You have been scheduled for a Colonoscopy. Please follow written instructions given to you at your visit today.   If you use inhalers (even only as needed), please bring them with you on the day of your procedure.  DO NOT TAKE 7 DAYS PRIOR TO TEST- Trulicity (dulaglutide) Ozempic, Wegovy (semaglutide) Mounjaro (tirzepatide) Bydureon Bcise (exanatide extended release)  DO NOT TAKE 1 DAY PRIOR TO YOUR TEST Rybelsus (semaglutide) Adlyxin (lixisenatide) Victoza (liraglutide) Byetta (exanatide) ___________________________________________________________________________  Please follow up sooner if symptoms increase or worsen   Due to recent changes in healthcare laws, you may see the results of your imaging and laboratory studies on MyChart before your provider has had a chance to review them.  We understand that in some cases there may be results that are confusing or concerning to you. Not all laboratory results come back in the same time frame and the provider may be waiting for multiple results in order to interpret others.  Please give us  48 hours in order for your provider to thoroughly review all the results before contacting the office for clarification of your results.   Thank you for trusting me with your gastrointestinal care!   Ellouise Console, PA-C _______________________________________________________  If your blood pressure at your visit was 140/90 or greater, please contact your primary care physician to follow up on this.  _______________________________________________________  If you are age 64 or older, your body mass index should be between 23-30. Your Body mass index is 27.1 kg/m. If this is out of the aforementioned range listed, please consider follow up with your Primary Care Provider.  If you are age 64 or younger, your body mass index should be between 19-25. Your Body mass index is 27.1 kg/m. If this is out of the aformentioned range listed, please consider  follow up with your Primary Care Provider.   ________________________________________________________  The [facility] providers would like to encourage you to use MYCHART to communicate with providers for non-urgent requests or questions.  Due to long hold times on the telephone, sending your provider a message by [facility] may be a faster and more efficient way to get a response.  Please allow 48 business hours for a response.  Please remember that this is for non-urgent requests.  _______________________________________________________

## 2024-04-01 ENCOUNTER — Other Ambulatory Visit: Payer: Self-pay

## 2024-04-01 NOTE — Progress Notes (Signed)
 Specialty Pharmacy Ongoing Clinical Assessment Note  Luis Reed is a 63 y.o. male who is being followed by the specialty pharmacy service for RxSp HIV   Patient's specialty medication(s) reviewed today: Dolutegravir  Sodium (Tivicay ); Emtricitabine -Tenofovir  AF (DESCOVY )   Missed doses in the last 4 weeks: 0   Patient/Caregiver did not have any additional questions or concerns.   Therapeutic benefit summary: Patient is achieving benefit   Adverse events/side effects summary: No adverse events/side effects   Patient's therapy is appropriate to: Continue    Goals Addressed             This Visit's Progress    Achieve Undetectable HIV Viral Load < 20   On track    Patient is on track. Patient will work on increased adherence      Increase CD4 count until steady state   On track    Patient is on track. Patient will work on increased adherence      Maintain optimal adherence to therapy       Patient is on track. Patient will maintain adherence          Follow up: 12 months  Kaitelyn Jamison M Afton Mikelson Specialty Pharmacist

## 2024-04-01 NOTE — Progress Notes (Signed)
 Specialty Pharmacy Refill Coordination Note  Luis Reed is a 63 y.o. male contacted today regarding refills of specialty medication(s) Emtricitabine -Tenofovir  AF (DESCOVY ); Dolutegravir  Sodium (Tivicay )   Patient requested Delivery   Delivery date: 04/10/24   Verified address: [address]  [place] [address]   Medication will be filled on: 04/09/24

## 2024-04-09 ENCOUNTER — Other Ambulatory Visit: Payer: Self-pay

## 2024-04-11 ENCOUNTER — Ambulatory Visit

## 2024-04-11 VITALS — Ht 68.0 in | Wt 172.0 lb

## 2024-04-11 DIAGNOSIS — Z0000 Encounter for general adult medical examination without abnormal findings: Secondary | ICD-10-CM | POA: Diagnosis not present

## 2024-04-11 NOTE — Patient Instructions (Signed)
 Mr. Demartini,  Thank you for taking the time for your Medicare Wellness Visit. I appreciate your continued commitment to your health goals. Please review the care plan we discussed, and feel free to reach out if I can assist you further.  Please note that Annual Wellness Visits do not include a physical exam. Some assessments may be limited, especially if the visit was conducted virtually. If needed, we may recommend an in-person follow-up with your provider.  Ongoing Care Seeing your primary care provider every 3 to 6 months helps us  monitor your health and provide consistent, personalized care.   Referrals If a referral was made during today's visit and you haven't received any updates within two weeks, please contact the referred provider directly to check on the status.  Recommended Screenings:  Health Maintenance  Topic Date Due   DTaP/Tdap/Td vaccine (1 - Tdap) Never done   Zoster (Shingles) Vaccine (1 of 2) Never done   Hepatitis B Vaccine (1 of 3 - Risk 3-dose series) Never done   Colon Cancer Screening  09/08/2019   Flu Shot  12/08/2023   COVID-19 Vaccine (4 - 2025-26 season) 01/08/2024   Medicare Annual Wellness Visit  03/20/2024   Pneumococcal Vaccine for age over 35  Completed   Hepatitis C Screening  Completed   HIV Screening  Completed   Meningitis B Vaccine  Aged Out   Cologuard (Stool DNA test)  Discontinued       04/11/2024    8:11 AM  Advanced Directives  Does Patient Have a Medical Advance Directive? No  Would patient like information on creating a medical advance directive? No - Patient declined    Vision: Annual vision screenings are recommended for early detection of glaucoma, cataracts, and diabetic retinopathy. These exams can also reveal signs of chronic conditions such as diabetes and high blood pressure.  Dental: Annual dental screenings help detect early signs of oral cancer, gum disease, and other conditions linked to overall health, including heart  disease and diabetes.  Please see the attached documents for additional preventive care recommendations.

## 2024-04-11 NOTE — Progress Notes (Signed)
 Agree with the assessment and plan as outlined by Brigitte Canard, PA-C.

## 2024-04-11 NOTE — Progress Notes (Signed)
 Chief Complaint  Patient presents with   Medicare Wellness     Subjective:   Luis Reed is a 63 y.o. male who presents for a Medicare Annual Wellness Visit.  Visit info / Clinical Intake: Medicare Wellness Visit Type:: Subsequent Annual Wellness Visit Persons participating in visit and providing information:: patient Medicare Wellness Visit Mode:: Video Since this visit was completed virtually, some vitals may be partially provided or unavailable. Missing vitals are due to the limitations of the virtual format.: Documented vitals are patient reported If Telephone or Video please confirm:: I connected with patient using audio/video enable telemedicine. I verified patient identity with two identifiers, discussed telehealth limitations, and patient agreed to proceed. Patient Location:: home Provider Location:: home office Interpreter Needed?: No Pre-visit prep was completed: yes AWV questionnaire completed by patient prior to visit?: no Living arrangements:: lives with spouse/significant other Patient's Overall Health Status Rating: good Typical amount of pain: none Does pain affect daily life?: no Are you currently prescribed opioids?: no  Dietary Habits and Nutritional Risks How many meals a day?: 3 Eats fruit and vegetables daily?: yes Most meals are obtained by: preparing own meals In the last 2 weeks, have you had any of the following?: none Diabetic:: no  Functional Status Activities of Daily Living (to include ambulation/medication): Independent Ambulation: Independent Medication Administration: Independent Home Management (perform basic housework or laundry): Independent Manage your own finances?: yes Primary transportation is: driving Concerns about vision?: no *vision screening is required for WTM* Concerns about hearing?: no  Fall Screening Falls in the past year?: 0 Number of falls in past year: 0 Was there an injury with Fall?: 0 Fall Risk Category  Calculator: 0 Patient Fall Risk Level: Low Fall Risk  Fall Risk Patient at Risk for Falls Due to: No Fall Risks Fall risk Follow up: Falls prevention discussed; Falls evaluation completed  Home and Transportation Safety: All rugs have non-skid backing?: N/A, no rugs All stairs or steps have railings?: N/A, no stairs Grab bars in the bathtub or shower?: (!) no Have non-skid surface in bathtub or shower?: yes Good home lighting?: yes Regular seat belt use?: yes Hospital stays in the last year:: no  Cognitive Assessment Difficulty concentrating, remembering, or making decisions? : no Will 6CIT or Mini Cog be Completed: yes What year is it?: 0 points What month is it?: 0 points Give patient an address phrase to remember (5 components): [address] About what time is it?: 0 points Count backwards from 20 to 1: 0 points Say the months of the year in reverse: 4 points Repeat the address phrase from earlier: 2 points 6 CIT Score: 6 points  Advance Directives (For Healthcare) Does Patient Have a Medical Advance Directive?: No Would patient like information on creating a medical advance directive?: No - Patient declined  Reviewed/Updated  Reviewed/Updated: Reviewed All (Medical, Surgical, Family, Medications, Allergies, Care Teams, Patient Goals)    Allergies (verified) Hydrocodone   Current Medications (verified) Outpatient Encounter Medications as of 04/11/2024  Medication Sig   amLODipine  (NORVASC ) 5 MG tablet Take 1 tablet (5 mg total) by mouth daily.   atorvastatin  (LIPITOR) 10 MG tablet Take 1 tablet (10 mg total) by mouth daily.   calcitRIOL  (ROCALTROL ) 0.5 MCG capsule Take 0.5 mcg by mouth at bedtime.   CELLCEPT  250 MG capsule Take 250 mg by mouth 2 (two) times daily.   dolutegravir  (TIVICAY ) 50 MG tablet Take 1 tablet (50 mg total) by mouth daily.   emtricitabine -tenofovir   AF (DESCOVY ) 200-25 MG tablet Take 1 tablet by mouth daily.   methocarbamol  (ROBAXIN ) 500  MG tablet Take 1 tablet (500 mg total) by mouth at bedtime as needed for muscle spasms.   neomycin -polymyxin-hydrocortisone (CORTISPORIN) 3.5-10000-1 OTIC suspension APPLY 1-2 DROPS DAILY AFTER SOAKING AND COVER WITH BANDAID   predniSONE  (DELTASONE ) 5 MG tablet Take 5 mg by mouth every Monday, Wednesday, and Friday at 8 PM.    tacrolimus  (PROGRAF ) 1 MG capsule Take 2 mg by mouth 2 (two) times daily.    tacrolimus  (PROGRAF ) 5 MG capsule Take 5 mg by mouth 2 (two) times daily.    traZODone  (DESYREL ) 50 MG tablet TAKE 0.5-1 TABLETS BY MOUTH AT BEDTIME AS NEEDED FOR SLEEP.   predniSONE  (STERAPRED UNI-PAK 21 TAB) 10 MG (21) TBPK tablet Take as directed (Patient not taking: Reported on 04/11/2024)   Facility-Administered Encounter Medications as of 04/11/2024  Medication   betamethasone  acetate-betamethasone  sodium phosphate (CELESTONE ) injection 3 mg    History: Past Medical History:  Diagnosis Date   Blood transfusion without reported diagnosis    Chronic insomnia 07/18/2014   Hepatitis C    HIV infection (HCC)    HTN (hypertension) 06/26/2023   Hyperlipidemia 06/26/2023   Hypertension    Malignant hyperthermia    Renal disorder renal failure   sees Dr. Fairy Huron    Renal transplant recipient 04/19/2023   SEIZURE DISORDER 03/15/2006   Qualifier: History of  By: Elaine MD, Norleen  - pt states last seizure 2000   Past Surgical History:  Procedure Laterality Date   AV FISTULA PLACEMENT     COLONOSCOPY  2004   LIGATION OF ARTERIOVENOUS  FISTULA Left 03/16/2018   Procedure: LIGATION OF ARTERIOVENOUS  FISTULA LEFT ARM;  Surgeon: Oris Krystal FALCON, MD;  Location: [facility] OR;  Service: Vascular;  Laterality: Left;   NEPHRECTOMY TRANSPLANTED ORGAN     Family History  Problem Relation Age of Onset   Prostate cancer Father 25   Colon cancer Neg Hx    Colon polyps Neg Hx    Esophageal cancer Neg Hx    Rectal cancer Neg Hx    Stomach cancer Neg Hx    Social History   Occupational History    Occupation: Disabled    Comment: Kidney disease   Tobacco Use   Smoking status: Former    Current packs/day: 0.30    Average packs/day: 0.3 packs/day for 20.0 years (6.0 ttl pk-yrs)    Types: Cigarettes   Smokeless tobacco: Never   Tobacco comments:    quit smoking cigaettes at age 55  Vaping Use   Vaping status: Never Used  Substance and Sexual Activity   Alcohol use: No   Drug use: No   Sexual activity: Not Currently    Partners: Female   Tobacco Counseling Counseling given: Not Answered Tobacco comments: quit smoking cigaettes at age 34  SDOH Screenings   Food Insecurity: No Food Insecurity (04/11/2024)  Housing: Unknown (04/11/2024)  Transportation Needs: No Transportation Needs (04/11/2024)  Utilities: Not At Risk (04/11/2024)  Alcohol Screen: Low Risk  (04/11/2024)  Depression (PHQ2-9): Low Risk  (04/11/2024)  Financial Resource Strain: Low Risk  (04/11/2024)  Physical Activity: Sufficiently Active (04/11/2024)  Social Connections: Moderately Integrated (04/11/2024)  Stress: No Stress Concern Present (04/11/2024)  Tobacco Use: Medium Risk (04/11/2024)  Health Literacy: Adequate Health Literacy (04/11/2024)   See flowsheets for full screening details  Depression Screen PHQ 2 & 9 Depression Scale- Over the past 2 weeks, how often have  you been bothered by any of the following problems? Little interest or pleasure in doing things: 0 Feeling down, depressed, or hopeless (PHQ Adolescent also includes...irritable): 0 PHQ-2 Total Score: 0 Trouble falling or staying asleep, or sleeping too much: 0 Feeling tired or having little energy: 0 Poor appetite or overeating (PHQ Adolescent also includes...weight loss): 0 Feeling bad about yourself - or that you are a failure or have let yourself or your family down: 0 Trouble concentrating on things, such as reading the newspaper or watching television (PHQ Adolescent also includes...like school work): 0 Moving or speaking so slowly that  other people could have noticed. Or the opposite - being so fidgety or restless that you have been moving around a lot more than usual: 0 Thoughts that you would be better off dead, or of hurting yourself in some way: 0 PHQ-9 Total Score: 0 If you checked off any problems, how difficult have these problems made it for you to do your work, take care of things at home, or get along with other people?: Not difficult at all  Depression Treatment Depression Interventions/Treatment : EYV7-0 Score <4 Follow-up Not Indicated     Goals Addressed             This Visit's Progress    Patient Stated       04/11/2024, denies goals             Objective:    Today's Vitals   04/11/24 0806  Weight: 172 lb (78 kg)  Height: 5' 8 (1.727 m)   Body mass index is 26.15 kg/m.  Hearing/Vision screen Hearing Screening - Comments:: Denies hearing issues Vision Screening - Comments:: Regular eye exams, [facility] Immunizations and Health Maintenance Health Maintenance  Topic Date Due   DTaP/Tdap/Td (1 - Tdap) Never done   Zoster Vaccines- Shingrix  (1 of 2) Never done   Hepatitis B Vaccines 19-59 Average Risk (1 of 3 - Risk 3-dose series) Never done   Colonoscopy  09/08/2019   Influenza Vaccine  12/08/2023   COVID-19 Vaccine (4 - 2025-26 season) 01/08/2024   Medicare Annual Wellness (AWV)  04/11/2025   Pneumococcal Vaccine: 50+ Years  Completed   Hepatitis C Screening  Completed   HIV Screening  Completed   Meningococcal B Vaccine  Aged Out   Fecal DNA (Cologuard)  Discontinued        Assessment/Plan:  This is a routine wellness examination for Luis Reed.  Patient Care Team: Ozell Heron HERO, MD as PCP - General (Family Medicine) Rayburn Pac, MD as Consulting Physician (Nephrology)  I have personally reviewed and noted the following in the patient's chart:   Medical and social history Use of alcohol, tobacco or illicit drugs  Current medications and supplements including  opioid prescriptions. Functional ability and status Nutritional status Physical activity Advanced directives List of other physicians Hospitalizations, surgeries, and ER visits in previous 12 months Vitals Screenings to include cognitive, depression, and falls Referrals and appointments  No orders of the defined types were placed in this encounter.  In addition, I have reviewed and discussed with patient certain preventive protocols, quality metrics, and best practice recommendations. A written personalized care plan for preventive services as well as general preventive health recommendations were provided to patient.   Ardella FORBES Dawn, LPN   87/09/7972   Return in 1 year (on 04/11/2025).  After Visit Summary: (MyChart) Due to this being a telephonic visit, the after visit summary with patients personalized plan was offered to patient via  MyChart   Nurse Notes: Colonoscopy scheduled for the 9th. Due for flu. Covid and TDAP vaccines.

## 2024-04-15 DIAGNOSIS — B20 Human immunodeficiency virus [HIV] disease: Secondary | ICD-10-CM | POA: Diagnosis not present

## 2024-04-15 DIAGNOSIS — I10 Essential (primary) hypertension: Secondary | ICD-10-CM | POA: Diagnosis not present

## 2024-04-15 DIAGNOSIS — N2581 Secondary hyperparathyroidism of renal origin: Secondary | ICD-10-CM | POA: Diagnosis not present

## 2024-04-15 DIAGNOSIS — Z789 Other specified health status: Secondary | ICD-10-CM | POA: Diagnosis not present

## 2024-04-15 DIAGNOSIS — Z9225 Personal history of immunosupression therapy: Secondary | ICD-10-CM | POA: Diagnosis not present

## 2024-04-15 DIAGNOSIS — T8611 Kidney transplant rejection: Secondary | ICD-10-CM | POA: Diagnosis not present

## 2024-04-15 DIAGNOSIS — R8279 Other abnormal findings on microbiological examination of urine: Secondary | ICD-10-CM | POA: Diagnosis not present

## 2024-04-15 DIAGNOSIS — Z940 Kidney transplant status: Secondary | ICD-10-CM | POA: Diagnosis not present

## 2024-04-16 ENCOUNTER — Ambulatory Visit: Admitting: Gastroenterology

## 2024-04-16 ENCOUNTER — Encounter: Payer: Self-pay | Admitting: Gastroenterology

## 2024-04-16 VITALS — BP 141/75 | HR 56 | Temp 98.1°F | Resp 13 | Ht 68.0 in | Wt 178.4 lb

## 2024-04-16 DIAGNOSIS — D122 Benign neoplasm of ascending colon: Secondary | ICD-10-CM | POA: Diagnosis not present

## 2024-04-16 DIAGNOSIS — D127 Benign neoplasm of rectosigmoid junction: Secondary | ICD-10-CM | POA: Diagnosis not present

## 2024-04-16 DIAGNOSIS — Z1211 Encounter for screening for malignant neoplasm of colon: Secondary | ICD-10-CM

## 2024-04-16 DIAGNOSIS — D125 Benign neoplasm of sigmoid colon: Secondary | ICD-10-CM

## 2024-04-16 MED ORDER — SODIUM CHLORIDE 0.9 % IV SOLN: 500.0000 mL | INTRAVENOUS | Status: DC

## 2024-04-16 NOTE — Op Note (Signed)
 [facility] Patient Name: Luis Reed Procedure Date: 04/16/2024 2:20 PM MRN: [id number] Endoscopist: Glendia E. Stacia , MD, [id number] Age: 63 Referring MD:  Date of Birth: 02/16/1961 Gender: Male Account #: [id number] Procedure:                Colonoscopy Indications:              Screening in patient at increased risk: Colorectal                            cancer in father 40 or older; Last colonoscopy 20                            years ago Medicines:                Monitored Anesthesia Care Procedure:                Pre-Anesthesia Assessment:                           - Prior to the procedure, a History and Physical                            was performed, and patient medications and                            allergies were reviewed. The patient's tolerance of                            previous anesthesia was also reviewed. The risks                            and benefits of the procedure and the sedation                            options and risks were discussed with the patient.                            All questions were answered, and informed consent                            was obtained. Prior Anticoagulants: The patient has                            taken no anticoagulant or antiplatelet agents. ASA                            Grade Assessment: II - A patient with mild systemic                            disease. After reviewing the risks and benefits,                            the patient was deemed in satisfactory condition to  undergo the procedure.                           After obtaining informed consent, the colonoscope                            was passed under direct vision. Throughout the                            procedure, the patient's blood pressure, pulse, and                            oxygen saturations were monitored continuously. The                            CF [id number] #[id number] was introduced through  the anus                            and advanced to the the cecum, identified by                            appendiceal orifice and ileocecal valve. The                            colonoscopy was technically difficult and complex                            due to restricted mobility of the colon, a tortuous                            colon and the patient's discomfort during the                            procedure (repeated retching throughout the                            procedure). Successful completion of the procedure                            was aided by using manual pressure and withdrawing                            and reinserting the scope. The patient tolerated                            the procedure fairly well. The quality of the bowel                            preparation was good. The ileocecal valve,                            appendiceal orifice, and rectum were photographed.  The bowel preparation used was SUPREP via split                            dose instruction. Scope In: 2:35:00 PM Scope Out: 3:05:22 PM Scope Withdrawal Time: 0 hours 24 minutes 9 seconds  Total Procedure Duration: 0 hours 30 minutes 22 seconds  Findings:                 The perianal and digital rectal examinations were                            normal. Pertinent negatives include normal                            sphincter tone and no palpable rectal lesions.                           A 15 mm polyp was found in the ascending colon. The                            polyp was sessile. The polyp was removed with a                            piecemeal technique using a cold snare. Resection                            and retrieval were complete. Estimated blood loss                            was minimal.                           A 3 mm polyp was found in the sigmoid colon. The                            polyp was sessile. The polyp was removed with a                             cold snare. Resection and retrieval were complete.                            Estimated blood loss was minimal.                           A 5 mm polyp was found in the rectum. The polyp was                            sessile. The polyp was removed with a cold snare.                            Resection and retrieval were complete. Estimated                            blood  loss was minimal.                           Multiple small-mouthed diverticula were found in                            the sigmoid colon, descending colon and transverse                            colon.                           The exam was otherwise normal throughout the                            examined colon.                           Non-bleeding internal hemorrhoids were found during                            retroflexion. The hemorrhoids were Grade I                            (internal hemorrhoids that do not prolapse).                           No additional abnormalities were found on                            retroflexion. Complications:            No immediate complications. Estimated Blood Loss:     Estimated blood loss was minimal. Impression:               - One 15 mm polyp in the ascending colon, removed                            piecemeal using a cold snare. Resected and                            retrieved.                           - One 3 mm polyp in the sigmoid colon, removed with                            a cold snare. Resected and retrieved.                           - One 5 mm polyp in the rectum, removed with a cold                            snare. Resected and retrieved.                           - Mild diverticulosis in  the sigmoid colon, in the                            descending colon and in the transverse colon.                           - Non-bleeding internal hemorrhoids. Recommendation:           - Patient has a contact number available for                             emergencies. The signs and symptoms of potential                            delayed complications were discussed with the                            patient. Return to normal activities tomorrow.                            Written discharge instructions were provided to the                            patient.                           - Resume previous diet.                           - Continue present medications.                           - Await pathology results.                           - Repeat colonoscopy (date not yet determined) for                            surveillance based on pathology results. Tymeka Privette E. Stacia, MD 04/16/2024 3:18:00 PM This report has been signed electronically.

## 2024-04-16 NOTE — Progress Notes (Signed)
 History and Physical Interval Note:  04/16/2024 2:25 PM  Luis Reed  has presented today for endoscopic procedure(s), with the diagnosis of  Encounter Diagnosis  Name Primary?   Colon cancer screening Yes  .  The various methods of evaluation and treatment have been discussed with the patient and/or family. After consideration of risks, benefits and other options for treatment, the patient has consented to  the endoscopic procedure(s).   The patient's history has been reviewed, patient examined, no change in status, stable for endoscopic procedure(s).  I have reviewed the patient's chart and labs.  Questions were answered to the patient's satisfaction.     Lilana Blasko E. Stacia, MD [facility]

## 2024-04-16 NOTE — Progress Notes (Signed)
 Called to room to assist during endoscopic procedure.  Patient ID and intended procedure confirmed with present staff. Received instructions for my participation in the procedure from the performing physician.

## 2024-04-16 NOTE — Progress Notes (Signed)
 Sedate, gd SR, tolerated procedure well, VSS, report to RN

## 2024-04-16 NOTE — Patient Instructions (Signed)
 Resume previous diet.  Continue present medications. Awaiting pathology results.  Repeat colonoscopy (date not yet determined) for surveillance based on pathology results. Handouts provided on polyps, diverticulosis, and hemorrhoids.   YOU HAD AN ENDOSCOPIC PROCEDURE TODAY AT THE [facility]:   Refer to the procedure report that was given to you for any specific questions about what was found during the examination.  If the procedure report does not answer your questions, please call your gastroenterologist to clarify.  If you requested that your care partner not be given the details of your procedure findings, then the procedure report has been included in a sealed envelope for you to review at your convenience later.  YOU SHOULD EXPECT: Some feelings of bloating in the abdomen. Passage of more gas than usual.  Walking can help get rid of the air that was put into your GI tract during the procedure and reduce the bloating. If you had a lower endoscopy (such as a colonoscopy or flexible sigmoidoscopy) you may notice spotting of blood in your stool or on the toilet paper. If you underwent a bowel prep for your procedure, you may not have a normal bowel movement for a few days.  Please Note:  You might notice some irritation and congestion in your nose or some drainage.  This is from the oxygen used during your procedure.  There is no need for concern and it should clear up in a day or so.  SYMPTOMS TO REPORT IMMEDIATELY:  Following lower endoscopy (colonoscopy or flexible sigmoidoscopy):  Excessive amounts of blood in the stool  Significant tenderness or worsening of abdominal pains  Swelling of the abdomen that is new, acute  Fever of 100F or higher  For urgent or emergent issues, a gastroenterologist can be reached at any hour by calling [phone]. Do not use MyChart messaging for urgent concerns.    DIET:  We do recommend a small meal at first, but then you may proceed to  your regular diet.  Drink plenty of fluids but you should avoid alcoholic beverages for 24 hours.  ACTIVITY:  You should plan to take it easy for the rest of today and you should NOT DRIVE or use heavy machinery until tomorrow (because of the sedation medicines used during the test).    FOLLOW UP: Our staff will call the number listed on your records the next business day following your procedure.  We will call around 7:15- 8:00 am to check on you and address any questions or concerns that you may have regarding the information given to you following your procedure. If we do not reach you, we will leave a message.     If any biopsies were taken you will be contacted by phone or by letter within the next 1-3 weeks.  Please call us  at [phone] if you have not heard about the biopsies in 3 weeks.    SIGNATURES/CONFIDENTIALITY: You and/or your care partner have signed paperwork which will be entered into your electronic medical record.  These signatures attest to the fact that that the information above on your After Visit Summary has been reviewed and is understood.  Full responsibility of the confidentiality of this discharge information lies with you and/or your care-partner.

## 2024-04-17 ENCOUNTER — Telehealth: Payer: Self-pay

## 2024-04-17 NOTE — Addendum Note (Signed)
 Addended by: Christi Wirick on: 04/17/2024 08:50 AM   Modules accepted: Orders

## 2024-04-17 NOTE — Telephone Encounter (Signed)
 No answer after follow up call. Voice message left.

## 2024-04-19 LAB — SURGICAL PATHOLOGY

## 2024-04-24 ENCOUNTER — Ambulatory Visit: Payer: Self-pay | Admitting: Gastroenterology

## 2024-04-24 NOTE — Progress Notes (Signed)
 Luis Reed,   Two of the three polyps that I removed during your recent procedure were completely benign but were proven to be pre-cancerous polyps that MAY have grown into cancers if they had not been removed. One polyp was a completely growth called a leiomyoma  Studies shows that at least 20% of women over age 65 and 30% of men over age 62 have pre-cancerous polyps.  Based on current nationally recognized surveillance guidelines, I recommend that you have a repeat colonoscopy in 3 years.   If you develop any new rectal bleeding, abdominal pain or significant bowel habit changes, please contact me before then.

## 2024-05-01 ENCOUNTER — Other Ambulatory Visit: Payer: Self-pay

## 2024-05-03 ENCOUNTER — Other Ambulatory Visit: Payer: Self-pay

## 2024-05-03 ENCOUNTER — Other Ambulatory Visit (HOSPITAL_COMMUNITY): Payer: Self-pay

## 2024-05-07 ENCOUNTER — Other Ambulatory Visit (HOSPITAL_COMMUNITY): Payer: Self-pay

## 2024-05-10 ENCOUNTER — Other Ambulatory Visit: Payer: Self-pay

## 2024-05-14 NOTE — Progress Notes (Signed)
 The ASCVD Risk score (Arnett DK, et al., 2019) failed to calculate for the following reasons:   The valid total cholesterol range is 130 to 320 mg/dL  Luis Reed, BSN, RN

## 2024-05-16 ENCOUNTER — Emergency Department (HOSPITAL_COMMUNITY)
Admission: EM | Admit: 2024-05-16 | Discharge: 2024-05-17 | Disposition: A | Discharge status: Home / Self Care | Attending: Emergency Medicine | Admitting: Emergency Medicine

## 2024-05-16 ENCOUNTER — Emergency Department (HOSPITAL_COMMUNITY)

## 2024-05-16 DIAGNOSIS — R09A2 Foreign body sensation, throat: Secondary | ICD-10-CM | POA: Insufficient documentation

## 2024-05-16 LAB — I-STAT CHEM 8, ED
BUN: 14 mg/dL (ref 8–23)
Calcium, Ion: 0.91 mmol/L — ABNORMAL LOW (ref 1.15–1.40)
Chloride: 99 mmol/L (ref 98–111)
Creatinine, Ser: 1.7 mg/dL — ABNORMAL HIGH (ref 0.61–1.24)
Glucose, Bld: 80 mg/dL (ref 70–99)
HCT: 45 % (ref 39.0–52.0)
Hemoglobin: 15.3 g/dL (ref 13.0–17.0)
Potassium: 4.1 mmol/L (ref 3.5–5.1)
Sodium: 141 mmol/L (ref 135–145)
TCO2: 26 mmol/L (ref 22–32)

## 2024-05-16 LAB — CBC WITH DIFFERENTIAL/PLATELET
Abs Immature Granulocytes: 0.01 K/uL (ref 0.00–0.07)
Basophils Absolute: 0 K/uL (ref 0.0–0.1)
Basophils Relative: 1 %
Eosinophils Absolute: 0.1 K/uL (ref 0.0–0.5)
Eosinophils Relative: 1 %
HCT: 43.7 % (ref 39.0–52.0)
Hemoglobin: 14.9 g/dL (ref 13.0–17.0)
Immature Granulocytes: 0 %
Lymphocytes Relative: 48 %
Lymphs Abs: 2.8 K/uL (ref 0.7–4.0)
MCH: 33 pg (ref 26.0–34.0)
MCHC: 34.1 g/dL (ref 30.0–36.0)
MCV: 96.7 fL (ref 80.0–100.0)
Monocytes Absolute: 0.5 K/uL (ref 0.1–1.0)
Monocytes Relative: 8 %
Neutro Abs: 2.4 K/uL (ref 1.7–7.7)
Neutrophils Relative %: 42 %
Platelets: 201 K/uL (ref 150–400)
RBC: 4.52 MIL/uL (ref 4.22–5.81)
RDW: 11.9 % (ref 11.5–15.5)
WBC: 5.7 K/uL (ref 4.0–10.5)
nRBC: 0 % (ref 0.0–0.2)

## 2024-05-16 NOTE — ED Provider Triage Note (Addendum)
 Emergency Medicine Provider Triage Evaluation Note  Luis Reed , a 64 y.o. male  was evaluated in triage.  Pt reports having fish 2 days ago and thinks there is a fishbone stuck on the left side of his mouth.  Denies trouble swallowing or trouble breathing.  Able to control secretions.  Denies numbness, tingling, weakness.  Still able to drink and eat without difficulty.  Denies globus sensation.    Physical Exam  BP (!) 150/93 (BP Location: Right Arm)   Pulse 65   Temp 98.6 F (37 C) (Oral)   Resp 18   SpO2 97%  Gen:   Awake, no distress   Resp:  Normal effort clear lungs MSK:   Moves extremities without difficulty  Other:  Airway intact, no obvious foreign body palpated or visualized in mouth.  Medical Decision Making  Medically screening exam initiated at 7:17 PM.  Appropriate orders placed.  Ryan LITTIE Miu was informed that the remainder of the evaluation will be completed by another provider, this initial triage assessment does not replace that evaluation, and the importance of remaining in the ED until their evaluation is complete.  CT soft tissue neck with contrast   Guilford Shannahan, PA-C 05/16/24 1909    Tiquan Bouch, PA-C 05/16/24 1917    Odell Fasching, PA-C 05/16/24 1921

## 2024-05-16 NOTE — ED Provider Notes (Signed)
 " [place] EMERGENCY DEPARTMENT AT [facility] Provider Note   CSN: [id number] Arrival date & time: 05/16/24  8176     Patient presents with: Foreign Body   Luis Reed is a 65 y.o. male.   HPI Patient presents with concern of foreign body sensation in the left side of his tongue.  He states that he was eating fish 2 days ago, since that time is felt as though there was something abnormal stuck in his tongue.  No throat discomfort nor foreign body sensation there, no dyspnea, chest pain, fever, nausea, vomiting.    Prior to Admission medications  Medication Sig Start Date End Date Taking? Authorizing Provider  amLODipine  (NORVASC ) 5 MG tablet Take 1 tablet (5 mg total) by mouth daily. 05/19/23   Ozell Heron HERO, MD  atorvastatin  (LIPITOR) 10 MG tablet Take 1 tablet (10 mg total) by mouth daily. 06/27/23 06/26/24  Fleeta Kathie Jomarie LOISE, MD  calcitRIOL (ROCALTROL) 0.5 MCG capsule Take 0.5 mcg by mouth at bedtime. 12/19/17   [provider]  CELLCEPT 250 MG capsule Take 250 mg by mouth 2 (two) times daily. 01/29/14   [provider]  dolutegravir  (TIVICAY ) 50 MG tablet Take 1 tablet (50 mg total) by mouth daily. 06/27/23   Fleeta Kathie Jomarie LOISE, MD  emtricitabine -tenofovir  AF (DESCOVY ) 200-25 MG tablet Take 1 tablet by mouth daily. 06/27/23   Fleeta Kathie Jomarie LOISE, MD  methocarbamol  (ROBAXIN ) 500 MG tablet Take 1 tablet (500 mg total) by mouth at bedtime as needed for muscle spasms. 02/22/21   LampteyAleene KIDD, MD  neomycin -polymyxin-hydrocortisone (CORTISPORIN) 3.5-10000-1 OTIC suspension APPLY 1-2 DROPS DAILY AFTER SOAKING AND COVER WITH BANDAID 02/06/24   Silva Juliene SAUNDERS, DPM  predniSONE  (DELTASONE ) 5 MG tablet Take 5 mg by mouth every Monday, Wednesday, and Friday at 8 PM.     [provider]  predniSONE  (STERAPRED UNI-PAK 21 TAB) 10 MG (21) TBPK tablet Take as directed Patient not taking: No sig reported 01/06/21   Jerri Kay HERO, MD  tacrolimus   (PROGRAF ) 1 MG capsule Take 2 mg by mouth 2 (two) times daily.     [provider]  tacrolimus  (PROGRAF ) 5 MG capsule Take 5 mg by mouth 2 (two) times daily.     [provider]  traZODone  (DESYREL ) 50 MG tablet TAKE 0.5-1 TABLETS BY MOUTH AT BEDTIME AS NEEDED FOR SLEEP. 06/15/23   Ozell Heron HERO, MD    Allergies: Hydrocodone    Review of Systems  Updated Vital Signs BP (!) 149/83 (BP Location: Right Arm)   Pulse 62   Temp 98 F (36.7 C)   Resp 18   SpO2 100%   Physical Exam Vitals and nursing note reviewed.  Constitutional:      General: He is not in acute distress.    Appearance: He is well-developed.  HENT:     Head: Normocephalic and atraumatic.     Mouth/Throat:   Eyes:     Conjunctiva/sclera: Conjunctivae normal.  Neck:   Cardiovascular:     Rate and Rhythm: Normal rate and regular rhythm.  Pulmonary:     Effort: Pulmonary effort is normal. No respiratory distress.     Breath sounds: No stridor.  Abdominal:     General: There is no distension.  Skin:    General: Skin is warm and dry.  Neurological:     Mental Status: He is alert and oriented to person, place, and time.     (all labs  ordered are listed, but only abnormal results are displayed) Labs Reviewed  I-STAT CHEM 8, ED - Abnormal; Notable for the following components:      Result Value   Creatinine, Ser 1.70 (*)    Calcium , Ion 0.91 (*)    All other components within normal limits  CBC WITH DIFFERENTIAL/PLATELET    EKG: None  Radiology: CT Soft Tissue Neck Wo Contrast Result Date: 05/16/2024 EXAM: CT NECK WITHOUT CONTRAST 05/16/2024 10:45:09 PM TECHNIQUE: CT of the neck was performed without the administration of intravenous contrast. Multiplanar reformatted images are provided for review. Automated exposure control, iterative reconstruction, and/or weight based adjustment of the mA/kV was utilized to reduce the radiation dose to as low as reasonably achievable. COMPARISON:  None available. CLINICAL HISTORY: poss FB posterior tongue FINDINGS: AERODIGESTIVE TRACT: No discrete mass. No edema. SALIVARY GLANDS: The parotid and submandibular glands are unremarkable. THYROID : Postoperative changes and clips. LYMPH NODES: No suspicious cervical lymphadenopathy. SOFT TISSUES: No mass or fluid collection. BONES: No abnormality. OTHER: Visualized sinuses and mastoid air cells are well aerated. Visualized lungs are clear. IMPRESSION: 1. No evidence of acute abnormality or foreign body. Electronically signed by: Gilmore Molt MD MD 05/16/2024 11:12 PM EST RP Workstation: [id number]     Procedures   Medications Ordered in the ED - No data to display                                  Medical Decision Making Adult male presents with foreign body sensation.  Given the passage of days with no decompensation, and no neck, chest pain, little evidence or suspicion for foreign body in the neck, chest, respiratory compromise, bacteremia, sepsis. Oral exam without obvious foreign body, x-rays performed.  Amount and/or Complexity of Data Reviewed External Data Reviewed: notes. Labs:  Decision-making details documented in ED Course. Radiology: ordered and independent interpretation performed. Decision-making details documented in ED Course.  Labs noncontributory 11:26 PM On repeat exam patient awake, alert, in no distress.  We discussed the CT which I have reviewed.  No foreign body, and as above with reassuring physical exam, though there may be occult foreign body, symptoms more likely to foreign body sensation from minor trauma.  Patient will follow-up with GI as needed.  Discharged in stable condition.     Final diagnoses:  Foreign body sensation in throat    ED Discharge Orders     None          Garrick Charleston, MD 05/16/24 2326  "

## 2024-05-16 NOTE — ED Triage Notes (Signed)
 Pt reports for two days he's felt as though he has a fishbone imbedded in the L side of his tongue. Denies globus sensation. No difficulty managing secretions per pt.

## 2024-05-16 NOTE — ED Triage Notes (Signed)
 Pt ambulatory to triage with c/o feeling like a fishbone stuck on throat for last 2 days.  Able to control own secretions.

## 2024-05-16 NOTE — Discharge Instructions (Addendum)
 If you continue to have the sensation of something in your tongue on Monday, call our gastroenterology colleagues for an assessment.  Return here for concerning changes in your condition.

## 2024-05-22 ENCOUNTER — Encounter: Admitting: Family Medicine

## 2024-05-23 ENCOUNTER — Encounter: Payer: Self-pay | Admitting: Family Medicine

## 2024-05-23 ENCOUNTER — Ambulatory Visit: Admitting: Family Medicine

## 2024-05-23 VITALS — BP 150/80 | HR 68 | Temp 98.0°F | Wt 178.2 lb

## 2024-05-23 DIAGNOSIS — I10 Essential (primary) hypertension: Secondary | ICD-10-CM

## 2024-05-23 DIAGNOSIS — Z0000 Encounter for general adult medical examination without abnormal findings: Secondary | ICD-10-CM | POA: Diagnosis not present

## 2024-05-23 DIAGNOSIS — Z23 Encounter for immunization: Secondary | ICD-10-CM | POA: Diagnosis not present

## 2024-05-23 DIAGNOSIS — E785 Hyperlipidemia, unspecified: Secondary | ICD-10-CM | POA: Diagnosis not present

## 2024-05-23 DIAGNOSIS — Z125 Encounter for screening for malignant neoplasm of prostate: Secondary | ICD-10-CM

## 2024-05-23 DIAGNOSIS — Z131 Encounter for screening for diabetes mellitus: Secondary | ICD-10-CM

## 2024-05-23 LAB — LIPID PANEL
Cholesterol: 110 mg/dL (ref 28–200)
HDL: 55.3 mg/dL
LDL Cholesterol: 45 mg/dL (ref 10–99)
NonHDL: 54.67
Total CHOL/HDL Ratio: 2
Triglycerides: 46 mg/dL (ref 10.0–149.0)
VLDL: 9.2 mg/dL (ref 0.0–40.0)

## 2024-05-23 LAB — HEPATIC FUNCTION PANEL
ALT: 10 U/L (ref 3–53)
AST: 14 U/L (ref 5–37)
Albumin: 4.1 g/dL (ref 3.5–5.2)
Alkaline Phosphatase: 64 U/L (ref 39–117)
Bilirubin, Direct: 0.2 mg/dL (ref 0.1–0.3)
Total Bilirubin: 0.6 mg/dL (ref 0.2–1.2)
Total Protein: 7.1 g/dL (ref 6.0–8.3)

## 2024-05-23 LAB — PSA, MEDICARE: PSA: 2.19 ng/mL (ref 0.10–4.00)

## 2024-05-23 LAB — HEMOGLOBIN A1C: Hgb A1c MFr Bld: 6 % (ref 4.6–6.5)

## 2024-05-23 MED ORDER — AMLODIPINE BESYLATE 5 MG PO TABS: 5.0000 mg | ORAL_TABLET | Freq: Every day | ORAL | 3 refills | Status: AC

## 2024-05-23 NOTE — Progress Notes (Signed)
 "  Complete physical exam  Patient: Luis Reed   DOB: 05-28-1960   64 y.o. Male  MRN: [id number]  Subjective:    Chief Complaint  Patient presents with   Hospitalization Follow-up    Luis Reed is a 64 y.o. male who presents today for a complete physical exam. He reports consuming a general diet.  Daily fruits veggies, cooks most of his meals, gets protein, does drink soda. Home exercise routine includes walking 2-3 hrs per week. He generally feels well. He reports sleeping poorly, gets about 6-7 hours. He does have additional problems to discuss today.   Discussed the use of AI scribe software for clinical note transcription with the patient, who gave verbal consent to proceed.  History of Present Illness   Luis Reed is a 64 year old male who presents with a foreign body sensation in the throat after eating fish.  He developed a foreign body sensation in the back of his throat after eating fish when a small bone lodged in his tongue. He tried eating bread without relief and went to the emergency room, where x-rays did not show a foreign body. The sensation persisted with eating and swallowing but has fully resolved by today. He has had no trouble breathing, fevers, or chills.  He takes amlodipine  5 mg daily for blood pressure and Lipitor for cholesterol. His home blood pressure readings are usually around 130 systolic with low diastolic values. He occasionally takes trazodone  for sleep.  He lives with his three teenage children and is physically active with regular walking and biking. His diet includes daily fruits, vegetables, and adequate protein, and he avoids soda and processed foods. He sleeps about six to seven hours per night with poor sleep quality and sometimes uses trazodone . He received a flu shot, COVID booster, and pneumonia vaccine last year.  He denies trouble breathing, fevers, chills, urinary issues, or bowel changes. Bowel movements are regular.        Most recent fall risk assessment:    04/11/2024    8:11 AM  Fall Risk   Falls in the past year? 0  Number falls in past yr: 0  Injury with Fall? 0  Risk for fall due to : Medication side effect  Follow up Falls prevention discussed;Falls evaluation completed     Most recent depression screenings:    04/11/2024    8:08 AM 12/18/2023    4:20 PM  PHQ 2/9 Scores  PHQ - 2 Score 0 0  PHQ- 9 Score 0 1      Data saved with a previous flowsheet row definition    Vision:Within last year and Dental: No current dental problems and Last dental visit: 1 year ago  Patient Active Problem List   Diagnosis Date Noted   Hyperlipidemia 06/26/2023   HTN (hypertension) 06/26/2023   Renal transplant recipient 04/19/2023   COVID-19 virus infection 05/20/2019   Malignant hyperthermia    Other specified anxiety disorders 07/18/2014   RA (retrograde amnesia) 07/18/2014   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   Essential (primary) hypertension 06/06/2011   KIDNEY TRANSPLANTATION 07/11/2007   Hepatitis C virus infection without hepatic coma 03/15/2006   SYPHILIS 03/15/2006   HYDROCELE NOS 03/15/2006   ALCOHOL ABUSE, HX OF 03/15/2006   HEPATITIS B, HX OF 03/15/2006   TOBACCO USE, QUIT 03/15/2006   PARATHYROIDECTOMY 08/08/2002   CEREBROVASCULAR ACCIDENT, ACUTE 10/08/1999   Human immunodeficiency virus (HIV) disease (HCC) 05/10/1999      Patient  Care Team: Ozell Heron HERO, MD as PCP - General (Family Medicine) Rayburn Pac, MD (Inactive) as Consulting Physician (Nephrology)   Show/hide medication list[1]  Review of Systems  HENT:  Negative for hearing loss.   Eyes:  Negative for blurred vision.  Respiratory:  Negative for shortness of breath.   Cardiovascular:  Negative for chest pain.  Gastrointestinal: Negative.   Genitourinary: Negative.   Musculoskeletal:  Negative for back pain.  Neurological:  Negative for headaches.  Psychiatric/Behavioral:  Negative for  depression.        Objective:     BP (!) 150/80   Pulse 68   Temp 98 F (36.7 C) (Oral)   Wt 178 lb 3.2 oz (80.8 kg)   SpO2 94%   BMI 27.10 kg/m    Physical Exam Vitals reviewed.  Constitutional:      Appearance: Normal appearance. He is well-groomed and normal weight.  HENT:     Right Ear: Tympanic membrane and ear canal normal.     Left Ear: Tympanic membrane and ear canal normal.     Mouth/Throat:     Mouth: Mucous membranes are moist.     Pharynx: No posterior oropharyngeal erythema.  Eyes:     Extraocular Movements: Extraocular movements intact.     Conjunctiva/sclera: Conjunctivae normal.  Neck:     Thyroid : No thyromegaly.  Cardiovascular:     Rate and Rhythm: Normal rate and regular rhythm.     Heart sounds: S1 normal and S2 normal. No murmur heard. Pulmonary:     Effort: Pulmonary effort is normal.     Breath sounds: Normal breath sounds and air entry. No rales.  Abdominal:     General: Abdomen is flat. Bowel sounds are normal.  Musculoskeletal:     Right lower leg: No edema.     Left lower leg: No edema.  Lymphadenopathy:     Cervical: No cervical adenopathy.  Neurological:     General: No focal deficit present.     Mental Status: He is alert and oriented to person, place, and time.     Gait: Gait is intact.  Psychiatric:        Mood and Affect: Mood and affect normal.      No results found for any visits on 05/23/24.     Assessment & Plan:    Routine Health Maintenance and Physical Exam  Immunization History  Administered Date(s) Administered   INFLUENZA, HIGH DOSE SEASONAL PF 05/23/2024   Influenza Split 02/10/2011, 01/08/2012   Influenza Whole 02/06/2005, 03/14/2006, 02/09/2009, 02/09/2010   Influenza, Seasonal, Injecte, Preservative Fre 05/19/2023   Influenza,inj,Quad PF,6+ Mos 01/17/2013, 01/16/2014, 04/23/2015, 02/20/2020, 03/25/2021   Influenza-Unspecified 04/08/2005, 07/26/2005, 02/07/2007, 03/10/2019, 02/06/2022   Janssen (J&J)  SARS-COV-2 Vaccination 07/08/2019, 08/16/2019   PNEUMOCOCCAL CONJUGATE-20 06/27/2023   Pfizer Covid-19 Vaccine Bivalent Booster 55yrs & up 03/25/2021   Pfizer(Comirnaty)Fall Seasonal Vaccine 12 years and older 06/27/2023   Pneumococcal Polysaccharide-23 07/26/2005, 10/18/2005, 02/10/2011   Pneumococcal-Unspecified 02/25/2005    Health Maintenance  Topic Date Due   DTaP/Tdap/Td (1 - Tdap) Never done   Zoster Vaccines- Shingrix  (1 of 2) Never done   Hepatitis B Vaccines 19-59 Average Risk (1 of 3 - Risk 3-dose series) Never done   COVID-19 Vaccine (5 - 2025-26 season) 01/08/2024   Medicare Annual Wellness (AWV)  04/11/2025   Colonoscopy  04/17/2027   Pneumococcal Vaccine: 50+ Years  Completed   Influenza Vaccine  Completed   Hepatitis C Screening  Completed   HIV Screening  Completed   Meningococcal B Vaccine  Aged Out   Fecal DNA (Cologuard)  Discontinued    Discussed health benefits of physical activity, and encouraged him to engage in regular exercise appropriate for his age and condition.  Routine adult health maintenance General physical exam findings are normal today. I reviewed the patient's preventative testing, immunizations, and lifestyle habits. I made appropriate recommendations and placed orders for the appropriate tests and/or vaccinations. I counseled the patient on the CDC's recommendations for healthy exercise and diet. I counseled the patient on healthy sleep habits and stress management. Handouts to reinforce the counseling were given at the conclusion of the visit.   Hyperlipidemia, unspecified hyperlipidemia type -     Lipid panel; Future  Diabetes mellitus screening -     Hemoglobin A1c; Future  Prostate cancer screening -     PSA, Medicare; Future  Essential (primary) hypertension -     amLODIPine  Besylate; Take 1 tablet (5 mg total) by mouth daily.  Dispense: 90 tablet; Refill: 3  Immunization due -     Flu vaccine HIGH DOSE PF(Fluzone  Trivalent)  Assessment and Plan    Essential hypertension Blood pressure is elevated today, but home readings are typically in the 130s. Possible white coat hypertension. Previous readings at other facilities have been variable. - Continue amlodipine  5 mg daily - Monitor blood pressure at home daily - Contact provider if home blood pressure readings are consistently elevated  Hyperlipidemia Currently managed with Lipitor. Due for cholesterol testing as part of annual maintenance. - Ordered cholesterol test - Refilled Lipitor prescription  Kidney transplant status Kidney function is well-managed with creatinine at 1.7. No issues with urination or swelling. Regular follow-ups with transplant team every six months. - Continue regular follow-ups with transplant team  Insomnia Reports poor sleep quality, sleeping 6-7 hours per night. Occasionally uses trazodone  for sleep. - Continue trazodone  as needed for sleep  General Health Maintenance Due for annual physical. Labs needed for cholesterol, liver function, A1c, and PSA screening. Up to date on colonoscopy and vaccinations except for flu shot. - Ordered A1c test - Ordered liver function test - Ordered PSA test - Administered high-dose flu shot - Scheduled blood work        Return in about 1 year (around 05/23/2025) for annual physical exam.     Heron CHRISTELLA Sharper, MD      [1]  Outpatient Medications Prior to Visit  Medication Sig   atorvastatin  (LIPITOR) 10 MG tablet Take 1 tablet (10 mg total) by mouth daily.   calcitRIOL (ROCALTROL) 0.5 MCG capsule Take 0.5 mcg by mouth at bedtime.   CELLCEPT 250 MG capsule Take 250 mg by mouth 2 (two) times daily.   dolutegravir  (TIVICAY ) 50 MG tablet Take 1 tablet (50 mg total) by mouth daily.   emtricitabine -tenofovir  AF (DESCOVY ) 200-25 MG tablet Take 1 tablet by mouth daily.   methocarbamol  (ROBAXIN ) 500 MG tablet Take 1 tablet (500 mg total) by mouth at bedtime as needed for  muscle spasms.   neomycin -polymyxin-hydrocortisone (CORTISPORIN) 3.5-10000-1 OTIC suspension APPLY 1-2 DROPS DAILY AFTER SOAKING AND COVER WITH BANDAID   predniSONE  (DELTASONE ) 5 MG tablet Take 5 mg by mouth every Monday, Wednesday, and Friday at 8 PM.    predniSONE  (STERAPRED UNI-PAK 21 TAB) 10 MG (21) TBPK tablet Take as directed   tacrolimus  (PROGRAF ) 1 MG capsule Take 2 mg by mouth 2 (two) times daily.    tacrolimus  (PROGRAF ) 5 MG capsule Take 5 mg by mouth 2 (  two) times daily.    traZODone  (DESYREL ) 50 MG tablet TAKE 0.5-1 TABLETS BY MOUTH AT BEDTIME AS NEEDED FOR SLEEP.   [DISCONTINUED] amLODipine  (NORVASC ) 5 MG tablet Take 1 tablet (5 mg total) by mouth daily.   No facility-administered medications prior to visit.   "

## 2024-05-27 ENCOUNTER — Ambulatory Visit: Payer: Self-pay | Admitting: Family Medicine

## 2024-06-14 ENCOUNTER — Telehealth: Payer: Self-pay

## 2024-06-14 ENCOUNTER — Other Ambulatory Visit (HOSPITAL_COMMUNITY): Payer: Self-pay

## 2024-06-14 ENCOUNTER — Other Ambulatory Visit: Payer: Self-pay

## 2024-06-14 NOTE — Progress Notes (Signed)
 Benefits investigation completed, patient advocate was able to secure grant a for the patient.

## 2024-06-14 NOTE — Telephone Encounter (Signed)
 [facility] Patient Advocate Encounter   I was successful in securing patient a $5000 grant from [employer] ([employer]) to provide copayment coverage for DESCOVY .  This will make the out of pocket cost $0.     I have spoken with the patient.    The billing information is as follows and has been shared with [facility].   RxBin: [id number] PCN:   [id number] Member ID: [id number] Group ID: [id number] Dates of Eligibility: 05/19/24 through 06/14/25    Patient knows to call the office with questions or concerns.  Charmaine Sharps, CPhT Specialty Pharmacy Patient [facility] Phone: [phone] Fax:  [phone]

## 2024-06-14 NOTE — Progress Notes (Signed)
 Specialty Pharmacy Refill Coordination Note  Luis Reed is a 64 y.o. male contacted today regarding refills of specialty medication(s) Dolutegravir  Sodium (Tivicay ); Emtricitabine -Tenofovir  AF (DESCOVY )   Patient requested Delivery   Delivery date: 06/17/24   Verified address: [address]  [name] [address]   Medication will be filled on: 06/14/24

## 2025-04-18 ENCOUNTER — Ambulatory Visit
